# Patient Record
Sex: Male | Born: 1950
Health system: Southern US, Community
[De-identification: ages and names within clinical notes are randomized; demographics above are authoritative.]

## PROBLEM LIST (undated history)

## (undated) DIAGNOSIS — E785 Hyperlipidemia, unspecified: Secondary | ICD-10-CM

## (undated) DIAGNOSIS — R7989 Other specified abnormal findings of blood chemistry: Secondary | ICD-10-CM

## (undated) DIAGNOSIS — R972 Elevated prostate specific antigen [PSA]: Secondary | ICD-10-CM

## (undated) DIAGNOSIS — F1010 Alcohol abuse, uncomplicated: Secondary | ICD-10-CM

## (undated) DIAGNOSIS — M5430 Sciatica, unspecified side: Secondary | ICD-10-CM

## (undated) DIAGNOSIS — D649 Anemia, unspecified: Secondary | ICD-10-CM

## (undated) DIAGNOSIS — N529 Male erectile dysfunction, unspecified: Secondary | ICD-10-CM

## (undated) DIAGNOSIS — I10 Essential (primary) hypertension: Secondary | ICD-10-CM

## (undated) DIAGNOSIS — K219 Gastro-esophageal reflux disease without esophagitis: Secondary | ICD-10-CM

## (undated) DIAGNOSIS — K279 Peptic ulcer, site unspecified, unspecified as acute or chronic, without hemorrhage or perforation: Secondary | ICD-10-CM

## (undated) DIAGNOSIS — C61 Malignant neoplasm of prostate: Secondary | ICD-10-CM

## (undated) HISTORY — DX: Elevated prostate specific antigen (PSA): R97.20

## (undated) HISTORY — DX: Essential (primary) hypertension: I10

## (undated) HISTORY — DX: Male erectile dysfunction, unspecified: N52.9

## (undated) HISTORY — DX: Hyperlipidemia, unspecified: E78.5

## (undated) HISTORY — PX: PROSTATE BIOPSY: SHX241

---

## 1970-05-25 HISTORY — PX: TONSILLECTOMY: SUR1361

## 2003-05-26 DIAGNOSIS — N529 Male erectile dysfunction, unspecified: Secondary | ICD-10-CM | POA: Insufficient documentation

## 2003-05-26 HISTORY — PX: NASAL FRACTURE SURGERY: SHX718

## 2004-05-25 DIAGNOSIS — E782 Mixed hyperlipidemia: Secondary | ICD-10-CM | POA: Insufficient documentation

## 2004-05-25 DIAGNOSIS — E785 Hyperlipidemia, unspecified: Secondary | ICD-10-CM | POA: Insufficient documentation

## 2007-07-20 DIAGNOSIS — I10 Essential (primary) hypertension: Secondary | ICD-10-CM | POA: Insufficient documentation

## 2009-02-07 DIAGNOSIS — T6391XA Toxic effect of contact with unspecified venomous animal, accidental (unintentional), initial encounter: Secondary | ICD-10-CM | POA: Insufficient documentation

## 2009-02-26 DIAGNOSIS — D649 Anemia, unspecified: Secondary | ICD-10-CM | POA: Insufficient documentation

## 2013-04-24 ENCOUNTER — Ambulatory Visit: Payer: Self-pay | Admitting: Family Medicine

## 2014-10-30 LAB — CBC AND DIFFERENTIAL
HEMATOCRIT: 36 % — AB (ref 41–53)
Hemoglobin: 12.2 g/dL — AB (ref 13.5–17.5)
Platelets: 275 10*3/uL (ref 150–399)
WBC: 5.2 10^3/mL

## 2014-10-30 LAB — BASIC METABOLIC PANEL
BUN: 11 mg/dL (ref 4–21)
CREATININE: 0.9 mg/dL (ref <=1.3)
GLUCOSE: 97 mg/dL
POTASSIUM: 4.8 mmol/L (ref 3.4–5.3)
Sodium: 140 mmol/L (ref 137–147)

## 2014-10-30 LAB — LIPID PANEL
CHOLESTEROL: 217 mg/dL — AB (ref 0–200)
HDL: 85 mg/dL — AB (ref 35–70)
LDL Cholesterol: 111 mg/dL
TRIGLYCERIDES: 106 mg/dL (ref 40–160)

## 2014-11-02 ENCOUNTER — Telehealth: Payer: Self-pay | Admitting: *Deleted

## 2014-11-02 NOTE — Telephone Encounter (Signed)
Informed pt as below. Jason Holmes, CMA  

## 2014-11-02 NOTE — Telephone Encounter (Signed)
LMOVM for pt to return call 

## 2014-11-02 NOTE — Telephone Encounter (Signed)
-----   Message from Birdie Sons, MD sent at 11/01/2014 11:14 AM EDT ----- Regarding: Lab results Cholesterol is pretty well controlled at 217. Blood count kidney functions, and blood sugar are all good. Continue current medications. Check labs yearly.

## 2014-11-19 ENCOUNTER — Other Ambulatory Visit: Payer: Self-pay | Admitting: Family Medicine

## 2014-11-29 ENCOUNTER — Other Ambulatory Visit: Payer: Self-pay | Admitting: Family Medicine

## 2015-01-07 ENCOUNTER — Other Ambulatory Visit: Payer: Self-pay | Admitting: *Deleted

## 2015-01-07 NOTE — Telephone Encounter (Signed)
Received fax requesting rx's be resent to [facility].

## 2015-01-08 NOTE — Telephone Encounter (Signed)
Pt called you back. Pt confirmed that he does want to use [facility] mail order.  If you have any question please call him back.  Thanks, [employer]

## 2015-01-09 MED ORDER — SIMVASTATIN 20 MG PO TABS: 20.0000 mg | ORAL_TABLET | Freq: Every day | ORAL | Status: DC

## 2015-01-09 MED ORDER — HYDROCHLOROTHIAZIDE 25 MG PO TABS: 25.0000 mg | ORAL_TABLET | Freq: Every day | ORAL | Status: DC

## 2015-01-10 ENCOUNTER — Other Ambulatory Visit: Payer: Self-pay | Admitting: *Deleted

## 2015-01-10 MED ORDER — SIMVASTATIN 20 MG PO TABS: 20.0000 mg | ORAL_TABLET | Freq: Every day | ORAL | Status: DC

## 2015-01-10 MED ORDER — HYDROCHLOROTHIAZIDE 25 MG PO TABS: 25.0000 mg | ORAL_TABLET | Freq: Every day | ORAL | Status: DC

## 2015-01-10 NOTE — Telephone Encounter (Signed)
Per pt's request, resent rx's to [facility] mail order. rx's sent to [facility] were canceled.

## 2015-05-24 ENCOUNTER — Other Ambulatory Visit: Payer: Self-pay | Admitting: Family Medicine

## 2015-05-29 ENCOUNTER — Telehealth: Payer: Self-pay | Admitting: Family Medicine

## 2015-05-29 NOTE — Telephone Encounter (Signed)
Left detailed message on vm informing pt that request was received and rx was sent to [facility].

## 2015-05-29 NOTE — Telephone Encounter (Addendum)
Pt calling stating that [facility] faxed over a form for his Simvastatin and pt stating that [facility] hasn't heard back from his Dr about this and pt is wanting to know if Dr has even received it and is done with it.  Pt would like a return call on this please. CB# [phone].  Pt stated that we could leave a message on phone Thanks CC

## 2015-06-03 ENCOUNTER — Other Ambulatory Visit: Payer: Self-pay | Admitting: Family Medicine

## 2015-07-24 ENCOUNTER — Other Ambulatory Visit: Payer: Self-pay | Admitting: Family Medicine

## 2015-08-08 ENCOUNTER — Encounter: Payer: Self-pay | Admitting: Family Medicine

## 2015-08-08 ENCOUNTER — Ambulatory Visit (INDEPENDENT_AMBULATORY_CARE_PROVIDER_SITE_OTHER): Payer: Medicare PPO | Admitting: Family Medicine

## 2015-08-08 VITALS — BP 106/72 | HR 70 | Temp 98.6°F | Resp 16 | Wt 170.4 lb

## 2015-08-08 DIAGNOSIS — H6121 Impacted cerumen, right ear: Secondary | ICD-10-CM | POA: Diagnosis not present

## 2015-08-08 DIAGNOSIS — R002 Palpitations: Secondary | ICD-10-CM | POA: Insufficient documentation

## 2015-08-08 DIAGNOSIS — M5430 Sciatica, unspecified side: Secondary | ICD-10-CM | POA: Insufficient documentation

## 2015-08-08 NOTE — Patient Instructions (Signed)
Discussed minimal use of Q-tips.

## 2015-08-08 NOTE — Progress Notes (Signed)
Subjective:     Patient ID: Jason Holmes, male   DOB: 08-16-1950, 65 y.o.   MRN: [id number]  HPI  Chief Complaint  Patient presents with  . Ear Problem    Patient comes into office today with concerns of decreased hearing for the past 30 days in his right ear. Patient states that "it feels like water is in my ear," patient denies ringing, itching or pain in ear.   Denies precedent cold or allergy sx:" I feel like there is a bubble in my ear."   Review of Systems     Objective:   Physical Exam  Constitutional: He appears well-developed and well-nourished. No distress.  HENT:  Left ear with small amount of cerumen, TM intact Right ear with cerumen impaction: After irrigation per Kat ear canals are patent and T.M's intact. Patient reports improvement in his hearing.       Assessment:    1. Cerumen impaction, right - EAR CERUMEN REMOVAL    Plan:    Discussed minimal use of Q-tips.

## 2015-10-14 ENCOUNTER — Other Ambulatory Visit: Payer: Self-pay | Admitting: Family Medicine

## 2015-11-27 LAB — POC HEMOCCULT BLD/STL (HOME/3-CARD/SCREEN)

## 2015-12-22 ENCOUNTER — Other Ambulatory Visit: Payer: Self-pay | Admitting: Family Medicine

## 2015-12-25 ENCOUNTER — Encounter: Payer: Self-pay | Admitting: Family Medicine

## 2016-01-21 ENCOUNTER — Other Ambulatory Visit: Payer: Self-pay | Admitting: Family Medicine

## 2016-03-10 ENCOUNTER — Telehealth: Payer: Self-pay | Admitting: Family Medicine

## 2016-03-10 NOTE — Telephone Encounter (Signed)
Called Pt to schedule AWV with [facility]  for 10/26 - knb

## 2016-03-18 ENCOUNTER — Ambulatory Visit: Payer: Medicare PPO

## 2016-03-26 ENCOUNTER — Ambulatory Visit (INDEPENDENT_AMBULATORY_CARE_PROVIDER_SITE_OTHER): Payer: Medicare PPO | Admitting: Family Medicine

## 2016-03-26 VITALS — BP 115/64 | HR 60 | Temp 99.3°F | Ht <= 58 in | Wt 173.4 lb

## 2016-03-26 DIAGNOSIS — Z23 Encounter for immunization: Secondary | ICD-10-CM

## 2016-03-26 DIAGNOSIS — Z0000 Encounter for general adult medical examination without abnormal findings: Secondary | ICD-10-CM

## 2016-03-26 NOTE — Patient Instructions (Signed)
Mr. Duque , Thank you for taking time to come for your Medicare Wellness Visit. I appreciate your ongoing commitment to your health goals. Please review the following plan we discussed and let me know if I can assist you in the future.   These are the goals we discussed: Goals    . exercise/water          Starting 03/26/16, I will continue to exercise daily and drink 6-8 glasses of water a day.       This is a list of the screening recommended for you and due dates:  Health Maintenance  Topic Date Due  . Colon Cancer Screening  05/24/2016*  .  Hepatitis C: One time screening is recommended by Center for Disease Control  (CDC) for  adults born from [birth year] through 1967.   05/24/2016*  . Tetanus Vaccine  03/26/2026*  . Pneumonia vaccines (2 of 2 - PPSV23) 03/26/2017  . Flu Shot  Completed  . Shingles Vaccine  Completed  *Topic was postponed. The date shown is not the original due date.   Preventive Care for Adults  A healthy lifestyle and preventive care can promote health and wellness. Preventive health guidelines for adults include the following key practices.  . A routine yearly physical is a good way to check with your health care provider about your health and preventive screening. It is a chance to share any concerns and updates on your health and to receive a thorough exam.  . Visit your dentist for a routine exam and preventive care every 6 months. Brush your teeth twice a day and floss once a day. Good oral hygiene prevents tooth decay and gum disease.  . The frequency of eye exams is based on your age, health, family medical history, use  of contact lenses, and other factors. Follow your health care provider's ecommendations for frequency of eye exams.  . Eat a healthy diet. Foods like vegetables, fruits, whole grains, low-fat dairy products, and lean protein foods contain the nutrients you need without too many calories. Decrease your intake of foods high in solid fats,  added sugars, and salt. Eat the right amount of calories for you. Get information about a proper diet from your health care provider, if necessary.  . Regular physical exercise is one of the most important things you can do for your health. Most adults should get at least 150 minutes of moderate-intensity exercise (any activity that increases your heart rate and causes you to sweat) each week. In addition, most adults need muscle-strengthening exercises on 2 or more days a week.  Silver Sneakers may be a benefit available to you. To determine eligibility, you may visit the website: www.silversneakers.com or contact program at [phone] Mon-Fri between 8AM-8PM.   . Maintain a healthy weight. The body mass index (BMI) is a screening tool to identify possible weight problems. It provides an estimate of body fat based on height and weight. Your health care provider can find your BMI and can help you achieve or maintain a healthy weight.   For adults 20 years and older: ? A BMI below 18.5 is considered underweight. ? A BMI of 18.5 to 24.9 is normal. ? A BMI of 25 to 29.9 is considered overweight. ? A BMI of 30 and above is considered obese.   . Maintain normal blood lipids and cholesterol levels by exercising and minimizing your intake of saturated fat. Eat a balanced diet with plenty of fruit and vegetables. Blood tests for lipids  and cholesterol should begin at age 65 and be repeated every 5 years. If your lipid or cholesterol levels are high, you are over 50, or you are at high risk for heart disease, you may need your cholesterol levels checked more frequently. Ongoing high lipid and cholesterol levels should be treated with medicines if diet and exercise are not working.  . If you smoke, find out from your health care provider how to quit. If you do not use tobacco, please do not start.  . If you choose to drink alcohol, please do not consume more than 2 drinks per day. One drink is  considered to be 12 ounces (355 mL) of beer, 5 ounces (148 mL) of wine, or 1.5 ounces (44 mL) of liquor.  . If you are 31-68 years old, ask your health care provider if you should take aspirin to prevent strokes.  . Use sunscreen. Apply sunscreen liberally and repeatedly throughout the day. You should seek shade when your shadow is shorter than you. Protect yourself by wearing Connelly sleeves, pants, a wide-brimmed hat, and sunglasses year round, whenever you are outdoors.  . Once a month, do a whole body skin exam, using a mirror to look at the skin on your back. Tell your health care provider of new moles, moles that have irregular borders, moles that are larger than a pencil eraser, or moles that have changed in shape or color.

## 2016-03-26 NOTE — Progress Notes (Signed)
Subjective:   Jason Holmes is a 65 y.o. male who presents for Medicare Annual/Subsequent preventive examination.  Review of Systems:  N/A Cardiac Risk Factors include: advanced age (>46men, >73 women);dyslipidemia;hypertension;male gender;smoking/ tobacco exposure     Objective:    Vitals: BP 115/64 (BP Location: Right Arm)   Pulse 60   Temp 99.3 F (37.4 C) (Oral)   Ht 4' 7.75" (1.416 m)   Wt 173 lb 6 oz (78.6 kg)   BMI 39.22 kg/m   Body mass index is 39.22 kg/m.  Tobacco History  Smoking Status  . 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 Erectile dysfunction   . Hyperlipidemia   . Hypertension    History reviewed. No pertinent surgical history. Family History  Problem Relation Age of Onset  . Heart attack Mother   . Heart attack Father    History  Sexual Activity  . Sexual activity: Not on file    Outpatient Encounter Prescriptions as of 03/26/2016  Medication Sig  . aspirin 81 MG tablet   . Glucosamine HCl 1000 MG TABS Take by mouth.  . hydrochlorothiazide (HYDRODIURIL) 25 MG tablet TAKE 1 TABLET EVERY DAY  . MULTIPLE VITAMIN PO Take by mouth daily.  . sildenafil (VIAGRA) 25 MG tablet Take 25 mg by mouth daily as needed for erectile dysfunction.  . simvastatin (ZOCOR) 20 MG tablet TAKE 1 TABLET AT BEDTIME   No facility-administered encounter medications on file as of 03/26/2016.     Activities of Daily Living In your present state of health, do you have any difficulty performing the following activities: 03/26/2016  Hearing? Y  Vision? N  Difficulty concentrating or making decisions? N  Walking or climbing stairs? N  Dressing or bathing? N  Doing errands, shopping? N  Preparing Food and eating ? N  Using the Toilet? N  In the past six months, have you accidently leaked urine? N  Do you have problems with loss of  bowel control? N  Managing your Medications? N  Managing your Finances? N  Housekeeping or managing your Housekeeping? N  Some recent data might be hidden    Patient Care Team: Birdie Sons, MD as PCP - General (Family Medicine) Allyn Kenner, MD as Consulting Physician (Dermatology)   Assessment:     Exercise Activities and Dietary recommendations Current Exercise Habits: Home exercise routine, Type of exercise: walking, Time (Minutes): 30, Frequency (Times/Week): 7, Weekly Exercise (Minutes/Week): 210, Intensity: Mild  Goals    . exercise/water          Starting 03/26/16, I will continue to exercise daily and drink 6-8 glasses of water a day.      Fall Risk Fall Risk  03/26/2016  Falls in the past year? No   Depression Screen PHQ 2/9 Scores 03/26/2016  PHQ - 2 Score 0    Cognitive Function     6CIT Screen 03/26/2016  What Year? 0 points  What month? 0 points  What time? 0 points  Count back from 20 0 points  Months in reverse 0 points  Repeat phrase 2 points  Total Score 2    There is no immunization history for the selected administration types on file for this patient. Screening Tests Health Maintenance  Topic Date Due  . COLONOSCOPY  05/24/2016 (Originally 01/04/1999)  . Hepatitis C  Screening  05/24/2016 (Originally [birth year])  . TETANUS/TDAP  03/26/2026 (Originally 01/04/1968)  . PNA vac Low Risk Adult (2 of 2 - PPSV23) 03/26/2017  . INFLUENZA VACCINE  Completed  . ZOSTAVAX  Completed      I have personally reviewed and addressed the Medicare Annual Wellness questionnaire and have noted the following in the patient's chart:  A. Medical and social history B. Use of alcohol, tobacco or illicit drugs  C. Current medications and supplements D. Functional ability and status E.  Nutritional status F.  Physical activity G. Advance directives H. List of other physicians I.  Hospitalizations, surgeries, and ER visits in previous 12 months J.   [place] such as hearing and vision if needed, cognitive and depression L. Referrals and appointments - none  In addition, I have reviewed and discussed with patient certain preventive protocols, quality metrics, and best practice recommendations. A written personalized care plan for preventive services as well as general preventive health recommendations were provided to patient.  See attached scanned questionnaire for additional information.   Signed,  Fabio Neighbors, LPN Nurse Health Advisor  I have reviewed the health advisor's note, was available for consultation, and agree with documentation and plan  Lelon Huh, MD

## 2016-03-27 DIAGNOSIS — Z23 Encounter for immunization: Secondary | ICD-10-CM | POA: Diagnosis not present

## 2016-04-27 ENCOUNTER — Other Ambulatory Visit: Payer: Self-pay | Admitting: Family Medicine

## 2016-04-29 ENCOUNTER — Other Ambulatory Visit: Payer: Self-pay | Admitting: *Deleted

## 2016-04-29 MED ORDER — SILDENAFIL CITRATE 100 MG PO TABS: 100.0000 mg | ORAL_TABLET | Freq: Every day | ORAL | 4 refills | Status: DC | PRN

## 2016-04-30 ENCOUNTER — Other Ambulatory Visit: Payer: Self-pay | Admitting: Family Medicine

## 2016-04-30 ENCOUNTER — Telehealth: Payer: Self-pay | Admitting: Family Medicine

## 2016-05-06 ENCOUNTER — Encounter: Payer: Self-pay | Admitting: Family Medicine

## 2016-05-06 ENCOUNTER — Ambulatory Visit (INDEPENDENT_AMBULATORY_CARE_PROVIDER_SITE_OTHER): Payer: Medicare PPO | Admitting: Family Medicine

## 2016-05-06 VITALS — BP 120/64 | HR 72 | Temp 98.6°F | Resp 16 | Wt 172.0 lb

## 2016-05-06 DIAGNOSIS — Z0000 Encounter for general adult medical examination without abnormal findings: Secondary | ICD-10-CM | POA: Diagnosis not present

## 2016-05-06 DIAGNOSIS — Z125 Encounter for screening for malignant neoplasm of prostate: Secondary | ICD-10-CM

## 2016-05-06 DIAGNOSIS — F1010 Alcohol abuse, uncomplicated: Secondary | ICD-10-CM | POA: Diagnosis not present

## 2016-05-06 DIAGNOSIS — Z1159 Encounter for screening for other viral diseases: Secondary | ICD-10-CM

## 2016-05-06 DIAGNOSIS — I10 Essential (primary) hypertension: Secondary | ICD-10-CM

## 2016-05-06 DIAGNOSIS — E782 Mixed hyperlipidemia: Secondary | ICD-10-CM

## 2016-05-06 NOTE — Patient Instructions (Addendum)
Please limit alcohol consumption to an average of no more than two servings per day  You will be due for colon screening in July 2018. I would recommend ColoGuard which is a stool test that only has to be done every three years. You can call our office in July and we will order it for you.    Alcohol Overuse  Alcohol use disorder is when your drinking disrupts your daily life. When you have this condition, you drink too much alcohol and you cannot control your drinking. Alcohol use disorder can cause serious problems with your physical health. It can affect your brain, heart, liver, pancreas, immune system, stomach, and intestines. Alcohol use disorder can increase your risk for certain cancers and cause problems with your mental health, such as depression, anxiety, psychosis, delirium, and dementia. People with this disorder risk hurting themselves and others. What are the causes? This condition is caused by drinking too much alcohol over time. It is not caused by drinking too much alcohol only one or two times. Some people with this condition drink alcohol to cope with or escape from negative life events. Others drink to relieve pain or symptoms of mental illness. What increases the risk? You are more likely to develop this condition if:  You have a family history of alcohol use disorder.  Your culture encourages drinking to the point of intoxication, or makes alcohol easy to get.  You had a mood or conduct disorder in childhood.  You have been a victim of abuse.  You are an adolescent and:  You have poor grades or difficulties in school.  Your caregivers do not talk to you about saying no to alcohol, or supervise your activities.  You are impulsive or you have trouble with self-control. What are the signs or symptoms? Symptoms of this condition include:  Drinkingmore than you want to.  Drinking for longer than you want to.  Trying several times to drink less or to control  your drinking.  Spending a lot of time getting alcohol, drinking, or recovering from drinking.  Craving alcohol.  Having problems at work, at school, or at home due to drinking.  Having problems in relationships due to drinking.  Drinking when it is dangerous to drink, such as before driving a car.  Continuing to drink even though you know you might have a physical or mental problem related to drinking.  Needing more and more alcohol to get the same effect you want from the alcohol (building up tolerance).  Having symptoms of withdrawal when you stop drinking. Symptoms of withdrawal include:  Fatigue.  Nightmares.  Trouble sleeping.  Depression.  Anxiety.  Fever.  Seizures.  Severe confusion.  Feeling or seeing things that are not there (hallucinations).  Tremors.  Rapid heart rate.  Rapid breathing.  High blood pressure.  Drinking to avoid symptoms of withdrawal. How is this diagnosed? This condition is diagnosed with an assessment. Your health care provider may start the assessment by asking three or four questions about your drinking. Your health care provider may perform a physical exam or do lab tests to see if you have physical problems resulting from alcohol use. She or he may refer you to a mental health professional for evaluation. How is this treated? Some people with alcohol use disorder are able to reduce their alcohol use to low-risk levels. Others need to completely quit drinking alcohol. When necessary, mental health professionals with specialized training in substance use treatment can help. Your health  care provider can help you decide how severe your alcohol use disorder is and what type of treatment you need. The following forms of treatment are available:  Detoxification. Detoxification involves quitting drinking and using prescription medicines within the first week to help lessen withdrawal symptoms. This treatment is important for people who  have had withdrawal symptoms before and for heavy drinkers who are likely to have withdrawal symptoms. Alcohol withdrawal can be dangerous, and in severe cases, it can cause death. Detoxification may be provided in a home, community, or primary care setting, or in a hospital or substance use treatment facility.  Counseling. This treatment is also called talk therapy. It is provided by substance use treatment counselors. A counselor can address the reasons you use alcohol and suggest ways to keep you from drinking again or to prevent problem drinking. The goals of talk therapy are to:  Find healthy activities and ways for you to cope with stress.  Identify and avoid the things that trigger your alcohol use.  Help you learn how to handle cravings.  Medicines.Medicines can help treat alcohol use disorder by:  Decreasing alcohol cravings.  Decreasing the positive feeling you have when you drink alcohol.  Causing an uncomfortable physical reaction when you drink alcohol (aversion therapy).  Support groups. Support groups are led by people who have quit drinking. They provide emotional support, advice, and guidance. These forms of treatment are often combined. Some people with this condition benefit from a combination of treatments provided by specialized substance use treatment centers. Follow these instructions at home:  Take over-the-counter and prescription medicines only as told by your health care provider.  Check with your health care provider before starting any new medicines.  Ask friends and family members not to offer you alcohol.  Avoid situations where alcohol is served, including gatherings where others are drinking alcohol.  Create a plan for what to do when you are tempted to use alcohol.  Find hobbies or activities that you enjoy that do not include alcohol.  Keep all follow-up visits as told by your health care provider. This is important. How is this prevented?  If  you drink, limit alcohol intake to no more than 1 drink a day for nonpregnant women and 2 drinks a day for men. One drink equals 12 oz of beer, 5 oz of wine, or 1 oz of hard liquor.  If you have a mental health condition, get treatment and support.  Do not give alcohol to adolescents.  If you are an adolescent:  Do not drink alcohol.  Do not be afraid to say no if someone offers you alcohol. Speak up about why you do not want to drink. You can be a positive role model for your friends and set a good example for those around you by not drinking alcohol.  If your friends drink, spend time with others who do not drink alcohol. Make new friends who do not use alcohol.  Find healthy ways to manage stress and emotions, such as meditation or deep breathing, exercise, spending time in nature, listening to music, or talking with a trusted friend or family member. Contact a health care provider if:  You are not able to take your medicines as told.  Your symptoms get worse.  You return to drinking alcohol (relapse) and your symptoms get worse. Get help right away if:  You have thoughts about hurting yourself or others. If you ever feel like you may hurt yourself or others, or have  thoughts about taking your own life, get help right away. You can go to your nearest emergency department or call:  Your local emergency services (911 in the [place]  A suicide crisis helpline, such as the [facility] at 253-632-8104. This is open 24 hours a day. Summary  Alcohol use disorder is when your drinking disrupts your daily life. When you have this condition, you drink too much alcohol and you cannot control your drinking.  Treatment may include detoxification, counseling, medicine, and support groups.  Ask friends and family members not to offer you alcohol. Avoid situations where alcohol is served.  Get help right away if you have thoughts about hurting yourself or  others. This information is not intended to replace advice given to you by your health care provider. Make sure you discuss any questions you have with your health care provider. Document Released: 06/18/2004 Document Revised: 02/06/2016 Document Reviewed: 02/06/2016 Elsevier Interactive Patient Education  2017 [employer].

## 2016-05-06 NOTE — Progress Notes (Signed)
Patient: Jason Holmes Male    DOB: 21-Jul-1950   65 y.o.   MRN: [id number] Visit Date: 05/06/2016  Today's Provider: Lelon Huh, MD   Chief Complaint  Patient presents with  . Annual Exam  . Hypertension  . Anemia   Subjective:    HPI  Patient present today for yearly physical and follow up of hypertension and high cholesterol. He feels well with no complaints at al. He exercises regularly, has a good appetitie, and has no trouble sleeping or resting at night.    Hypertension, follow-up:  BP Readings from Last 3 Encounters:  05/06/16 120/64  03/26/16 115/64  08/08/15 106/72    He was last seen for hypertension 10/16/2014.  BP at that visit was 110/82. Management since that visit includes; checked labs, no changes.He reports good compliance with treatment. He is not having side effects. none He is exercising. He is adherent to low salt diet.   Outside blood pressures are 120/80. He is experiencing none.  Patient denies none.   Cardiovascular risk factors include none.  Use of agents associated with hypertension: none.   ----------------------------------------------------------------    Lipid/Cholesterol, Follow-up:   Last seen for this 10/16/2014.  Management since that visit includes;checked labs, no changes.  Last Lipid Panel:    Component Value Date/Time   CHOL 217 (A) 10/30/2014   TRIG 106 10/30/2014   HDL 85 (A) 10/30/2014   LDLCALC 111 10/30/2014    He reports good compliance with treatment. He is not having side effects. none  Wt Readings from Last 3 Encounters:  05/06/16 172 lb (78 kg)  03/26/16 173 lb 6 oz (78.6 kg)  08/08/15 170 lb 6.4 oz (77.3 kg)    ----------------------------------------------------------------  Allergies  Allergen Reactions  . Lovastatin      Current Outpatient Prescriptions:  .  aspirin 81 MG tablet, , Disp: , Rfl:  .  Glucosamine HCl 1000 MG TABS, Take by mouth., Disp: , Rfl:  .   hydrochlorothiazide (HYDRODIURIL) 25 MG tablet, TAKE 1 TABLET EVERY DAY, Disp: 90 tablet, Rfl: 1 .  MULTIPLE VITAMIN PO, Take by mouth daily., Disp: , Rfl:  .  sildenafil (VIAGRA) 100 MG tablet, Take 1 tablet (100 mg total) by mouth daily as needed for erectile dysfunction., Disp: 88 tablet, Rfl: 4 .  simvastatin (ZOCOR) 20 MG tablet, TAKE 1 TABLET AT BEDTIME (NEED MD APPOINTMENT), Disp: 90 tablet, Rfl: 0  Review of Systems  Constitutional: Negative.   HENT: Positive for tinnitus.   Eyes: Negative.   Respiratory: Negative.   Cardiovascular: Negative.   Gastrointestinal: Negative.   Endocrine: Negative.   Genitourinary: Negative.   Musculoskeletal: Positive for arthralgias and back pain.  Skin: Negative.   Allergic/Immunologic: Negative.   Neurological: Negative.   Hematological: Negative.   Psychiatric/Behavioral: Negative.     Social History  Substance Use Topics  . Smoking status: Former Research scientist (life sciences)  . Smokeless tobacco: Current User    Types: Chew  . Alcohol use 42.0 oz/week    70 Cans of beer per week   Objective:   BP 120/64 (BP Location: Right Arm, Patient Position: Sitting, Cuff Size: Normal)   Pulse 72   Temp 98.6 F (37 C) (Oral)   Resp 16   Ht 4\' 8"  (1.422 m)   Wt 172 lb (78 kg)   SpO2 97%   BMI 38.56 kg/m    Audit-C Alcohol Use Screening  Question Answer Points  How often do you have alcoholic  drink? 4 or more times weekly 4  On days you do drink alcohol, how many drinks do you typically consume? 7 to 9 3  How oftey will you drink 6 or more in a total? 4 times weekly 4  Total Score:  11   A score of 3 or more in women, and 4 or more in men indicates increased risk for alcohol abuse, EXCEPT if all of the points are from question 1.    Physical Exam   General Appearance:    Alert, cooperative, no distress, appears stated age  Head:    Normocephalic, without obvious abnormality, atraumatic  Eyes:    PERRL, conjunctiva/corneas clear, EOM's intact, fundi     benign, both eyes       Ears:    Normal TM's and external ear canals, both ears  Nose:   Nares normal, septum midline, mucosa normal, no drainage   or sinus tenderness  Throat:   Lips, mucosa, and tongue normal; teeth and gums normal  Neck:   Supple, symmetrical, trachea midline, no adenopathy;       thyroid:  No enlargement/tenderness/nodules; no carotid   bruit or JVD  Back:     Symmetric, no curvature, ROM normal, no CVA tenderness  Lungs:     Clear to auscultation bilaterally, respirations unlabored  Chest wall:    No tenderness or deformity  Heart:    Regular rate and rhythm, S1 and S2 normal, no murmur, rub   or gallop  Abdomen:     Soft, non-tender, bowel sounds active all four quadrants,    no masses, no organomegaly  Genitalia:    deferred  Rectal:    deferred  Extremities:   Extremities normal, atraumatic, no cyanosis or edema  Pulses:   2+ and symmetric all extremities  Skin:   Skin color, texture, turgor normal, no rashes or lesions  Lymph nodes:   Cervical, supraclavicular, and axillary nodes normal  Neurologic:   CNII-XII intact. Normal strength, sensation and reflexes      throughout       Assessment & Plan:     1. Annual physical exam  - EKG 12-Lead - CBC - Comprehensive metabolic panel - Lipid panel   2. Essential (primary) hypertension Well controlled.  - EKG 12-Lead - Renal function panel  3. Combined fat and carbohydrate induced hyperlipemia He is tolerating simvastatin well with no adverse effects.   - EKG 12-Lead - Hepatic function panel  4. Need for hepatitis C screening test  - Hepatitis C antibody  5. Prostate cancer screening PSA  6. Excessive drinking alcohol He states he has not always been a heavy drinker, but since he got married last year he has gotten in the habit of drinking beer with his dinner every night, and 3 or 4 more after dinner. Counseled of numerous health risks of consuming more than than three serving of alcohol every  day. He states he will cut back to one or two, or stop completely if this is difficult.   - CBC - Comprehensive metabolic panel       Lelon Huh, MD  [facility]

## 2016-05-13 ENCOUNTER — Telehealth: Payer: Self-pay

## 2016-05-13 ENCOUNTER — Other Ambulatory Visit: Payer: Self-pay | Admitting: Family Medicine

## 2016-05-13 DIAGNOSIS — R972 Elevated prostate specific antigen [PSA]: Secondary | ICD-10-CM | POA: Insufficient documentation

## 2016-05-13 LAB — COMPREHENSIVE METABOLIC PANEL
ALBUMIN: 4.9 g/dL — AB (ref 3.6–4.8)
ALK PHOS: 55 IU/L (ref 39–117)
ALT: 22 IU/L (ref 0–44)
AST: 34 IU/L (ref 0–40)
Albumin/Globulin Ratio: 2.2 (ref 1.2–2.2)
BUN / CREAT RATIO: 13 (ref 10–24)
BUN: 11 mg/dL (ref 8–27)
Bilirubin Total: 0.5 mg/dL (ref 0.0–1.2)
CO2: 26 mmol/L (ref 18–29)
CREATININE: 0.82 mg/dL (ref 0.76–1.27)
Calcium: 10.4 mg/dL — ABNORMAL HIGH (ref 8.6–10.2)
Chloride: 96 mmol/L (ref 96–106)
GFR calc Af Amer: 106 mL/min/{1.73_m2} (ref >59)
GFR calc non Af Amer: 92 mL/min/{1.73_m2} (ref >59)
GLUCOSE: 95 mg/dL (ref 65–99)
Globulin, Total: 2.2 g/dL (ref 1.5–4.5)
Potassium: 5.2 mmol/L (ref 3.5–5.2)
Sodium: 138 mmol/L (ref 134–144)
Total Protein: 7.1 g/dL (ref 6.0–8.5)

## 2016-05-13 LAB — CBC
Hematocrit: 36.7 % — ABNORMAL LOW (ref 37.5–51.0)
Hemoglobin: 12.8 g/dL — ABNORMAL LOW (ref 13.0–17.7)
MCH: 30.8 pg (ref 26.6–33.0)
MCHC: 34.9 g/dL (ref 31.5–35.7)
MCV: 88 fL (ref 79–97)
PLATELETS: 274 10*3/uL (ref 150–379)
RBC: 4.16 x10E6/uL (ref 4.14–5.80)
RDW: 13.4 % (ref 12.3–15.4)
WBC: 4.2 10*3/uL (ref 3.4–10.8)

## 2016-05-13 LAB — PSA: Prostate Specific Ag, Serum: 6.9 ng/mL — ABNORMAL HIGH (ref 0.0–4.0)

## 2016-05-13 LAB — HEPATITIS C ANTIBODY: Hep C Virus Ab: 0.2 s/co ratio (ref 0.0–0.9)

## 2016-05-13 LAB — LIPID PANEL
CHOLESTEROL TOTAL: 199 mg/dL (ref 100–199)
Chol/HDL Ratio: 2 ratio units (ref 0.0–5.0)
HDL: 99 mg/dL (ref >39)
LDL CALC: 91 mg/dL (ref 0–99)
TRIGLYCERIDES: 43 mg/dL (ref 0–149)
VLDL CHOLESTEROL CAL: 9 mg/dL (ref 5–40)

## 2016-05-13 NOTE — Telephone Encounter (Signed)
Patient advised as below. Patient verbalizes understanding and is in agreement with treatment plan.  

## 2016-05-13 NOTE — Telephone Encounter (Signed)
-----   Message from Birdie Sons, MD sent at 05/13/2016  7:59 AM EST ----- PSA is elevated. Need to recheck PSA and free PSA in 3-4 weeks. Future order is in Epic. Cholesterol is good at 199. Normal kidney functions, blood count and electrolytes.

## 2016-06-09 ENCOUNTER — Other Ambulatory Visit: Payer: Self-pay | Admitting: Family Medicine

## 2016-06-15 ENCOUNTER — Telehealth: Payer: Self-pay | Admitting: Family Medicine

## 2016-06-15 DIAGNOSIS — R972 Elevated prostate specific antigen [PSA]: Secondary | ICD-10-CM

## 2016-06-15 NOTE — Telephone Encounter (Signed)
Pt is requesting a lab slip to recheck labs from his CPE in December.  [id number]

## 2016-06-15 NOTE — Telephone Encounter (Signed)
PSA order released and requisition printed. LM informing pt. Renaldo Fiddler, CMA

## 2016-06-18 ENCOUNTER — Telehealth: Payer: Self-pay

## 2016-06-18 DIAGNOSIS — R972 Elevated prostate specific antigen [PSA]: Secondary | ICD-10-CM

## 2016-06-18 LAB — PSA, TOTAL AND FREE
PSA, Free Pct: 16.8 %
PSA, Free: 1.11 ng/mL
Prostate Specific Ag, Serum: 6.6 ng/mL — ABNORMAL HIGH (ref 0.0–4.0)

## 2016-06-18 NOTE — Telephone Encounter (Signed)
Patient advised as directed below. Per patient if we can do the Urology here in the same building.  Thanks,  -Leonette Tischer

## 2016-06-18 NOTE — Telephone Encounter (Signed)
-----   Message from Birdie Sons, MD sent at 06/18/2016  7:55 AM EST ----- PSA is still elevated. Need referral to urology for further evaluation.

## 2016-07-06 ENCOUNTER — Ambulatory Visit (INDEPENDENT_AMBULATORY_CARE_PROVIDER_SITE_OTHER): Payer: Medicare PPO | Admitting: Urology

## 2016-07-06 ENCOUNTER — Encounter: Payer: Self-pay | Admitting: Urology

## 2016-07-06 VITALS — BP 147/83 | HR 79 | Ht 66.0 in | Wt 172.0 lb

## 2016-07-06 DIAGNOSIS — N529 Male erectile dysfunction, unspecified: Secondary | ICD-10-CM

## 2016-07-06 DIAGNOSIS — N402 Nodular prostate without lower urinary tract symptoms: Secondary | ICD-10-CM | POA: Diagnosis not present

## 2016-07-06 DIAGNOSIS — R972 Elevated prostate specific antigen [PSA]: Secondary | ICD-10-CM

## 2016-07-06 DIAGNOSIS — N138 Other obstructive and reflux uropathy: Secondary | ICD-10-CM

## 2016-07-06 DIAGNOSIS — N401 Enlarged prostate with lower urinary tract symptoms: Secondary | ICD-10-CM

## 2016-07-06 LAB — BLADDER SCAN AMB NON-IMAGING

## 2016-07-06 NOTE — Progress Notes (Signed)
07/06/2016 3:26 PM   GENTLE BOGLE [birth year] [id number]  Referring provider: Birdie Sons, MD [address] [place], [place] [address]  Chief Complaint  Patient presents with  . Elevated PSA    New Patient    HPI: Patient is a 66 year old Caucasian male who is referred to [place] for elevated PSA by Dr. Caryn Section.  Patient was found to have a PSA of 6.6 on 06/17/2016.  PSA, free from that blood work was 1.11 noting a 23% probability of having prostate cancer.  I do not have previous PSA's available to me at this visit.    His IPSS score today is 2, which is mild lower urinary tract symptomatology. He is delighted with his quality life due to his urinary symptoms. His PVR is 62 mL.  His UA today was unremarkable.    His major complaint today is nocturia x 2.  He has had these symptoms for ten years.  He denies any dysuria, hematuria or suprapubic pain.   He also denies any recent fevers, chills, nausea or vomiting.  He does not have a family history of PCa.      IPSS    Row Name 07/06/16 1400         International Prostate Symptom Score   How often have you had the sensation of not emptying your bladder? Not at All     How often have you had to urinate less than every two hours? Not at All     How often have you found you stopped and started again several times when you urinated? Not at All     How often have you found it difficult to postpone urination? Not at All     How often have you had a weak urinary stream? Not at All     How often have you had to strain to start urination? Not at All     How many times did you typically get up at night to urinate? 2 Times     Total IPSS Score 2       Quality of Life due to urinary symptoms   If you were to spend the rest of your life with your urinary condition just the way it is now how would you feel about that? Delighted        Score:  1-7 Mild 8-19 Moderate 20-35 Severe   Erectile dysfunction His SHIM  score is 24, which is no ED.   He has been having difficulty with erections for 20 years.   His major complaint is maintaining and erection.  His libido is preserved.   His risk factors for ED are age, BPH, HTN, HLD, alcohol abuse, smoking, antidepressants and blood pressure medications.  He denies any painful erections or curvatures with his erections.   He has tried Viagra in the past.       [place] Name 07/06/16 1515         SHIM: Over the last 6 months:   How do you rate your confidence that you could get and keep an erection? High     When you had erections with sexual stimulation, how often were your erections hard enough for penetration (entering your partner)? Almost Always or Always     During sexual intercourse, how often were you able to maintain your erection after you had penetrated (entered) your partner? Not Difficult     During sexual intercourse, how difficult  was it to maintain your erection to completion of intercourse? Not Difficult     When you attempted sexual intercourse, how often was it satisfactory for you? Not Difficult       SHIM Total Score   SHIM 24        Score: 1-7 Severe ED 8-11 Moderate ED 12-16 Mild-Moderate ED 17-21 Mild ED 22-25 No ED  PMH: Past Medical History:  Diagnosis Date  . Erectile dysfunction   . Hyperlipidemia   . Hypertension     Surgical History: Past Surgical History:  Procedure Laterality Date  . NASAL FRACTURE SURGERY  2005  . TONSILLECTOMY  1972    Home Medications:  Allergies as of 07/06/2016      Reactions   Lovastatin       Medication List       Accurate as of 07/06/16  3:26 PM. Always use your most recent med list.          aspirin 81 MG tablet   Glucosamine HCl 1000 MG Tabs Take by mouth.   hydrochlorothiazide 25 MG tablet Commonly known as:  HYDRODIURIL TAKE 1 TABLET EVERY DAY   MULTIPLE VITAMIN PO Take by mouth daily.   sildenafil 100 MG tablet Commonly known as:  VIAGRA Take 1 tablet  (100 mg total) by mouth daily as needed for erectile dysfunction.   simvastatin 20 MG tablet Commonly known as:  ZOCOR TAKE 1 TABLET AT BEDTIME (NEED MD APPOINTMENT)       Allergies:  Allergies  Allergen Reactions  . Lovastatin     Family History: Family History  Problem Relation Age of Onset  . Heart attack Mother   . Heart attack Father   . Prostate cancer Neg Hx   . Kidney cancer Neg Hx     Social History:  reports that he has quit smoking. His smokeless tobacco use includes Chew. He reports that he drinks about 42.0 oz of alcohol per week . He reports that he does not use drugs.  ROS: UROLOGY Frequent Urination?: No Hard to postpone urination?: No Burning/pain with urination?: No Get up at night to urinate?: Yes Leakage of urine?: No Urine stream starts and stops?: No Trouble starting stream?: No Do you have to strain to urinate?: No Blood in urine?: No Urinary tract infection?: No Sexually transmitted disease?: No Injury to kidneys or bladder?: No Painful intercourse?: No Weak stream?: No Erection problems?: Yes Penile pain?: No  Gastrointestinal Nausea?: No Vomiting?: No Indigestion/heartburn?: No Diarrhea?: No Constipation?: No  Constitutional Fever: No Night sweats?: No Weight loss?: No Fatigue?: No  Skin Skin rash/lesions?: Yes Itching?: Yes  Eyes Blurred vision?: Yes Double vision?: No  Ears/Nose/Throat Sore throat?: No Sinus problems?: No  Hematologic/Lymphatic Swollen glands?: No Easy bruising?: No  Cardiovascular Leg swelling?: No Chest pain?: No  Respiratory Cough?: No Shortness of breath?: No  Endocrine Excessive thirst?: No  Musculoskeletal Back pain?: Yes Joint pain?: Yes  Neurological Headaches?: No Dizziness?: No  Psychologic Depression?: No Anxiety?: No  Physical Exam: BP (!) 147/83   Pulse 79   Ht 5\' 6"  (1.676 m)   Wt 172 lb (78 kg)   BMI 27.76 kg/m   Constitutional: Well nourished. Alert and  oriented, No acute distress. HEENT: [place] AT, moist mucus membranes. Trachea midline, no masses. Cardiovascular: No clubbing, cyanosis, or edema. Respiratory: Normal respiratory effort, no increased work of breathing. GI: Abdomen is soft, non tender, non distended, no abdominal masses. Liver and spleen not palpable.  No hernias  appreciated.  Stool sample for occult testing is not indicated.   GU: No CVA tenderness.  No bladder fullness or masses.  Patient with circumcised phallus.  Urethral meatus is patent.  No penile discharge. No penile lesions or rashes. Scrotum without lesions, cysts, rashes and/or edema.  Testicles are located scrotally bilaterally. No masses are appreciated in the testicles. Left and right epididymis are normal. Rectal: Patient with  normal sphincter tone. Anus and perineum without scarring or rashes. No rectal masses are appreciated.  Prostate is approximately 50 grams, two nodules are appreciated, one in the right apex (1 x  1 cm) and one on the mid left side (3 x 5 mm)  Seminal vesicles are normal. Skin: No rashes, bruises or suspicious lesions. Lymph: No cervical or inguinal adenopathy. Neurologic: Grossly intact, no focal deficits, moving all 4 extremities. Psychiatric: Normal mood and affect.   Laboratory Data: Lab Results  Component Value Date   WBC 4.2 05/12/2016   HGB 12.2 (A) 10/30/2014   HCT 36.7 (L) 05/12/2016   MCV 88 05/12/2016   PLT 274 05/12/2016    Lab Results  Component Value Date   CREATININE 0.82 05/12/2016    PSA History  6.6 ng/mL on 06/17/2016  PSA, free 1.11 - 23% probability of prostate cancer     Component Value Date/Time   CHOL 199 05/12/2016 0954   HDL 99 05/12/2016 0954   CHOLHDL 2.0 05/12/2016 0954   LDLCALC 91 05/12/2016 0954    Lab Results  Component Value Date   AST 34 05/12/2016   Lab Results  Component Value Date   ALT 22 05/12/2016    Pertinent Imaging: Results for AARRON, MCCRILLIS (MRN [id number]) as of  07/06/2016 15:13  Ref. Range 07/06/2016 14:54  Scan Result Unknown 15ml    Assessment & Plan:   1. Elevated PSA  - I discussed with the patient that PSA is an acronym for  prostate specific antigen,  which is a protein made by the prostate gland and can be detected in the blood stream. I explained to the patient situations that would increase the PSA, such as: a man's age,  BPH, infection, recent intercourse/ejaculation, prostate infarction, recent urethroscopic manipulation (Foley placement/cystoscopy) and prostate cancer.   - see prostate nodule  2. Prostate nodules  - explained the findings to the patient and recommended that he undergo a biopsy at this time  - Patient will be schedule for a TRUSPBx of prostate.  The procedure is explained and the risks involved, such as blood in urine, blood in stool, blood in semen, infection, urinary retention, and on rare occasions sepsis and death.  Patient understands the risks as explained to him and he wishes to proceed.  Patient is on ASA 81 mg daily and is advised to discontinue the medication ten days prior to the biopsy.  3. BPH with LUTS  - IPSS score is 2/0  - Continue conservative management, avoiding bladder irritants and timed voiding's    4. Erectile dysfunction  - SHIM score is 24  - Continue Viagra    Return for TRUSPBx of prostate.  These notes generated with voice recognition software. I apologize for typographical errors.  Zara Council, [facility] [address], [place], [place] [address] [phone]

## 2016-07-07 LAB — URINALYSIS, COMPLETE
Bilirubin, UA: NEGATIVE
GLUCOSE, UA: NEGATIVE
Ketones, UA: NEGATIVE
Leukocytes, UA: NEGATIVE
NITRITE UA: NEGATIVE
Protein, UA: NEGATIVE
RBC, UA: NEGATIVE
Specific Gravity, UA: 1.01 (ref 1.005–1.030)
Urobilinogen, Ur: 0.2 mg/dL (ref 0.2–1.0)
pH, UA: 6.5 (ref 5.0–7.5)

## 2016-07-07 LAB — PSA: PROSTATE SPECIFIC AG, SERUM: 6.8 ng/mL — AB (ref 0.0–4.0)

## 2016-07-07 LAB — MICROSCOPIC EXAMINATION
Bacteria, UA: NONE SEEN
EPITHELIAL CELLS (NON RENAL): NONE SEEN /HPF (ref 0–10)
WBC, UA: NONE SEEN /hpf (ref 0–?)

## 2016-07-14 ENCOUNTER — Encounter: Payer: Self-pay | Admitting: Urology

## 2016-07-27 ENCOUNTER — Telehealth: Payer: Self-pay | Admitting: Urology

## 2016-07-27 NOTE — Telephone Encounter (Signed)
Patient has called and stated that he does not want to have his biopsy done here and he is transferring his care to [facility]. He is going to come in and sign a release form to have his records sent there.  Sharyn Lull

## 2016-08-06 ENCOUNTER — Other Ambulatory Visit: Payer: Medicare PPO

## 2016-08-11 DIAGNOSIS — N401 Enlarged prostate with lower urinary tract symptoms: Secondary | ICD-10-CM | POA: Insufficient documentation

## 2016-08-11 DIAGNOSIS — N4231 Prostatic intraepithelial neoplasia: Secondary | ICD-10-CM | POA: Insufficient documentation

## 2016-08-11 DIAGNOSIS — N138 Other obstructive and reflux uropathy: Secondary | ICD-10-CM | POA: Insufficient documentation

## 2016-08-11 DIAGNOSIS — R339 Retention of urine, unspecified: Secondary | ICD-10-CM | POA: Insufficient documentation

## 2016-08-13 ENCOUNTER — Ambulatory Visit: Payer: Medicare PPO

## 2016-12-17 ENCOUNTER — Telehealth: Payer: Self-pay | Admitting: Family Medicine

## 2016-12-17 DIAGNOSIS — Z1211 Encounter for screening for malignant neoplasm of colon: Secondary | ICD-10-CM

## 2016-12-17 NOTE — Telephone Encounter (Signed)
Order printed and given to [name]. LMOVM notifying pt Cologuard has been ordered.

## 2016-12-17 NOTE — Telephone Encounter (Signed)
Pt is requesting to have a cologuard screening.  CB#[phone]/MW

## 2016-12-17 NOTE — Telephone Encounter (Signed)
OK, please print order and give to sarah, advise patient that Cologuard kit will arrive in mail in a few weeks.

## 2016-12-21 ENCOUNTER — Telehealth: Payer: Self-pay | Admitting: Family Medicine

## 2016-12-21 NOTE — Telephone Encounter (Signed)
Order for cologuard faxed to [facility] °

## 2017-01-02 LAB — COLOGUARD: COLOGUARD: POSITIVE

## 2017-01-04 ENCOUNTER — Encounter: Payer: Self-pay | Admitting: Family Medicine

## 2017-01-04 ENCOUNTER — Other Ambulatory Visit: Payer: Self-pay | Admitting: Family Medicine

## 2017-01-04 DIAGNOSIS — R195 Other fecal abnormalities: Secondary | ICD-10-CM | POA: Insufficient documentation

## 2017-01-05 ENCOUNTER — Telehealth: Payer: Self-pay | Admitting: Family Medicine

## 2017-01-05 NOTE — Telephone Encounter (Signed)
Cathy with [facility] called saying they have faxed his cologuard text yesterday and it was a positive test.  They want [place] to be on the look out for the results.  Her call back is [phone]  Thanks Con Memos

## 2017-01-11 ENCOUNTER — Telehealth: Payer: Self-pay

## 2017-01-11 NOTE — Telephone Encounter (Signed)
Patient called wanting to know the status of his GI referral. Has this been scheduled yet? Please advise. Contact info is correct. Thanks!

## 2017-01-12 NOTE — Telephone Encounter (Signed)
Pt advised referral sent to [facility].Their office will contact him.I also gave him their contact information

## 2017-02-16 ENCOUNTER — Ambulatory Visit (INDEPENDENT_AMBULATORY_CARE_PROVIDER_SITE_OTHER): Payer: Medicare PPO | Admitting: Gastroenterology

## 2017-02-16 ENCOUNTER — Other Ambulatory Visit: Payer: Self-pay

## 2017-02-16 ENCOUNTER — Encounter: Payer: Self-pay | Admitting: Gastroenterology

## 2017-02-16 VITALS — BP 118/79 | HR 66 | Temp 98.4°F | Ht 66.0 in | Wt 174.0 lb

## 2017-02-16 DIAGNOSIS — R195 Other fecal abnormalities: Secondary | ICD-10-CM | POA: Diagnosis not present

## 2017-02-16 NOTE — Progress Notes (Signed)
Jason Darby, MD [address]  [place]  [place],  [address]  Main: [phone]  Fax: [phone]    Gastroenterology Consultation  Referring Provider:     Birdie Sons, MD Primary Care Physician:  Birdie Sons, MD Primary Gastroenterologist:  Dr. Cephas Holmes Reason for Consultation:     Cologaurd test        HPI:   Jason Holmes is a 66 y.o. y/o male referred by Dr. Birdie Sons, MD  for consultation & management of Positive colo-guard test. Does not have a significant past medical history, otherwise very healthy gentleman. He denies any GI symptoms. He has been getting annual fecal occult blood testing to date and has been negative per his report. He never had a colonoscopy. He denies any family history of colon cancer or other GI malignancies. He has elevated PSA and underwent a prostate biopsy which showed atypical cells. He is closely monitored by his urologist. He chews tobacco 3 times daily. Did not have any gastrointestinal surgeries GI Procedures: None  Past Medical History:  Diagnosis Date  . Erectile dysfunction   . Hyperlipidemia   . Hypertension     Past Surgical History:  Procedure Laterality Date  . NASAL FRACTURE SURGERY  2005  . TONSILLECTOMY  1972    Prior to Admission medications   Medication Sig Start Date End Date Taking? Authorizing Provider  aspirin 81 MG tablet  02/26/09   [provider]  Aspirin-Calcium Carbonate 81-777 MG TABS  02/26/09   [provider]  DOCOSAHEXAENOIC ACID PO Take by mouth.    [provider]  Glucosamine HCl 1000 MG TABS Take by mouth.    [provider]  Glucosamine Sulfate (GNP GLUCOSAME MAXIMUM STRENGTH) 1000 MG TABS Take by mouth.    [provider]  hydrochlorothiazide (HYDRODIURIL) 25 MG tablet TAKE 1 TABLET EVERY DAY 06/10/16   Birdie Sons, MD  MULTIPLE VITAMIN PO Take by mouth daily.    [provider]  sildenafil (VIAGRA) 100  MG tablet Take 1 tablet (100 mg total) by mouth daily as needed for erectile dysfunction. 04/29/16   Birdie Sons, MD  simvastatin (ZOCOR) 20 MG tablet TAKE 1 TABLET AT BEDTIME (NEED MD APPOINTMENT) 06/10/16   Birdie Sons, MD    Family History  Problem Relation Age of Onset  . Heart attack Mother   . Heart attack Father   . Prostate cancer Neg Hx   . Kidney cancer Neg Hx      Social History  Substance Use Topics  . Smoking status: Former Research scientist (life sciences)  . Smokeless tobacco: Current User    Types: Chew  . Alcohol use 42.0 oz/week    70 Cans of beer per week    Allergies as of 02/16/2017 - Review Complete 07/06/2016  Allergen Reaction Noted  . Lovastatin  08/08/2015    Review of Systems:    All systems reviewed and negative except where noted in HPI.   Physical Exam:  BP 118/79   Pulse 66   Temp 98.4 F (36.9 C) (Oral)   Ht 5\' 6"  (1.676 m)   Wt 174 lb (78.9 kg)   BMI 28.08 kg/m  No LMP for male patient.  General:   Alert,  Well-developed, well-nourished, pleasant and cooperative in NAD Head:  Normocephalic and atraumatic. Eyes:  Sclera clear, no icterus.   Conjunctiva pink. Ears:  Normal auditory acuity. Nose:  No deformity, discharge, or lesions. Mouth:  No deformity or lesions,oropharynx pink & moist. Neck:  Supple; no masses or thyromegaly. Lungs:  Respirations even and unlabored.  Clear throughout to auscultation.   No wheezes, crackles, or rhonchi. No acute distress. Heart:  Regular rate and rhythm; no murmurs, clicks, rubs, or gallops. Abdomen:  Normal bowel sounds.  No bruits.  Soft, non-tender and non-distended without masses, hepatosplenomegaly or hernias noted.  No guarding or rebound tenderness.   Rectal: Nor performed Msk:  Symmetrical without gross deformities. Good, equal movement & strength bilaterally. Pulses:  Normal pulses noted. Extremities:  No clubbing or edema.  No cyanosis. Neurologic:  Alert and oriented x3;  grossly normal  neurologically. Skin:  Intact without significant lesions or rashes. No jaundice. Lymph Nodes:  No significant cervical adenopathy. Psych:  Alert and cooperative. Normal mood and affect.  Imaging Studies: N/A  Assessment and Plan:   [name] is a 66 y.o. y/o male with Hypertension, hyperlipidemia, obesity with positive colo-guard test here to discuss about colonoscopy. I recommend colonoscopy for the detection of polyps and tumors. I have discussed risks & benefits,  which include, but are not limited to, bleeding, infection, perforation,respiratory complication & drug reaction.  The patient agrees with this plan & written consent will be obtained.    Follow up as needed   Jason Darby, MD

## 2017-02-17 ENCOUNTER — Encounter: Payer: Self-pay | Admitting: *Deleted

## 2017-02-18 ENCOUNTER — Encounter: Payer: Self-pay | Admitting: Anesthesiology

## 2017-02-23 NOTE — Discharge Instructions (Signed)
General Anesthesia, Adult, Care After °These instructions provide you with information about caring for yourself after your procedure. Your health care provider may also give you more specific instructions. Your treatment has been planned according to current medical practices, but problems sometimes occur. Call your health care provider if you have any problems or questions after your procedure. °What can I expect after the procedure? °After the procedure, it is common to have: °· Vomiting. °· A sore throat. °· Mental slowness. ° °It is common to feel: °· Nauseous. °· Cold or shivery. °· Sleepy. °· Tired. °· Sore or achy, even in parts of your body where you did not have surgery. ° °Follow these instructions at home: °For at least 24 hours after the procedure: °· Do not: °? Participate in activities where you could fall or become injured. °? Drive. °? Use heavy machinery. °? Drink alcohol. °? Take sleeping pills or medicines that cause drowsiness. °? Make important decisions or sign legal documents. °? Take care of children on your own. °· Rest. °Eating and drinking °· If you vomit, drink water, juice, or soup when you can drink without vomiting. °· Drink enough fluid to keep your urine clear or pale yellow. °· Make sure you have little or no nausea before eating solid foods. °· Follow the diet recommended by your health care provider. °General instructions °· Have a responsible adult stay with you until you are awake and alert. °· Return to your normal activities as told by your health care provider. Ask your health care provider what activities are safe for you. °· Take over-the-counter and prescription medicines only as told by your health care provider. °· If you smoke, do not smoke without supervision. °· Keep all follow-up visits as told by your health care provider. This is important. °Contact a health care provider if: °· You continue to have nausea or vomiting at home, and medicines are not helpful. °· You  cannot drink fluids or start eating again. °· You cannot urinate after 8-12 hours. °· You develop a skin rash. °· You have fever. °· You have increasing redness at the site of your procedure. °Get help right away if: °· You have difficulty breathing. °· You have chest pain. °· You have unexpected bleeding. °· You feel that you are having a life-threatening or urgent problem. °This information is not intended to replace advice given to you by your health care provider. Make sure you discuss any questions you have with your health care provider. °Document Released: 08/17/2000 Document Revised: 10/14/2015 Document Reviewed: 04/25/2015 °Elsevier Interactive Patient Education © 2018 [employer] ° °

## 2017-02-24 ENCOUNTER — Ambulatory Visit: Payer: Medicare PPO | Admitting: Anesthesiology

## 2017-02-24 ENCOUNTER — Encounter
Admission: RE | Disposition: A | Discharge status: Home / Self Care | Payer: Self-pay | Source: Ambulatory Visit | Attending: Gastroenterology

## 2017-02-24 ENCOUNTER — Ambulatory Visit
Admission: RE | Admit: 2017-02-24 | Discharge: 2017-02-24 | Disposition: A | Discharge status: Home / Self Care | Payer: Medicare PPO | Source: Ambulatory Visit | Attending: Gastroenterology | Admitting: Gastroenterology

## 2017-02-24 DIAGNOSIS — Z1211 Encounter for screening for malignant neoplasm of colon: Secondary | ICD-10-CM | POA: Insufficient documentation

## 2017-02-24 DIAGNOSIS — K648 Other hemorrhoids: Secondary | ICD-10-CM | POA: Insufficient documentation

## 2017-02-24 DIAGNOSIS — I10 Essential (primary) hypertension: Secondary | ICD-10-CM | POA: Diagnosis not present

## 2017-02-24 DIAGNOSIS — R195 Other fecal abnormalities: Secondary | ICD-10-CM | POA: Diagnosis not present

## 2017-02-24 DIAGNOSIS — E785 Hyperlipidemia, unspecified: Secondary | ICD-10-CM | POA: Insufficient documentation

## 2017-02-24 DIAGNOSIS — K5730 Diverticulosis of large intestine without perforation or abscess without bleeding: Secondary | ICD-10-CM | POA: Diagnosis not present

## 2017-02-24 DIAGNOSIS — Z7982 Long term (current) use of aspirin: Secondary | ICD-10-CM | POA: Diagnosis not present

## 2017-02-24 DIAGNOSIS — Z87891 Personal history of nicotine dependence: Secondary | ICD-10-CM | POA: Diagnosis not present

## 2017-02-24 DIAGNOSIS — Z79899 Other long term (current) drug therapy: Secondary | ICD-10-CM | POA: Diagnosis not present

## 2017-02-24 HISTORY — DX: Sciatica, unspecified side: M54.30

## 2017-02-24 HISTORY — PX: COLONOSCOPY: SHX5424

## 2017-02-24 SURGERY — COLONOSCOPY
Anesthesia: General | Wound class: Clean Contaminated

## 2017-02-24 MED ORDER — LIDOCAINE HCL (CARDIAC) 20 MG/ML IV SOLN
INTRAVENOUS | Status: DC | PRN
  Administered 2017-02-24: 20 mg via INTRAVENOUS

## 2017-02-24 MED ORDER — PROPOFOL 10 MG/ML IV BOLUS
INTRAVENOUS | Status: DC | PRN
  Administered 2017-02-24 (×2): 50 mg via INTRAVENOUS
  Administered 2017-02-24: 100 mg via INTRAVENOUS
  Administered 2017-02-24: 30 mg via INTRAVENOUS
  Administered 2017-02-24: 50 mg via INTRAVENOUS
  Administered 2017-02-24: 20 mg via INTRAVENOUS
  Administered 2017-02-24 (×4): 50 mg via INTRAVENOUS

## 2017-02-24 MED ORDER — LACTATED RINGERS IV SOLN
INTRAVENOUS | Status: DC
  Administered 2017-02-24: 10:00:00 via INTRAVENOUS

## 2017-02-24 MED ORDER — GLYCOPYRROLATE 0.2 MG/ML IJ SOLN
INTRAMUSCULAR | Status: DC | PRN
  Administered 2017-02-24: 0.2 mg via INTRAVENOUS
  Administered 2017-02-24: 0.1 mg via INTRAVENOUS

## 2017-02-24 SURGICAL SUPPLY — 23 items

## 2017-02-24 NOTE — Anesthesia Preprocedure Evaluation (Signed)
Anesthesia Evaluation  Patient identified by MRN, date of birth, ID band Patient awake    Reviewed: Allergy & Precautions, H&P , NPO status , Patient's Chart, lab work & pertinent test results, reviewed documented beta blocker date and time   Airway Mallampati: II  TM Distance: >3 FB Neck ROM: full    Dental no notable dental hx.    Pulmonary former smoker,    Pulmonary exam normal breath sounds clear to auscultation       Cardiovascular Exercise Tolerance: Good hypertension,  Rhythm:regular Rate:Normal     Neuro/Psych negative neurological ROS  negative psych ROS   GI/Hepatic negative GI ROS, Neg liver ROS,   Endo/Other  negative endocrine ROS  Renal/GU negative Renal ROS  negative genitourinary   Musculoskeletal   Abdominal   Peds  Hematology  (+) anemia ,   Anesthesia Other Findings   Reproductive/Obstetrics negative OB ROS                             Anesthesia Physical Anesthesia Plan  ASA: II  Anesthesia Plan: General   Post-op Pain Management:    Induction:   PONV Risk Score and Plan:   Airway Management Planned:   Additional Equipment:   Intra-op Plan:   Post-operative Plan:   Informed Consent: I have reviewed the patients History and Physical, chart, labs and discussed the procedure including the risks, benefits and alternatives for the proposed anesthesia with the patient or authorized representative who has indicated his/her understanding and acceptance.   Dental Advisory Given  Plan Discussed with: CRNA  Anesthesia Plan Comments:         Anesthesia Quick Evaluation

## 2017-02-24 NOTE — Transfer of Care (Signed)
Immediate Anesthesia Transfer of Care Note  Patient: Jason Holmes  Procedure(s) Performed: COLONOSCOPY (N/A )  Patient Location: PACU  Anesthesia Type: General  Level of Consciousness: awake, alert  and patient cooperative  Airway and Oxygen Therapy: Patient Spontanous Breathing and Patient connected to supplemental oxygen  Post-op Assessment: Post-op Vital signs reviewed, Patient's Cardiovascular Status Stable, Respiratory Function Stable, Patent Airway and No signs of Nausea or vomiting  Post-op Vital Signs: Reviewed and stable  Complications: No apparent anesthesia complications

## 2017-02-24 NOTE — H&P (Signed)
Jason Darby, MD [address]  [place]  [place], [place] [address]  Main: [phone]  Fax: [phone] Pager: [phone]  Primary Care Physician:  Birdie Sons, MD Primary Gastroenterologist:  Dr. Cephas Holmes  Pre-Procedure History & Physical: HPI:  Jason Holmes is a 66 y.o. male is here for an colonoscopy.   Past Medical History:  Diagnosis Date  . Erectile dysfunction   . Hyperlipidemia   . Hypertension   . Sciatica    right    Past Surgical History:  Procedure Laterality Date  . NASAL FRACTURE SURGERY  2005  . TONSILLECTOMY  1972    Prior to Admission medications   Medication Sig Start Date End Date Taking? Authorizing Provider  aspirin 81 MG tablet  02/26/09  Yes [provider]  diphenhydrAMINE (BENADRYL) 25 MG tablet Take 25 mg by mouth at bedtime as needed.   Yes [provider]  Glucosamine Sulfate (GNP GLUCOSAME MAXIMUM STRENGTH) 1000 MG TABS Take by mouth.   Yes [provider]  hydrochlorothiazide (HYDRODIURIL) 25 MG tablet TAKE 1 TABLET EVERY DAY 06/10/16  Yes Birdie Sons, MD  MULTIPLE VITAMIN PO Take by mouth daily.   Yes [provider]  sildenafil (VIAGRA) 100 MG tablet Take 1 tablet (100 mg total) by mouth daily as needed for erectile dysfunction. 04/29/16  Yes Birdie Sons, MD  simvastatin (ZOCOR) 20 MG tablet TAKE 1 TABLET AT BEDTIME (NEED MD APPOINTMENT) 06/10/16  Yes Birdie Sons, MD    Allergies as of 02/16/2017 - Review Complete 02/16/2017  Allergen Reaction Noted  . Lovastatin  08/08/2015    Family History  Problem Relation Age of Onset  . Heart attack Mother   . Heart attack Father   . Prostate cancer Neg Hx   . Kidney cancer Neg Hx     Social History   Social History  . Marital status: Married    Spouse name: N/A  . Number of children: N/A  . Years of education: N/A   Occupational History  . Not on file.   Social History Main Topics  . Smoking status: Former  Smoker    Types: Cigarettes    Quit date: 01/30/2003  . Smokeless tobacco: Current User    Types: Chew  . Alcohol use 42.0 oz/week    70 Cans of beer per week  . Drug use: No  . Sexual activity: Not on file   Other Topics Concern  . Not on file   Social History Narrative  . No narrative on file    Review of Systems: See HPI, otherwise negative ROS  Physical Exam: BP (!) 142/89   Pulse 66   Temp (!) 97.3 F (36.3 C) (Temporal)   Resp 16   Ht 5\' 6"  (1.676 m)   Wt 167 lb (75.8 kg)   SpO2 100%   BMI 26.95 kg/m  General:   Alert,  pleasant and cooperative in NAD Head:  Normocephalic and atraumatic. Neck:  Supple; no masses or thyromegaly. Lungs:  Clear throughout to auscultation.    Heart:  Regular rate and rhythm. Abdomen:  Soft, nontender and nondistended. Normal bowel sounds, without guarding, and without rebound.   Neurologic:  Alert and  oriented x4;  grossly normal neurologically.  Impression/Plan: Jason Holmes is here for an colonoscopy to be performed for cologaurd positive  Risks, benefits, limitations, and alternatives regarding  colonoscopy have been reviewed with the patient.  Questions have been answered.  All  parties agreeable.   Sherri Sear, MD  02/24/2017, 11:13 AM

## 2017-02-24 NOTE — Anesthesia Procedure Notes (Signed)
Procedure Name: MAC Date/Time: 02/24/2017 12:19 PM Performed by: Janna Arch Pre-anesthesia Checklist: Patient identified, Emergency Drugs available, Suction available and Patient being monitored Patient Re-evaluated:Patient Re-evaluated prior to induction Oxygen Delivery Method: Nasal cannula

## 2017-02-24 NOTE — Op Note (Signed)
[facility] Gastroenterology Patient Name: Jason Holmes Procedure Date: 02/24/2017 11:12 AM MRN: [id number] Account #: [id number] Date of Birth: 1950/05/26 Admit Type: Outpatient Age: 66 Room: [facility] OR ROOM 01 Gender: Male Note Status: Finalized Procedure:            Colonoscopy Indications:          Positive Cologuard test Providers:            Lin Landsman MD, MD Referring MD:         Kirstie Peri. Caryn Section, MD (Referring MD) Medicines:            Monitored Anesthesia Care Complications:        No immediate complications. Estimated blood loss: None. Procedure:            Pre-Anesthesia Assessment:                       - Prior to the procedure, a History and Physical was                        performed, and patient medications and allergies were                        reviewed. The patient is competent. The risks and                        benefits of the procedure and the sedation options and                        risks were discussed with the patient. All questions                        were answered and informed consent was obtained.                        Patient identification and proposed procedure were                        verified by the physician, the nurse, the                        anesthesiologist, the anesthetist and the technician in                        the pre-procedure area in the procedure room. Mental                        Status Examination: alert and oriented. Airway                        Examination: normal oropharyngeal airway and neck                        mobility. Respiratory Examination: clear to                        auscultation. CV Examination: normal. Prophylactic                        Antibiotics: The patient does not require prophylactic  antibiotics. Prior Anticoagulants: The patient has                        taken no previous anticoagulant or antiplatelet agents.                        ASA  Grade Assessment: II - A patient with mild systemic                        disease. After reviewing the risks and benefits, the                        patient was deemed in satisfactory condition to undergo                        the procedure. The anesthesia plan was to use monitored                        anesthesia care (MAC). Immediately prior to                        administration of medications, the patient was                        re-assessed for adequacy to receive sedatives. The                        heart rate, respiratory rate, oxygen saturations, blood                        pressure, adequacy of pulmonary ventilation, and                        response to care were monitored throughout the                        procedure. The physical status of the patient was                        re-assessed after the procedure.                       After obtaining informed consent, the colonoscope was                        passed under direct vision. Throughout the procedure,                        the patient's blood pressure, pulse, and oxygen                        saturations were monitored continuously. The Olympus                        Colonoscope 190 [id number]) was introduced through the                        anus and advanced to the the cecum, identified by  appendiceal orifice and ileocecal valve. The                        colonoscopy was performed without difficulty. The                        patient tolerated the procedure well. The quality of                        the bowel preparation was evaluated using the BBPS                        [facility] Bowel Preparation Scale) with scores of: Right                        Colon = 3, Transverse Colon = 3 and Left Colon = 3                        (entire mucosa seen well with no residual staining,                        small fragments of stool or opaque liquid). The total                        BBPS  score equals 9. Findings:      The perianal and digital rectal examinations were normal. Pertinent       negatives include normal sphincter tone and no palpable rectal lesions.      Multiple small and large-mouthed diverticula were found in the sigmoid       colon.      Non-bleeding internal hemorrhoids were found during retroflexion. The       hemorrhoids were medium-sized. Impression:           - Diverticulosis in the sigmoid colon.                       - Non-bleeding internal hemorrhoids.                       - No specimens collected. Recommendation:       - Repeat colonoscopy in 10 years for surveillance. Procedure Code(s):    --- Professional ---                       [phone], Colonoscopy, flexible; diagnostic, including                        collection of specimen(s) by brushing or washing, when                        performed (separate procedure) Diagnosis Code(s):    --- Professional ---                       K64.8, Other hemorrhoids                       R19.5, Other fecal abnormalities                       K57.30, Diverticulosis of large intestine without  perforation or abscess without bleeding CPT copyright 2016 American Medical Association. All rights reserved. The codes documented in this report are preliminary and upon coder review may  be revised to meet current compliance requirements. Dr. Ulyess Mort Lin Landsman MD, MD 02/24/2017 12:53:47 PM This report has been signed electronically. Number of Addenda: 0 Note Initiated On: 02/24/2017 11:12 AM Scope Withdrawal Time: 0 hours 8 minutes 46 seconds  Total Procedure Duration: 0 hours 20 minutes 17 seconds       [facility]

## 2017-02-24 NOTE — Anesthesia Postprocedure Evaluation (Signed)
Anesthesia Post Note  Patient: Jason Holmes  Procedure(s) Performed: COLONOSCOPY (N/A )  Patient location during evaluation: PACU Anesthesia Type: General Level of consciousness: awake and alert Pain management: pain level controlled Vital Signs Assessment: post-procedure vital signs reviewed and stable Respiratory status: spontaneous breathing, nonlabored ventilation, respiratory function stable and patient connected to nasal cannula oxygen Cardiovascular status: blood pressure returned to baseline and stable Postop Assessment: no apparent nausea or vomiting Anesthetic complications: no    Alisa Graff

## 2017-02-25 ENCOUNTER — Encounter: Payer: Self-pay | Admitting: Gastroenterology

## 2017-03-29 ENCOUNTER — Other Ambulatory Visit: Payer: Self-pay | Admitting: Family Medicine

## 2017-03-29 NOTE — Telephone Encounter (Signed)
Left message to call back  

## 2017-03-29 NOTE — Telephone Encounter (Signed)
Pharmacy requesting refills. Thanks!  

## 2017-03-29 NOTE — Telephone Encounter (Signed)
Please advise patient have sent 90 day supply to mail-order, but he is due for follow up and CPE in December that needs to be scheduled.

## 2017-04-14 ENCOUNTER — Encounter: Payer: Self-pay | Admitting: Family Medicine

## 2017-04-14 ENCOUNTER — Ambulatory Visit (INDEPENDENT_AMBULATORY_CARE_PROVIDER_SITE_OTHER): Payer: Medicare PPO

## 2017-04-14 ENCOUNTER — Ambulatory Visit (INDEPENDENT_AMBULATORY_CARE_PROVIDER_SITE_OTHER): Payer: Medicare PPO | Admitting: Family Medicine

## 2017-04-14 VITALS — BP 134/78 | HR 80 | Temp 98.0°F | Ht 66.0 in | Wt 182.4 lb

## 2017-04-14 VITALS — BP 134/78 | HR 80 | Temp 98.0°F | Wt 182.4 lb

## 2017-04-14 DIAGNOSIS — N4231 Prostatic intraepithelial neoplasia: Secondary | ICD-10-CM | POA: Diagnosis not present

## 2017-04-14 DIAGNOSIS — Z0000 Encounter for general adult medical examination without abnormal findings: Secondary | ICD-10-CM

## 2017-04-14 DIAGNOSIS — G8929 Other chronic pain: Secondary | ICD-10-CM

## 2017-04-14 DIAGNOSIS — M25511 Pain in right shoulder: Secondary | ICD-10-CM

## 2017-04-14 DIAGNOSIS — E782 Mixed hyperlipidemia: Secondary | ICD-10-CM | POA: Diagnosis not present

## 2017-04-14 NOTE — Patient Instructions (Signed)
   Please go to the [place] lab draw center in [place] on the second floor of [facility] when you are fasting

## 2017-04-14 NOTE — Progress Notes (Signed)
Subjective:   Jason Holmes is a 66 y.o. male who presents for Medicare Annual/Subsequent preventive examination.  Review of Systems:  N/A  Cardiac Risk Factors include: advanced age (>50men, >61 women);dyslipidemia;male gender;hypertension;sedentary lifestyle;smoking/ tobacco exposure     Objective:    Vitals: BP 134/78 (BP Location: Left Arm)   Pulse 80   Temp 98 F (36.7 C) (Oral)   Ht 5\' 6"  (1.676 m)   Wt 182 lb 6.4 oz (82.7 kg)   BMI 29.44 kg/m   Body mass index is 29.44 kg/m.  Tobacco Social History   Tobacco Use  Smoking Status Former Smoker  . Types: Cigarettes  . Last attempt to quit: 01/30/2003  . Years since quitting: 14.2  Smokeless Tobacco Current User  . Types: Chew     Ready to quit: Not Answered Counseling given: Not Answered   Past Medical History:  Diagnosis Date  . Elevated PSA   . Erectile dysfunction   . Hyperlipidemia   . Hypertension   . Sciatica    right   Past Surgical History:  Procedure Laterality Date  . COLONOSCOPY N/A 02/24/2017   Procedure: COLONOSCOPY;  Surgeon: Lin Landsman, MD;  Location: [place] [facility];  Service: Endoscopy;  Laterality: N/A;  . NASAL FRACTURE SURGERY  2005  . PROSTATE BIOPSY    . TONSILLECTOMY  1972   Family History  Problem Relation Age of Onset  . Heart attack Mother   . Heart attack Father   . Prostate cancer Neg Hx   . Kidney cancer Neg Hx    Social History   Substance and Sexual Activity  Sexual Activity Not on file    Outpatient Encounter Medications as of 04/14/2017  Medication Sig  . aspirin 81 MG tablet Take 81 mg by mouth daily.   . diphenhydrAMINE (BENADRYL) 25 MG tablet Take 25 mg by mouth at bedtime as needed.  . Glucosamine Sulfate (GNP GLUCOSAME MAXIMUM STRENGTH) 1000 MG TABS Take by mouth daily.   . hydrochlorothiazide (HYDRODIURIL) 25 MG tablet TAKE 1 TABLET EVERY DAY  . MULTIPLE VITAMIN PO Take by mouth daily.  . sildenafil (VIAGRA) 100 MG tablet Take 1 tablet  (100 mg total) by mouth daily as needed for erectile dysfunction. (Patient taking differently: Take 50 mg by mouth daily as needed for erectile dysfunction. )  . simvastatin (ZOCOR) 20 MG tablet TAKE 1 TABLET AT BEDTIME (NEED MD APPOINTMENT)   No facility-administered encounter medications on file as of 04/14/2017.     Activities of Daily Living In your present state of health, do you have any difficulty performing the following activities: 04/14/2017 02/24/2017  Hearing? N N  Vision? N N  Difficulty concentrating or making decisions? N N  Walking or climbing stairs? N N  Dressing or bathing? N N  Doing errands, shopping? N -  Preparing Food and eating ? N -  Using the Toilet? N -  In the past six months, have you accidently leaked urine? N -  Do you have problems with loss of bowel control? N -  Managing your Medications? N -  Managing your Finances? N -  Housekeeping or managing your Housekeeping? N -  Some recent data might be hidden    Patient Care Team: Birdie Sons, MD as PCP - General (Family Medicine) Murrell Redden, MD as Consulting Physician (Urology) Dasher, Rayvon Char, MD as Consulting Physician (Dermatology) Lin Landsman, MD as Consulting Physician (Gastroenterology)   Assessment:     Exercise  Activities and Dietary recommendations Current Exercise Habits: Home exercise routine, Type of exercise: walking, Time (Minutes): 30, Frequency (Times/Week): 7, Weekly Exercise (Minutes/Week): 210, Intensity: Mild, Exercise limited by: None identified  Goals    . Reduce alcohol intake     Recommend cutting back on consumption of beer that's drank weekly. Pt states he will cut back by half (around 35 beers a week).      Fall Risk Fall Risk  04/14/2017 03/26/2016  Falls in the past year? No No   Depression Screen PHQ 2/9 Scores 04/14/2017 04/14/2017 03/26/2016  PHQ - 2 Score 0 0 0  PHQ- 9 Score 1 - -    Cognitive Function: Pt declined screening today.       6CIT Screen 03/26/2016  What Year? 0 points  What month? 0 points  What time? 0 points  Count back from 20 0 points  Months in reverse 0 points  Repeat phrase 2 points  Total Score 2    Immunization History  Administered Date(s) Administered  . Hepatitis A 06/10/2000, 12/07/2000  . Pneumococcal Conjugate-13 10/16/2014  . Pneumococcal Polysaccharide-23 03/27/2016  . Td 03/27/1997, 03/09/2008  . Tdap 03/09/2008  . Typhoid Inactivated 06/10/2000  . Zoster 03/16/2011   Screening Tests Health Maintenance  Topic Date Due  . INFLUENZA VACCINE  12/23/2016  . TETANUS/TDAP  03/26/2026 (Originally 03/09/2018)  . Fecal DNA (Cologuard)  01/03/2020  . Hepatitis C Screening  Completed  . PNA vac Low Risk Adult  Completed      Plan:  I have personally reviewed and addressed the Medicare Annual Wellness questionnaire and have noted the following in the patient's chart:  A. Medical and social history B. Use of alcohol, tobacco or illicit drugs  C. Current medications and supplements D. Functional ability and status E.  Nutritional status F.  Physical activity G. Advance directives H. List of other physicians I.  Hospitalizations, surgeries, and ER visits in previous 12 months J.  [place] such as hearing and vision if needed, cognitive and depression L. Referrals and appointments - none  In addition, I have reviewed and discussed with patient certain preventive protocols, quality metrics, and best practice recommendations. A written personalized care plan for preventive services as well as general preventive health recommendations were provided to patient.  See attached scanned questionnaire for additional information.   Signed,  Fabio Neighbors, LPN Nurse Health Advisor   MD Recommendations: Pt declined the flu shot today. Pt if planning to receive this next week at pharmacy.

## 2017-04-14 NOTE — Patient Instructions (Signed)
Jason Holmes , Thank you for taking time to come for your Medicare Wellness Visit. I appreciate your ongoing commitment to your health goals. Please review the following plan we discussed and let me know if I can assist you in the future.   Screening recommendations/referrals: Colonoscopy: Up to date Recommended yearly ophthalmology/optometry visit for glaucoma screening and checkup Recommended yearly dental visit for hygiene and checkup  Vaccinations: Influenza vaccine: declined Pneumococcal vaccine: completed Tdap vaccine: completed Shingles vaccine: completed  Advanced directives: Advance directive discussed with you today. I have provided a copy for you to complete at home and have notarized. Once this is complete please bring a copy in to our office so we can scan it into your chart.  Conditions/risks identified: Recommend cutting back on consumption of beer that's drank weekly. Pt states he will cut back by half (around 35 beers a week).  Next appointment: 2:00 PM today  Preventive Care 65 Years and Older, Male Preventive care refers to lifestyle choices and visits with your health care provider that can promote health and wellness. What does preventive care include?  A yearly physical exam. This is also called an annual well check.  Dental exams once or twice a year.  Routine eye exams. Ask your health care provider how often you should have your eyes checked.  Personal lifestyle choices, including:  Daily care of your teeth and gums.  Regular physical activity.  Eating a healthy diet.  Avoiding tobacco and drug use.  Limiting alcohol use.  Practicing safe sex.  Taking low doses of aspirin every day.  Taking vitamin and mineral supplements as recommended by your health care provider. What happens during an annual well check? The services and screenings done by your health care provider during your annual well check will depend on your age, overall health,  lifestyle risk factors, and family history of disease. Counseling  Your health care provider may ask you questions about your:  Alcohol use.  Tobacco use.  Drug use.  Emotional well-being.  Home and relationship well-being.  Sexual activity.  Eating habits.  History of falls.  Memory and ability to understand (cognition).  Work and work Statistician. Screening  You may have the following tests or measurements:  Height, weight, and BMI.  Blood pressure.  Lipid and cholesterol levels. These may be checked every 5 years, or more frequently if you are over 8 years old.  Skin check.  Lung cancer screening. You may have this screening every year starting at age 3 if you have a 30-pack-year history of smoking and currently smoke or have quit within the past 15 years.  Fecal occult blood test (FOBT) of the stool. You may have this test every year starting at age 74.  Flexible sigmoidoscopy or colonoscopy. You may have a sigmoidoscopy every 5 years or a colonoscopy every 10 years starting at age 66.  Prostate cancer screening. Recommendations will vary depending on your family history and other risks.  Hepatitis C blood test.  Hepatitis B blood test.  Sexually transmitted disease (STD) testing.  Diabetes screening. This is done by checking your blood sugar (glucose) after you have not eaten for a while (fasting). You may have this done every 1-3 years.  Abdominal aortic aneurysm (AAA) screening. You may need this if you are a current or former smoker.  Osteoporosis. You may be screened starting at age 13 if you are at high risk. Talk with your health care provider about your test results, treatment options, and  if necessary, the need for more tests. Vaccines  Your health care provider may recommend certain vaccines, such as:  Influenza vaccine. This is recommended every year.  Tetanus, diphtheria, and acellular pertussis (Tdap, Td) vaccine. You may need a Td booster  every 10 years.  Zoster vaccine. You may need this after age 68.  Pneumococcal 13-valent conjugate (PCV13) vaccine. One dose is recommended after age 71.  Pneumococcal polysaccharide (PPSV23) vaccine. One dose is recommended after age 26. Talk to your health care provider about which screenings and vaccines you need and how often you need them. This information is not intended to replace advice given to you by your health care provider. Make sure you discuss any questions you have with your health care provider. Document Released: 06/07/2015 Document Revised: 01/29/2016 Document Reviewed: 03/12/2015 Elsevier Interactive Patient Education  2017 [place] Prevention in the Home Falls can cause injuries. They can happen to people of all ages. There are many things you can do to make your home safe and to help prevent falls. What can I do on the outside of my home?  Regularly fix the edges of walkways and driveways and fix any cracks.  Remove anything that might make you trip as you walk through a door, such as a raised step or threshold.  Trim any bushes or trees on the path to your home.  Use bright outdoor lighting.  Clear any walking paths of anything that might make someone trip, such as rocks or tools.  Regularly check to see if handrails are loose or broken. Make sure that both sides of any steps have handrails.  Any raised decks and porches should have guardrails on the edges.  Have any leaves, snow, or ice cleared regularly.  Use sand or salt on walking paths during winter.  Clean up any spills in your garage right away. This includes oil or grease spills. What can I do in the bathroom?  Use night lights.  Install grab bars by the toilet and in the tub and shower. Do not use towel bars as grab bars.  Use non-skid mats or decals in the tub or shower.  If you need to sit down in the shower, use a plastic, non-slip stool.  Keep the floor dry. Clean up any  water that spills on the floor as soon as it happens.  Remove soap buildup in the tub or shower regularly.  Attach bath mats securely with double-sided non-slip rug tape.  Do not have throw rugs and other things on the floor that can make you trip. What can I do in the bedroom?  Use night lights.  Make sure that you have a light by your bed that is easy to reach.  Do not use any sheets or blankets that are too big for your bed. They should not hang down onto the floor.  Have a firm chair that has side arms. You can use this for support while you get dressed.  Do not have throw rugs and other things on the floor that can make you trip. What can I do in the kitchen?  Clean up any spills right away.  Avoid walking on wet floors.  Keep items that you use a lot in easy-to-reach places.  If you need to reach something above you, use a strong step stool that has a grab bar.  Keep electrical cords out of the way.  Do not use floor polish or wax that makes floors slippery. If you  must use wax, use non-skid floor wax.  Do not have throw rugs and other things on the floor that can make you trip. What can I do with my stairs?  Do not leave any items on the stairs.  Make sure that there are handrails on both sides of the stairs and use them. Fix handrails that are broken or loose. Make sure that handrails are as long as the stairways.  Check any carpeting to make sure that it is firmly attached to the stairs. Fix any carpet that is loose or worn.  Avoid having throw rugs at the top or bottom of the stairs. If you do have throw rugs, attach them to the floor with carpet tape.  Make sure that you have a light switch at the top of the stairs and the bottom of the stairs. If you do not have them, ask someone to add them for you. What else can I do to help prevent falls?  Wear shoes that:  Do not have high heels.  Have rubber bottoms.  Are comfortable and fit you well.  Are closed  at the toe. Do not wear sandals.  If you use a stepladder:  Make sure that it is fully opened. Do not climb a closed stepladder.  Make sure that both sides of the stepladder are locked into place.  Ask someone to hold it for you, if possible.  Clearly mark and make sure that you can see:  Any grab bars or handrails.  First and last steps.  Where the edge of each step is.  Use tools that help you move around (mobility aids) if they are needed. These include:  Canes.  Walkers.  Scooters.  Crutches.  Turn on the lights when you go into a dark area. Replace any light bulbs as soon as they burn out.  Set up your furniture so you have a clear path. Avoid moving your furniture around.  If any of your floors are uneven, fix them.  If there are any pets around you, be aware of where they are.  Review your medicines with your doctor. Some medicines can make you feel dizzy. This can increase your chance of falling. Ask your doctor what other things that you can do to help prevent falls. This information is not intended to replace advice given to you by your health care provider. Make sure you discuss any questions you have with your health care provider. Document Released: 03/07/2009 Document Revised: 10/17/2015 Document Reviewed: 06/15/2014 Elsevier Interactive Patient Education  2017 [employer].

## 2017-04-14 NOTE — Progress Notes (Signed)
Patient: Jason Holmes, Male    DOB: 1950/10/09, 67 y.o.   MRN: [id number] Visit Date: 04/14/2017  Today's Provider: Lelon Huh, MD   Chief Complaint  Patient presents with  . Annual Exam   Subjective:   Patient saw McKenzie for AVW at 1:00pm today.   Annual physical exam Jason Holmes is a 66 y.o. male who presents today for health maintenance and complete physical. He feels well. He reports exercising daily. He reports he is sleeping well.  --------------------------------------------------------------------   Hypertension, follow-up:  BP Readings from Last 3 Encounters:  04/14/17 134/78  04/14/17 134/78  02/24/17 (!) 143/89    He was last seen for hypertension 1 years ago.  BP at that visit was 120 /64. Management since that visit includes continue medication.He reports good compliance with treatment. He is not having side effects.  He is exercising. He is not adherent to low salt diet.   Outside blood pressures are being checked. He is experiencing none.  Patient denies chest pain, chest pressure/discomfort, claudication, dyspnea, exertional chest pressure/discomfort, fatigue, irregular heart beat, lower extremity edema, near-syncope, orthopnea, palpitations, paroxysmal nocturnal dyspnea, syncope and tachypnea.   Cardiovascular risk factors include advanced age (older than 67 for men, 35 for women), dyslipidemia, hypertension and male gender.  ------------------------------------------------------------------------  Lipid/Cholesterol, Follow-up:   Last seen for this 11 months ago.  Management since that visit includes; labs checked, no changes.  Last Lipid Panel:    Component Value Date/Time   CHOL 199 05/12/2016 0954   TRIG 43 05/12/2016 0954   HDL 99 05/12/2016 0954   CHOLHDL 2.0 05/12/2016 0954   LDLCALC 91 05/12/2016 0954    He reports good compliance with treatment. He is not having side effects.   Wt Readings from Last 3 Encounters:    04/14/17 182 lb 6.4 oz (82.7 kg)  04/14/17 182 lb 6.4 oz (82.7 kg)  02/24/17 167 lb (75.8 kg)    ------------------------------------------------------------------------  Elevated PSA Has been followed by Dr. Jacqlyn Larsen and had biopsy in May showing PIN, is now following PSAs.    Review of Systems  Constitutional: Negative.  Negative for chills, diaphoresis and fever.  HENT: Negative for congestion, ear discharge, ear pain, hearing loss, nosebleeds, sore throat and tinnitus.   Eyes: Negative for photophobia, pain, discharge and redness.  Respiratory: Negative.  Negative for cough, shortness of breath, wheezing and stridor.   Cardiovascular: Negative.  Negative for chest pain, palpitations and leg swelling.  Gastrointestinal: Negative for abdominal pain, blood in stool, constipation, diarrhea, nausea and vomiting.  Endocrine: Negative for polydipsia.  Genitourinary: Negative for dysuria, flank pain, frequency, hematuria and urgency.  Musculoskeletal: Negative for back pain, myalgias and neck pain.  Skin: Negative for rash.  Allergic/Immunologic: Negative for environmental allergies.  Neurological: Negative for dizziness, tremors, seizures, weakness and headaches.  Hematological: Does not bruise/bleed easily.  Psychiatric/Behavioral: Negative for hallucinations and suicidal ideas. The patient is not nervous/anxious.     Social History      He  reports that he quit smoking about 14 years ago. His smoking use included cigarettes. His smokeless tobacco use includes chew. He reports that he drinks about 42.0 oz of alcohol per week. He reports that he does not use drugs.       Social History   Socioeconomic History  . Marital status: Married    Spouse name: None  . Number of children: None  . Years of education: None  . Highest education level: None  Social Needs  . Financial resource strain: None  . Food insecurity - worry: None  . Food insecurity - inability: None  . Transportation  needs - medical: None  . Transportation needs - non-medical: None  Occupational History  . None  Tobacco Use  . Smoking status: Former Smoker    Types: Cigarettes    Last attempt to quit: 01/30/2003    Years since quitting: 14.2  . Smokeless tobacco: Current User    Types: Chew  Substance and Sexual Activity  . Alcohol use: Yes    Alcohol/week: 42.0 oz    Types: 70 Cans of beer per week  . Drug use: No  . Sexual activity: None  Other Topics Concern  . None  Social History Narrative  . None    Past Medical History:  Diagnosis Date  . Elevated PSA   . Erectile dysfunction   . Hyperlipidemia   . Hypertension   . Sciatica    right     Patient Active Problem List   Diagnosis Date Noted  . Positive colorectal cancer screening using Cologuard test 01/04/2017  . Abnormal prostate exam 08/11/2016  . Benign localized hyperplasia of prostate with urinary obstruction 08/11/2016  . Incomplete emptying of bladder 08/11/2016  . Elevated PSA 05/13/2016  . Neuralgia neuritis, sciatic nerve 08/08/2015  . Palpitations 08/08/2015  . Absolute anemia 02/26/2009  . Toxic effect of venom 02/07/2009  . Essential (primary) hypertension 07/20/2007  . Combined fat and carbohydrate induced hyperlipemia 05/25/2004  . ED (erectile dysfunction) of organic origin 05/26/2003    Past Surgical History:  Procedure Laterality Date  . COLONOSCOPY N/A 02/24/2017   Procedure: COLONOSCOPY;  Surgeon: Lin Landsman, MD;  Location: [facility];  Service: Endoscopy;  Laterality: N/A;  . NASAL FRACTURE SURGERY  2005  . PROSTATE BIOPSY    . [place] History        Family Status  Relation Name Status  . Mother  Deceased at age 33  . Father  Deceased at age 25  . Sister  Alive  . Sister  Alive  . Neg Hx  (Not Specified)        His family history includes Heart attack in his father and mother.     Allergies  Allergen Reactions  . Lovastatin      Current  Outpatient Medications:  .  aspirin 81 MG tablet, Take 81 mg by mouth daily. , Disp: , Rfl:  .  diphenhydrAMINE (BENADRYL) 25 MG tablet, Take 25 mg by mouth at bedtime as needed., Disp: , Rfl:  .  Glucosamine Sulfate (GNP GLUCOSAME MAXIMUM STRENGTH) 1000 MG TABS, Take by mouth daily. , Disp: , Rfl:  .  hydrochlorothiazide (HYDRODIURIL) 25 MG tablet, TAKE 1 TABLET EVERY DAY, Disp: 90 tablet, Rfl: 0 .  MULTIPLE VITAMIN PO, Take by mouth daily., Disp: , Rfl:  .  sildenafil (VIAGRA) 100 MG tablet, Take 1 tablet (100 mg total) by mouth daily as needed for erectile dysfunction. (Patient taking differently: Take 50 mg by mouth daily as needed for erectile dysfunction. ), Disp: 88 tablet, Rfl: 4 .  simvastatin (ZOCOR) 20 MG tablet, TAKE 1 TABLET AT BEDTIME (NEED MD APPOINTMENT), Disp: 90 tablet, Rfl: 0   Patient Care Team: Birdie Sons, MD as PCP - General (Family Medicine) Murrell Redden, MD as Consulting Physician (Urology) Dasher, Rayvon Char, MD as Consulting Physician (Dermatology) Lin Landsman, MD as Consulting Physician (Gastroenterology)  Objective:   Vitals: BP 134/78 (BP Location: Right Arm, Patient Position: Sitting, Cuff Size: Normal)   Pulse 80   Temp 98 F (36.7 C) (Oral)   Wt 182 lb 6.4 oz (82.7 kg)   BMI 29.44 kg/m     Physical Exam   General Appearance:    Alert, cooperative, no distress, appears stated age  Head:    Normocephalic, without obvious abnormality, atraumatic  Eyes:    PERRL, conjunctiva/corneas clear, EOM's intact, fundi    benign, both eyes       Ears:    Normal TM's and external ear canals, both ears  Nose:   Nares normal, septum midline, mucosa normal, no drainage   or sinus tenderness  Throat:   Lips, mucosa, and tongue normal; teeth and gums normal  Neck:   Supple, symmetrical, trachea midline, no adenopathy;       thyroid:  No enlargement/tenderness/nodules; no carotid   bruit or JVD  Back:     Symmetric, no curvature, ROM normal, no CVA  tenderness  Lungs:     Clear to auscultation bilaterally, respirations unlabored  Chest wall:    No tenderness or deformity  Heart:    Regular rate and rhythm, S1 and S2 normal, no murmur, rub   or gallop  Abdomen:     Soft, non-tender, bowel sounds active all four quadrants,    no masses, no organomegaly  Genitalia:    deferred  Rectal:    deferred  Extremities:   Extremities normal, atraumatic, no cyanosis or edema  Pulses:   2+ and symmetric all extremities  Skin:   Skin color, texture, turgor normal, no rashes or lesions  Lymph nodes:   Cervical, supraclavicular, and axillary nodes normal  Neurologic:   CNII-XII intact. Normal strength, sensation and reflexes      throughout    Depression Screen PHQ 2/9 Scores 04/14/2017 04/14/2017 03/26/2016  PHQ - 2 Score 0 0 0  PHQ- 9 Score 1 - -      Assessment & Plan:     Routine Health Maintenance and Physical Exam  Exercise Activities and Dietary recommendations Goals    . Reduce alcohol intake     Recommend cutting back on consumption of beer that's drank weekly. Pt states he will cut back by half (around 35 beers a week).       Immunization History  Administered Date(s) Administered  . Hepatitis A 06/10/2000, 12/07/2000  . Pneumococcal Conjugate-13 10/16/2014  . Pneumococcal Polysaccharide-23 03/27/2016  . Td 03/27/1997, 03/09/2008  . Tdap 03/09/2008  . Typhoid Inactivated 06/10/2000  . Zoster 03/16/2011    Health Maintenance  Topic Date Due  . INFLUENZA VACCINE  09/07/2017 (Originally 12/23/2016)  . TETANUS/TDAP  03/26/2026 (Originally 03/09/2018)  . Fecal DNA (Cologuard)  01/03/2020  . Hepatitis C Screening  Completed  . PNA vac Low Risk Adult  Completed     Discussed health benefits of physical activity, and encouraged him to engage in regular exercise appropriate for his age and condition.    --------------------------------------------------------------------  1. Annual physical exam  - Lipid panel -  COMPLETE METABOLIC PANEL WITH GFR  2. Chronic right shoulder pain Not currently interested in orthopedic evaluation as it not affecting ADLs.   3. Combined fat and carbohydrate induced hyperlipemia He is tolerating simvastatin well with no adverse effects.     Lelon Huh, MD  [facility]

## 2017-04-22 LAB — LIPID PANEL
Cholesterol: 221 mg/dL — ABNORMAL HIGH (ref <200)
HDL: 101 mg/dL (ref >40)
LDL CHOLESTEROL (CALC): 103 mg/dL — AB
NON-HDL CHOLESTEROL (CALC): 120 mg/dL (ref <130)
TRIGLYCERIDES: 80 mg/dL (ref <150)
Total CHOL/HDL Ratio: 2.2 (calc) (ref <5.0)

## 2017-04-22 LAB — COMPLETE METABOLIC PANEL WITH GFR
AG Ratio: 1.9 (calc) (ref 1.0–2.5)
ALBUMIN MSPROF: 4.8 g/dL (ref 3.6–5.1)
ALT: 23 U/L (ref 9–46)
AST: 29 U/L (ref 10–35)
Alkaline phosphatase (APISO): 59 U/L (ref 40–115)
BILIRUBIN TOTAL: 0.7 mg/dL (ref 0.2–1.2)
BUN: 10 mg/dL (ref 7–25)
CHLORIDE: 98 mmol/L (ref 98–110)
CO2: 28 mmol/L (ref 20–32)
CREATININE: 0.8 mg/dL (ref 0.70–1.25)
Calcium: 10.1 mg/dL (ref 8.6–10.3)
GFR, EST AFRICAN AMERICAN: 106 mL/min/{1.73_m2} (ref >=60)
GFR, Est Non African American: 92 mL/min/{1.73_m2} (ref >=60)
GLOBULIN: 2.5 g/dL (ref 1.9–3.7)
GLUCOSE: 93 mg/dL (ref 65–99)
Potassium: 4.1 mmol/L (ref 3.5–5.3)
SODIUM: 134 mmol/L — AB (ref 135–146)
TOTAL PROTEIN: 7.3 g/dL (ref 6.1–8.1)

## 2017-06-05 ENCOUNTER — Other Ambulatory Visit: Payer: Self-pay | Admitting: Family Medicine

## 2017-06-05 NOTE — Telephone Encounter (Signed)
Patient called stating that [employer] had faxed over refill requests for his Generic Viagra a mth ago.  However no request was found.    Patient needs this sent in by fax please for Generic Viagra 100 mg. #88 pills (the only way they ship this.  The fax # is [phone], the telephone # is [phone] and the Address to [facility], [address] [facility] [place], [place]  [id number]

## 2017-06-07 ENCOUNTER — Other Ambulatory Visit: Payer: Self-pay

## 2017-06-07 DIAGNOSIS — R69 Illness, unspecified: Secondary | ICD-10-CM | POA: Diagnosis not present

## 2017-06-07 DIAGNOSIS — R972 Elevated prostate specific antigen [PSA]: Secondary | ICD-10-CM | POA: Diagnosis not present

## 2017-06-07 MED ORDER — SILDENAFIL CITRATE 100 MG PO TABS: 100.0000 mg | ORAL_TABLET | Freq: Every day | ORAL | 4 refills | Status: DC | PRN

## 2017-06-07 MED ORDER — SIMVASTATIN 20 MG PO TABS: ORAL_TABLET | ORAL | 4 refills | Status: DC

## 2017-06-07 MED ORDER — HYDROCHLOROTHIAZIDE 25 MG PO TABS: 25.0000 mg | ORAL_TABLET | Freq: Every day | ORAL | 4 refills | Status: DC

## 2017-06-07 NOTE — Addendum Note (Signed)
Addended by: Lelon Huh E on: 06/07/2017 10:07 AM   Modules accepted: Orders

## 2017-06-07 NOTE — Telephone Encounter (Signed)
Patient's wife was notified.  

## 2017-06-07 NOTE — Telephone Encounter (Addendum)
We cannot send faxes to out of country pharmacies, but he can pick up prescription and send or fax it wherever he wants.

## 2017-06-14 DIAGNOSIS — H5702 Anisocoria: Secondary | ICD-10-CM | POA: Diagnosis not present

## 2017-06-14 DIAGNOSIS — H04123 Dry eye syndrome of bilateral lacrimal glands: Secondary | ICD-10-CM | POA: Diagnosis not present

## 2017-06-14 DIAGNOSIS — H524 Presbyopia: Secondary | ICD-10-CM | POA: Diagnosis not present

## 2017-06-14 DIAGNOSIS — H40003 Preglaucoma, unspecified, bilateral: Secondary | ICD-10-CM | POA: Diagnosis not present

## 2017-06-17 DIAGNOSIS — R339 Retention of urine, unspecified: Secondary | ICD-10-CM | POA: Diagnosis not present

## 2017-06-17 DIAGNOSIS — N138 Other obstructive and reflux uropathy: Secondary | ICD-10-CM | POA: Diagnosis not present

## 2017-06-17 DIAGNOSIS — R3989 Other symptoms and signs involving the genitourinary system: Secondary | ICD-10-CM | POA: Diagnosis not present

## 2017-06-17 DIAGNOSIS — N4231 Prostatic intraepithelial neoplasia: Secondary | ICD-10-CM | POA: Diagnosis not present

## 2017-06-17 DIAGNOSIS — N401 Enlarged prostate with lower urinary tract symptoms: Secondary | ICD-10-CM | POA: Diagnosis not present

## 2017-06-17 DIAGNOSIS — R972 Elevated prostate specific antigen [PSA]: Secondary | ICD-10-CM | POA: Diagnosis not present

## 2017-07-02 DIAGNOSIS — N402 Nodular prostate without lower urinary tract symptoms: Secondary | ICD-10-CM | POA: Diagnosis not present

## 2017-07-02 DIAGNOSIS — N4231 Prostatic intraepithelial neoplasia: Secondary | ICD-10-CM | POA: Diagnosis not present

## 2017-10-12 DIAGNOSIS — H40003 Preglaucoma, unspecified, bilateral: Secondary | ICD-10-CM | POA: Diagnosis not present

## 2017-11-16 DIAGNOSIS — L570 Actinic keratosis: Secondary | ICD-10-CM | POA: Diagnosis not present

## 2017-11-16 DIAGNOSIS — D2262 Melanocytic nevi of left upper limb, including shoulder: Secondary | ICD-10-CM | POA: Diagnosis not present

## 2017-11-16 DIAGNOSIS — Z85828 Personal history of other malignant neoplasm of skin: Secondary | ICD-10-CM | POA: Diagnosis not present

## 2017-11-16 DIAGNOSIS — X32XXXA Exposure to sunlight, initial encounter: Secondary | ICD-10-CM | POA: Diagnosis not present

## 2017-11-16 DIAGNOSIS — L718 Other rosacea: Secondary | ICD-10-CM | POA: Diagnosis not present

## 2017-11-16 DIAGNOSIS — D2261 Melanocytic nevi of right upper limb, including shoulder: Secondary | ICD-10-CM | POA: Diagnosis not present

## 2017-11-16 DIAGNOSIS — Z08 Encounter for follow-up examination after completed treatment for malignant neoplasm: Secondary | ICD-10-CM | POA: Diagnosis not present

## 2017-11-16 DIAGNOSIS — D225 Melanocytic nevi of trunk: Secondary | ICD-10-CM | POA: Diagnosis not present

## 2017-11-22 DIAGNOSIS — M25511 Pain in right shoulder: Secondary | ICD-10-CM | POA: Diagnosis not present

## 2017-12-07 DIAGNOSIS — L728 Other follicular cysts of the skin and subcutaneous tissue: Secondary | ICD-10-CM | POA: Diagnosis not present

## 2017-12-15 ENCOUNTER — Encounter: Payer: Self-pay | Admitting: Urology

## 2017-12-15 ENCOUNTER — Ambulatory Visit: Payer: Medicare HMO | Admitting: Urology

## 2017-12-15 VITALS — BP 143/88 | HR 77 | Ht 66.0 in | Wt 179.6 lb

## 2017-12-15 DIAGNOSIS — R972 Elevated prostate specific antigen [PSA]: Secondary | ICD-10-CM | POA: Diagnosis not present

## 2017-12-16 ENCOUNTER — Encounter: Payer: Self-pay | Admitting: Urology

## 2017-12-16 LAB — PSA: Prostate Specific Ag, Serum: 8.6 ng/mL — ABNORMAL HIGH (ref 0.0–4.0)

## 2017-12-16 NOTE — Progress Notes (Signed)
12/15/2017 7:32 AM   Jason Holmes [birth year] [id number]  Referring provider: Birdie Sons, MD [address] [place], [place] [address]  Chief Complaint  Patient presents with  . Establish Care   Urologic history: 1.  Elevated PSA -Prostate biopsy April 2018 for PSA of 6.8/right prostate nodule; prostate volume 44.5 cc; pathology HGPIN and focus ASAP -Prostate MRI 06/2017 consistent with BPH/no suspicious lesions  HPI: 67 year old male presents for follow-up of the above problem list. He was initially seen by our physician's assistant in February 2018 and a prostate biopsy was recommended for an elevated PSA.  He subsequently saw Dr. Jacqlyn Larsen at [facility] where his biopsy and most recent follow-up has been performed.  He presently has no complaints.  His last PSA in January 2019 was 8.2.  PMH: Past Medical History:  Diagnosis Date  . Elevated PSA   . Erectile dysfunction   . Hyperlipidemia   . Hypertension   . Sciatica    right    Surgical History: Past Surgical History:  Procedure Laterality Date  . COLONOSCOPY N/A 02/24/2017   Procedure: COLONOSCOPY;  Surgeon: Lin Landsman, MD;  Location: [facility];  Service: Endoscopy;  Laterality: N/A;  . NASAL FRACTURE SURGERY  2005  . PROSTATE BIOPSY    . TONSILLECTOMY  1972    Home Medications:  Allergies as of 12/15/2017      Reactions   Lovastatin       Medication List        Accurate as of 12/15/17 11:59 PM. Always use your most recent med list.          aspirin 81 MG tablet Take 81 mg by mouth daily.   diphenhydrAMINE 25 MG tablet Commonly known as:  BENADRYL Take 25 mg by mouth at bedtime as needed.   GNP GLUCOSAME MAXIMUM STRENGTH 1000 MG Tabs Generic drug:  Glucosamine Sulfate Take by mouth daily.   hydrochlorothiazide 25 MG tablet Commonly known as:  HYDRODIURIL Take 1 tablet (25 mg total) by mouth daily.   MULTIPLE VITAMIN PO Take by mouth daily.   sildenafil 100 MG  tablet Commonly known as:  VIAGRA Take 1 tablet (100 mg total) by mouth daily as needed for erectile dysfunction.   simvastatin 20 MG tablet Commonly known as:  ZOCOR TAKE 1 TABLET AT BEDTIME       Allergies:  Allergies  Allergen Reactions  . Lovastatin     Family History: Family History  Problem Relation Age of Onset  . Heart attack Mother   . Heart attack Father   . Prostate cancer Neg Hx   . Kidney cancer Neg Hx     Social History:  reports that he quit smoking about 14 years ago. His smoking use included cigarettes. His smokeless tobacco use includes chew. He reports that he drinks about 42.0 oz of alcohol per week. He reports that he does not use drugs.  ROS: UROLOGY Frequent Urination?: No Hard to postpone urination?: No Burning/pain with urination?: No Get up at night to urinate?: Yes Leakage of urine?: No Urine stream starts and stops?: No Trouble starting stream?: No Do you have to strain to urinate?: No Blood in urine?: No Urinary tract infection?: No Sexually transmitted disease?: No Injury to kidneys or bladder?: No Painful intercourse?: No Weak stream?: No Erection problems?: Yes Penile pain?: No  Gastrointestinal Nausea?: No Vomiting?: No Indigestion/heartburn?: No Diarrhea?: No Constipation?: No  Constitutional Fever: No Night sweats?: No Weight loss?: No Fatigue?:  No  Skin Skin rash/lesions?: No Itching?: No  Eyes Blurred vision?: No Double vision?: No  Ears/Nose/Throat Sore throat?: No Sinus problems?: No  Hematologic/Lymphatic Swollen glands?: No Easy bruising?: Yes  Cardiovascular Leg swelling?: No Chest pain?: No  Respiratory Cough?: No Shortness of breath?: No  Endocrine Excessive thirst?: No  Musculoskeletal Back pain?: No Joint pain?: Yes  Neurological Headaches?: No Dizziness?: No  Psychologic Depression?: No Anxiety?: No  Physical Exam: BP (!) 143/88 (BP Location: Left Arm, Patient Position:  Sitting, Cuff Size: Large)   Pulse 77   Ht 5\' 6"  (1.676 m)   Wt 179 lb 9.6 oz (81.5 kg)   BMI 28.99 kg/m   Constitutional:  Alert and oriented, No acute distress. HEENT: [place] AT, moist mucus membranes.  Trachea midline, no masses. Cardiovascular: No clubbing, cyanosis, or edema. Respiratory: Normal respiratory effort, no increased work of breathing. GI: Abdomen is soft, nontender, nondistended, no abdominal masses GU: No CVA tenderness.  Prostate 40 g with slight asymmetry L>R.  No nodules appreciated on today's exam. Lymph: No cervical or inguinal lymphadenopathy. Skin: No rashes, bruises or suspicious lesions. Neurologic: Grossly intact, no focal deficits, moving all 4 extremities. Psychiatric: Normal mood and affect.    Assessment & Plan:   67 year old male with an elevated PSA and previous biopsy showing atypia.  Prostate MRI showed no lesions suspicious for high-grade prostate cancer.  Options were discussed of continued surveillance versus repeat standard 12 core biopsy.  He desires to continue surveillance and would elect a repeat MRI prior to rebiopsy.  PSA was drawn today and if stable he will follow-up in 6 months.  Return in about 6 months (around 06/17/2018) for Recheck, PSA.   [name], [place] [address] [place], [place] [address] [phone]

## 2017-12-22 DIAGNOSIS — M25511 Pain in right shoulder: Secondary | ICD-10-CM | POA: Diagnosis not present

## 2017-12-28 DIAGNOSIS — R69 Illness, unspecified: Secondary | ICD-10-CM | POA: Diagnosis not present

## 2018-01-06 ENCOUNTER — Other Ambulatory Visit: Payer: Self-pay

## 2018-03-16 DIAGNOSIS — R69 Illness, unspecified: Secondary | ICD-10-CM | POA: Diagnosis not present

## 2018-03-23 DIAGNOSIS — M25511 Pain in right shoulder: Secondary | ICD-10-CM | POA: Diagnosis not present

## 2018-03-30 DIAGNOSIS — M25511 Pain in right shoulder: Secondary | ICD-10-CM | POA: Diagnosis not present

## 2018-04-04 DIAGNOSIS — M25511 Pain in right shoulder: Secondary | ICD-10-CM | POA: Diagnosis not present

## 2018-04-14 DIAGNOSIS — M7541 Impingement syndrome of right shoulder: Secondary | ICD-10-CM | POA: Diagnosis not present

## 2018-04-14 DIAGNOSIS — M19011 Primary osteoarthritis, right shoulder: Secondary | ICD-10-CM | POA: Diagnosis not present

## 2018-04-14 DIAGNOSIS — S43431A Superior glenoid labrum lesion of right shoulder, initial encounter: Secondary | ICD-10-CM | POA: Diagnosis not present

## 2018-04-14 DIAGNOSIS — M7551 Bursitis of right shoulder: Secondary | ICD-10-CM | POA: Diagnosis not present

## 2018-04-14 DIAGNOSIS — M24111 Other articular cartilage disorders, right shoulder: Secondary | ICD-10-CM | POA: Diagnosis not present

## 2018-04-14 DIAGNOSIS — M7521 Bicipital tendinitis, right shoulder: Secondary | ICD-10-CM | POA: Diagnosis not present

## 2018-04-14 DIAGNOSIS — Y999 Unspecified external cause status: Secondary | ICD-10-CM | POA: Diagnosis not present

## 2018-04-14 DIAGNOSIS — G8918 Other acute postprocedural pain: Secondary | ICD-10-CM | POA: Diagnosis not present

## 2018-04-15 ENCOUNTER — Encounter: Payer: Self-pay | Admitting: Family Medicine

## 2018-04-15 ENCOUNTER — Ambulatory Visit: Payer: Self-pay

## 2018-04-19 ENCOUNTER — Telehealth: Payer: Self-pay

## 2018-04-19 NOTE — Telephone Encounter (Signed)
Called pt to schedule his AWV for this year and pt stated he wanted to skip this years wellness. FYI! -MM

## 2018-04-25 DIAGNOSIS — M19011 Primary osteoarthritis, right shoulder: Secondary | ICD-10-CM | POA: Diagnosis not present

## 2018-04-28 DIAGNOSIS — M6281 Muscle weakness (generalized): Secondary | ICD-10-CM | POA: Diagnosis not present

## 2018-04-28 DIAGNOSIS — M25511 Pain in right shoulder: Secondary | ICD-10-CM | POA: Diagnosis not present

## 2018-04-28 DIAGNOSIS — M25611 Stiffness of right shoulder, not elsewhere classified: Secondary | ICD-10-CM | POA: Diagnosis not present

## 2018-05-02 DIAGNOSIS — M25511 Pain in right shoulder: Secondary | ICD-10-CM | POA: Diagnosis not present

## 2018-05-02 DIAGNOSIS — M6281 Muscle weakness (generalized): Secondary | ICD-10-CM | POA: Diagnosis not present

## 2018-05-02 DIAGNOSIS — M25611 Stiffness of right shoulder, not elsewhere classified: Secondary | ICD-10-CM | POA: Diagnosis not present

## 2018-05-12 DIAGNOSIS — M25511 Pain in right shoulder: Secondary | ICD-10-CM | POA: Diagnosis not present

## 2018-05-12 DIAGNOSIS — M25611 Stiffness of right shoulder, not elsewhere classified: Secondary | ICD-10-CM | POA: Diagnosis not present

## 2018-05-12 DIAGNOSIS — M6281 Muscle weakness (generalized): Secondary | ICD-10-CM | POA: Diagnosis not present

## 2018-05-19 DIAGNOSIS — M6281 Muscle weakness (generalized): Secondary | ICD-10-CM | POA: Diagnosis not present

## 2018-05-19 DIAGNOSIS — M25611 Stiffness of right shoulder, not elsewhere classified: Secondary | ICD-10-CM | POA: Diagnosis not present

## 2018-05-19 DIAGNOSIS — M25511 Pain in right shoulder: Secondary | ICD-10-CM | POA: Diagnosis not present

## 2018-05-23 DIAGNOSIS — M25511 Pain in right shoulder: Secondary | ICD-10-CM | POA: Diagnosis not present

## 2018-05-24 ENCOUNTER — Other Ambulatory Visit: Payer: Self-pay | Admitting: Family Medicine

## 2018-05-26 DIAGNOSIS — M6281 Muscle weakness (generalized): Secondary | ICD-10-CM | POA: Diagnosis not present

## 2018-05-26 DIAGNOSIS — M25611 Stiffness of right shoulder, not elsewhere classified: Secondary | ICD-10-CM | POA: Diagnosis not present

## 2018-05-26 DIAGNOSIS — M25511 Pain in right shoulder: Secondary | ICD-10-CM | POA: Diagnosis not present

## 2018-06-09 DIAGNOSIS — M25511 Pain in right shoulder: Secondary | ICD-10-CM | POA: Diagnosis not present

## 2018-06-09 DIAGNOSIS — M6281 Muscle weakness (generalized): Secondary | ICD-10-CM | POA: Diagnosis not present

## 2018-06-09 DIAGNOSIS — M25611 Stiffness of right shoulder, not elsewhere classified: Secondary | ICD-10-CM | POA: Diagnosis not present

## 2018-06-23 DIAGNOSIS — M6281 Muscle weakness (generalized): Secondary | ICD-10-CM | POA: Diagnosis not present

## 2018-06-23 DIAGNOSIS — M25611 Stiffness of right shoulder, not elsewhere classified: Secondary | ICD-10-CM | POA: Diagnosis not present

## 2018-06-23 DIAGNOSIS — M25511 Pain in right shoulder: Secondary | ICD-10-CM | POA: Diagnosis not present

## 2018-06-28 DIAGNOSIS — R69 Illness, unspecified: Secondary | ICD-10-CM | POA: Diagnosis not present

## 2018-07-04 ENCOUNTER — Other Ambulatory Visit: Payer: Medicare HMO

## 2018-07-08 ENCOUNTER — Ambulatory Visit: Payer: Medicare HMO | Admitting: Urology

## 2018-07-13 ENCOUNTER — Other Ambulatory Visit: Payer: Medicare HMO

## 2018-07-13 ENCOUNTER — Other Ambulatory Visit: Payer: Self-pay

## 2018-07-13 DIAGNOSIS — L218 Other seborrheic dermatitis: Secondary | ICD-10-CM | POA: Diagnosis not present

## 2018-07-13 DIAGNOSIS — L820 Inflamed seborrheic keratosis: Secondary | ICD-10-CM | POA: Diagnosis not present

## 2018-07-13 DIAGNOSIS — Z85828 Personal history of other malignant neoplasm of skin: Secondary | ICD-10-CM | POA: Diagnosis not present

## 2018-07-13 DIAGNOSIS — R972 Elevated prostate specific antigen [PSA]: Secondary | ICD-10-CM

## 2018-07-13 DIAGNOSIS — Z872 Personal history of diseases of the skin and subcutaneous tissue: Secondary | ICD-10-CM | POA: Diagnosis not present

## 2018-07-13 DIAGNOSIS — L718 Other rosacea: Secondary | ICD-10-CM | POA: Diagnosis not present

## 2018-07-13 DIAGNOSIS — Z08 Encounter for follow-up examination after completed treatment for malignant neoplasm: Secondary | ICD-10-CM | POA: Diagnosis not present

## 2018-07-13 DIAGNOSIS — Z09 Encounter for follow-up examination after completed treatment for conditions other than malignant neoplasm: Secondary | ICD-10-CM | POA: Diagnosis not present

## 2018-07-13 DIAGNOSIS — M25511 Pain in right shoulder: Secondary | ICD-10-CM | POA: Diagnosis not present

## 2018-07-13 DIAGNOSIS — L538 Other specified erythematous conditions: Secondary | ICD-10-CM | POA: Diagnosis not present

## 2018-07-13 DIAGNOSIS — X32XXXA Exposure to sunlight, initial encounter: Secondary | ICD-10-CM | POA: Diagnosis not present

## 2018-07-13 DIAGNOSIS — L570 Actinic keratosis: Secondary | ICD-10-CM | POA: Diagnosis not present

## 2018-07-14 LAB — PSA: PROSTATE SPECIFIC AG, SERUM: 8.5 ng/mL — AB (ref 0.0–4.0)

## 2018-07-15 ENCOUNTER — Encounter: Payer: Self-pay | Admitting: Urology

## 2018-07-15 ENCOUNTER — Ambulatory Visit: Payer: Medicare HMO | Admitting: Urology

## 2018-07-15 VITALS — BP 155/74 | HR 76

## 2018-07-15 DIAGNOSIS — N4232 Atypical small acinar proliferation of prostate: Secondary | ICD-10-CM | POA: Diagnosis not present

## 2018-07-15 DIAGNOSIS — N4231 Prostatic intraepithelial neoplasia: Secondary | ICD-10-CM

## 2018-07-15 DIAGNOSIS — R972 Elevated prostate specific antigen [PSA]: Secondary | ICD-10-CM

## 2018-07-15 NOTE — Patient Instructions (Signed)

## 2018-07-15 NOTE — Progress Notes (Signed)
07/15/2018 1:33 PM   Jason Holmes 1950/09/19 [id number]  Referring provider: Birdie Sons, MD [address] [place], [place] [address]  Chief Complaint  Patient presents with  . Elevated PSA   Urologic history: 1.  Elevated PSA -Prostate biopsy April 2018 for PSA of 6.8/right prostate nodule; prostate volume 44.5 cc; pathology HGPIN and focus ASAP -Prostate MRI 06/2017 consistent with BPH/no suspicious lesions -PSA bump July 2019 73.22  HPI: 68 year old male presents for semiannual follow-up of the above problem list.  He has no bothersome lower urinary tract symptoms.  Denies dysuria, gross hematuria or flank/abdominal/pelvic/scrotal pain.  Repeat PSA 07/13/2018 stable at 8.5.    PMH: Past Medical History:  Diagnosis Date  . Elevated PSA   . Erectile dysfunction   . Hyperlipidemia   . Hypertension   . Sciatica    right    Surgical History: Past Surgical History:  Procedure Laterality Date  . COLONOSCOPY N/A 02/24/2017   Procedure: COLONOSCOPY;  Surgeon: Lin Landsman, MD;  Location: [facility];  Service: Endoscopy;  Laterality: N/A;  . NASAL FRACTURE SURGERY  2005  . PROSTATE BIOPSY    . TONSILLECTOMY  1972    Home Medications:  Allergies as of 07/15/2018      Reactions   Lovastatin       Medication List       Accurate as of July 15, 2018  1:33 PM. Always use your most recent med list.        aspirin 81 MG tablet Take 81 mg by mouth daily.   diphenhydrAMINE 25 MG tablet Commonly known as:  BENADRYL Take 25 mg by mouth at bedtime as needed.   GNP GLUCOSAME MAXIMUM STRENGTH 1000 MG Tabs Generic drug:  Glucosamine Sulfate Take by mouth daily.   hydrochlorothiazide 25 MG tablet Commonly known as:  HYDRODIURIL TAKE 1 TABLET DAILY   MULTIPLE VITAMIN PO Take by mouth daily.   sildenafil 100 MG tablet Commonly known as:  VIAGRA Take 1 tablet (100 mg total) by mouth daily as needed for erectile dysfunction.     simvastatin 20 MG tablet Commonly known as:  ZOCOR TAKE 1 TABLET AT BEDTIME       Allergies:  Allergies  Allergen Reactions  . Lovastatin     Family History: Family History  Problem Relation Age of Onset  . Heart attack Mother   . Heart attack Father   . Prostate cancer Neg Hx   . Kidney cancer Neg Hx     Social History:  reports that he quit smoking about 15 years ago. His smoking use included cigarettes. His smokeless tobacco use includes chew. He reports current alcohol use of about 70.0 standard drinks of alcohol per week. He reports that he does not use drugs.  ROS: UROLOGY Frequent Urination?: No Hard to postpone urination?: No Burning/pain with urination?: No Get up at night to urinate?: Yes Leakage of urine?: No Urine stream starts and stops?: No Trouble starting stream?: No Do you have to strain to urinate?: No Blood in urine?: No Urinary tract infection?: No Sexually transmitted disease?: No Injury to kidneys or bladder?: No Painful intercourse?: No Weak stream?: No Erection problems?: No Penile pain?: No  Gastrointestinal Nausea?: No Vomiting?: No Indigestion/heartburn?: No Diarrhea?: No Constipation?: No  Constitutional Fever: No Night sweats?: No Weight loss?: No Fatigue?: No  Skin Skin rash/lesions?: No Itching?: No  Eyes Blurred vision?: No Double vision?: No  Ears/Nose/Throat Sore throat?: No Sinus problems?: No  Hematologic/Lymphatic Swollen glands?: No Easy bruising?: No  Cardiovascular Leg swelling?: No Chest pain?: No  Respiratory Cough?: No Shortness of breath?: No  Endocrine Excessive thirst?: No  Musculoskeletal Back pain?: No Joint pain?: No  Neurological Headaches?: No Dizziness?: No  Psychologic Depression?: No Anxiety?: No  Physical Exam: BP (!) 155/74 (BP Location: Left Arm, Patient Position: Sitting, Cuff Size: Normal)   Pulse 76   Constitutional:  Alert and oriented, No acute  distress. HEENT: [place] AT, moist mucus membranes.  Trachea midline, no masses. Cardiovascular: No clubbing, cyanosis, or edema. Respiratory: Normal respiratory effort, no increased work of breathing.  GU: Prostate 40 g, smooth without nodules or induration.  Slight asymmetry  L >R. Skin: No rashes, bruises or suspicious lesions. Neurologic: Grossly intact, no focal deficits, moving all 4 extremities. Psychiatric: Normal mood and affect.   Assessment & Plan:   68 year old male with an elevated PSA and focus ASAP.  PSA stable at 8.5 and benign DRE.  He desires to continue surveillance.  We will have him follow-up in 6 months for a PSA in 1 year for PSA/DRE.  [name], [facility] [address] [place], [place] [address] [phone]

## 2018-08-22 ENCOUNTER — Other Ambulatory Visit: Payer: Self-pay | Admitting: Family Medicine

## 2018-11-15 ENCOUNTER — Other Ambulatory Visit: Payer: Self-pay | Admitting: Family Medicine

## 2019-01-05 DIAGNOSIS — R69 Illness, unspecified: Secondary | ICD-10-CM | POA: Diagnosis not present

## 2019-01-07 ENCOUNTER — Other Ambulatory Visit: Payer: Self-pay | Admitting: Family Medicine

## 2019-01-13 ENCOUNTER — Other Ambulatory Visit: Payer: Medicare HMO

## 2019-01-13 ENCOUNTER — Other Ambulatory Visit: Payer: Self-pay

## 2019-01-13 DIAGNOSIS — R972 Elevated prostate specific antigen [PSA]: Secondary | ICD-10-CM

## 2019-01-14 LAB — PSA: Prostate Specific Ag, Serum: 9.7 ng/mL — ABNORMAL HIGH (ref 0.0–4.0)

## 2019-01-15 ENCOUNTER — Encounter: Payer: Self-pay | Admitting: Urology

## 2019-01-17 NOTE — Telephone Encounter (Signed)
Is it ok for him to wait until February and do another PSA? That seems to be what is  leaning towards. See the message in his chart to you.  Thanks, Sharyn Lull

## 2019-01-25 DIAGNOSIS — L538 Other specified erythematous conditions: Secondary | ICD-10-CM | POA: Diagnosis not present

## 2019-01-25 DIAGNOSIS — L720 Epidermal cyst: Secondary | ICD-10-CM | POA: Diagnosis not present

## 2019-01-25 DIAGNOSIS — L570 Actinic keratosis: Secondary | ICD-10-CM | POA: Diagnosis not present

## 2019-01-25 DIAGNOSIS — X32XXXA Exposure to sunlight, initial encounter: Secondary | ICD-10-CM | POA: Diagnosis not present

## 2019-01-25 DIAGNOSIS — R208 Other disturbances of skin sensation: Secondary | ICD-10-CM | POA: Diagnosis not present

## 2019-01-25 DIAGNOSIS — L728 Other follicular cysts of the skin and subcutaneous tissue: Secondary | ICD-10-CM | POA: Diagnosis not present

## 2019-02-10 ENCOUNTER — Other Ambulatory Visit: Payer: Self-pay | Admitting: Family Medicine

## 2019-02-23 DIAGNOSIS — R69 Illness, unspecified: Secondary | ICD-10-CM | POA: Diagnosis not present

## 2019-02-24 ENCOUNTER — Telehealth: Payer: Self-pay | Admitting: Family Medicine

## 2019-02-24 NOTE — Telephone Encounter (Signed)
Pt called to report that he had a flu vaccine yesterday at [facility].  Con Memos

## 2019-02-24 NOTE — Telephone Encounter (Signed)
Noted. Will await fax from pharmacy to document.

## 2019-03-07 ENCOUNTER — Other Ambulatory Visit: Payer: Self-pay | Admitting: Family Medicine

## 2019-03-07 DIAGNOSIS — H524 Presbyopia: Secondary | ICD-10-CM | POA: Diagnosis not present

## 2019-03-07 DIAGNOSIS — H2513 Age-related nuclear cataract, bilateral: Secondary | ICD-10-CM | POA: Diagnosis not present

## 2019-03-07 DIAGNOSIS — H40013 Open angle with borderline findings, low risk, bilateral: Secondary | ICD-10-CM | POA: Diagnosis not present

## 2019-03-07 DIAGNOSIS — H04123 Dry eye syndrome of bilateral lacrimal glands: Secondary | ICD-10-CM | POA: Diagnosis not present

## 2019-03-08 NOTE — Telephone Encounter (Signed)
Several from yesterday

## 2019-04-04 DIAGNOSIS — Z0100 Encounter for examination of eyes and vision without abnormal findings: Secondary | ICD-10-CM | POA: Diagnosis not present

## 2019-04-06 ENCOUNTER — Other Ambulatory Visit: Payer: Self-pay | Admitting: Family Medicine

## 2019-05-02 ENCOUNTER — Encounter: Payer: Self-pay | Admitting: Family Medicine

## 2019-05-02 ENCOUNTER — Ambulatory Visit (INDEPENDENT_AMBULATORY_CARE_PROVIDER_SITE_OTHER): Payer: Medicare HMO | Admitting: Family Medicine

## 2019-05-02 ENCOUNTER — Other Ambulatory Visit: Payer: Self-pay

## 2019-05-02 VITALS — BP 128/74 | HR 81 | Temp 97.7°F | Resp 16 | Ht 66.0 in | Wt 187.0 lb

## 2019-05-02 DIAGNOSIS — F1010 Alcohol abuse, uncomplicated: Secondary | ICD-10-CM

## 2019-05-02 DIAGNOSIS — Z0000 Encounter for general adult medical examination without abnormal findings: Secondary | ICD-10-CM

## 2019-05-02 DIAGNOSIS — E782 Mixed hyperlipidemia: Secondary | ICD-10-CM | POA: Diagnosis not present

## 2019-05-02 DIAGNOSIS — R69 Illness, unspecified: Secondary | ICD-10-CM | POA: Diagnosis not present

## 2019-05-02 DIAGNOSIS — I10 Essential (primary) hypertension: Secondary | ICD-10-CM

## 2019-05-02 MED ORDER — HYDROCHLOROTHIAZIDE 25 MG PO TABS: 25.0000 mg | ORAL_TABLET | Freq: Every day | ORAL | 3 refills | Status: DC

## 2019-05-02 MED ORDER — SIMVASTATIN 20 MG PO TABS: 20.0000 mg | ORAL_TABLET | Freq: Every day | ORAL | 3 refills | Status: DC

## 2019-05-02 NOTE — Progress Notes (Signed)
Patient: Jason Holmes, Male    DOB: 1950-09-02, 68 y.o.   MRN: [id number] Visit Date: 05/02/2019  Today's Provider: Lelon Huh, MD   Chief Complaint  Patient presents with  . Medicare Wellness   Subjective:     Annual wellness visit Jason Holmes is a 68 y.o. male. He feels well. He reports exercising occasionally. He reports he is sleeping fairly well.  -----------------------------------------------------------     Social History   Socioeconomic History  . Marital status: Married    Spouse name: Not on file  . Number of children: Not on file  . Years of education: Not on file  . Highest education level: Not on file  Occupational History  . Not on file  Social Needs  . Financial resource strain: Not on file  . Food insecurity    Worry: Not on file    Inability: Not on file  . Transportation needs    Medical: Not on file    Non-medical: Not on file  Tobacco Use  . Smoking status: Former Smoker    Types: Cigarettes    Quit date: 01/30/2003    Years since quitting: 16.2  . Smokeless tobacco: Current User    Types: Chew  Substance and Sexual Activity  . Alcohol use: Yes    Alcohol/week: 70.0 standard drinks    Types: 70 Cans of beer per week    Comment: 8-10 drinks per day  . Drug use: No  . Sexual activity: Yes    Birth control/protection: None  Lifestyle  . Physical activity    Days per week: Not on file    Minutes per session: Not on file  . Stress: Not on file  Relationships  . Social Herbalist on phone: Not on file    Gets together: Not on file    Attends religious service: Not on file    Active member of club or organization: Not on file    Attends meetings of clubs or organizations: Not on file    Relationship status: Not on file  . Intimate partner violence    Fear of current or ex partner: Not on file    Emotionally abused: Not on file    Physically abused: Not on file    Forced sexual activity: Not on file  Other  Topics Concern  . Not on file  Social History Narrative  . Not on file    Past Medical History:  Diagnosis Date  . Elevated PSA   . Erectile dysfunction   . Hyperlipidemia   . Hypertension   . Sciatica    right     Patient Active Problem List   Diagnosis Date Noted  . Atypical small acinar proliferation of prostate 07/15/2018  . Right shoulder pain 04/14/2017  . PIN (prostatic intraepithelial neoplasia) 08/11/2016  . Benign localized hyperplasia of prostate with urinary obstruction 08/11/2016  . Incomplete emptying of bladder 08/11/2016  . Elevated PSA 05/13/2016  . Neuralgia neuritis, sciatic nerve 08/08/2015  . Palpitations 08/08/2015  . Absolute anemia 02/26/2009  . Toxic effect of venom 02/07/2009  . Essential (primary) hypertension 07/20/2007  . Combined fat and carbohydrate induced hyperlipemia 05/25/2004  . ED (erectile dysfunction) of organic origin 05/26/2003    Past Surgical History:  Procedure Laterality Date  . COLONOSCOPY N/A 02/24/2017   Procedure: COLONOSCOPY;  Surgeon: Lin Landsman, MD;  Location: [facility];  Service: Endoscopy;  Laterality: N/A;  . NASAL  FRACTURE SURGERY  2005  . PROSTATE BIOPSY    . TONSILLECTOMY  1972    His family history includes Heart attack in his father and mother. There is no history of Prostate cancer or Kidney cancer.   Current Outpatient Medications:  .  aspirin 81 MG tablet, Take 81 mg by mouth daily. , Disp: , Rfl:  .  diphenhydrAMINE (BENADRYL) 25 MG tablet, Take 25 mg by mouth at bedtime as needed., Disp: , Rfl:  .  Glucosamine Sulfate (GNP GLUCOSAME MAXIMUM STRENGTH) 1000 MG TABS, Take by mouth daily. , Disp: , Rfl:  .  hydrochlorothiazide (HYDRODIURIL) 25 MG tablet, TAKE 1 TABLET DAILY, Disp: 30 tablet, Rfl: 2 .  MULTIPLE VITAMIN PO, Take by mouth daily., Disp: , Rfl:  .  sildenafil (VIAGRA) 100 MG tablet, Take 1 tablet (100 mg total) by mouth daily as needed for erectile dysfunction., Disp: 88  tablet, Rfl: 4 .  simvastatin (ZOCOR) 20 MG tablet, TAKE 1 TABLET AT BEDTIME, Disp: 30 tablet, Rfl: 2  Patient Care Team: Birdie Sons, MD as PCP - General (Family Medicine) Murrell Redden, MD as Consulting Physician (Urology) Dasher, Rayvon Char, MD as Consulting Physician (Dermatology) Lin Landsman, MD as Consulting Physician (Gastroenterology)    Objective:    Vitals: BP 128/74 (BP Location: Left Arm, Patient Position: Sitting, Cuff Size: Large)   Pulse 81   Temp 97.7 F (36.5 C) (Temporal)   Resp 16   Ht 5\' 6"  (1.676 m)   Wt 187 lb (84.8 kg)   SpO2 97% Comment: room air  BMI 30.18 kg/m   Physical Exam  Activities of Daily Living In your present state of health, do you have any difficulty performing the following activities: 05/02/2019  Hearing? Y  Vision? N  Difficulty concentrating or making decisions? N  Walking or climbing stairs? N  Dressing or bathing? N  Doing errands, shopping? N  Some recent data might be hidden    Fall Risk Assessment Fall Risk  05/02/2019 04/14/2017 03/26/2016  Falls in the past year? - No No  Number falls in past yr: 0 - -  Injury with Fall? 0 - -  Follow up Falls evaluation completed - -     Depression Screen PHQ 2/9 Scores 05/02/2019 04/14/2017 04/14/2017 03/26/2016  PHQ - 2 Score 0 0 0 0  PHQ- 9 Score 0 1 - -    6CIT Screen 03/26/2016  What Year? 0 points  What month? 0 points  What time? 0 points  Count back from 20 0 points  Months in reverse 0 points  Repeat phrase 2 points  Total Score 2      Assessment & Plan:     Annual Wellness Visit  Reviewed patient's Family Medical History Reviewed and updated list of patient's medical providers Assessment of cognitive impairment was done Assessed patient's functional ability Established a written schedule for health screening [place] Completed and Reviewed  Exercise Activities and Dietary recommendations Goals    . exercise/water      Starting 03/26/16, I will continue to exercise daily and drink 6-8 glasses of water a day.    . Reduce alcohol intake     Recommend cutting back on consumption of beer that's drank weekly. Pt states he will cut back by half (around 35 beers a week).       Immunization History  Administered Date(s) Administered  . Hepatitis A 06/10/2000, 12/07/2000  . Influenza, High Dose Seasonal PF 04/28/2017, 03/16/2018,  02/23/2019  . Pneumococcal Conjugate-13 10/16/2014  . Pneumococcal Polysaccharide-23 03/27/2016  . Td 03/27/1997, 03/09/2008  . Tdap 03/09/2008  . Typhoid Inactivated 06/10/2000  . Zoster 03/16/2011    Health Maintenance  Topic Date Due  . INFLUENZA VACCINE  12/24/2018  . TETANUS/TDAP  03/26/2026 (Originally 03/09/2018)  . COLONOSCOPY  02/25/2027  . Hepatitis C Screening  Completed  . PNA vac Low Risk Adult  Completed     Discussed health benefits of physical activity, and encouraged him to engage in regular exercise appropriate for his age and condition.    ------------------------------------------------------------------------------------------------------------    Lelon Huh, MD  [place]

## 2019-05-02 NOTE — Progress Notes (Signed)
Patient: Jason Holmes, Male    DOB: 17-Mar-1951, 68 y.o.   MRN: [id number] Visit Date: 05/02/2019  Today's Provider: Lelon Huh, MD   Chief Complaint  Patient presents with  . Hyperlipidemia  . Hypertension  . Annual Exam   Subjective:     Annual physical Jason Holmes is a 68 y.o. male. He feels fairly well. He reports exercising 6 times weekly. He reports he is sleeping well.  -----------------------------------------------------------   Lipid/Cholesterol, Follow-up:   Last seen for this  years ago.  Management changes since that visit include none. . Last Lipid Panel:    Component Value Date/Time   CHOL 221 (H) 04/22/2017 0924   CHOL 199 05/12/2016 0954   TRIG 80 04/22/2017 0924   HDL 101 04/22/2017 0924   HDL 99 05/12/2016 0954   CHOLHDL 2.2 04/22/2017 0924   LDLCALC 103 (H) 04/22/2017 0924    Risk factors for vascular disease include hypercholesterolemia  He reports good compliance with treatment. He is not having side effects.  Current symptoms include none and have been stable. Weight trend: fluctuating a bit Prior visit with dietician: no Current diet: well balanced Current exercise: walking  Wt Readings from Last 3 Encounters:  05/02/19 187 lb (84.8 kg)  12/15/17 179 lb 9.6 oz (81.5 kg)  04/14/17 182 lb 6.4 oz (82.7 kg)    -------------------------------------------------------------------  Hypertension, follow-up:  BP Readings from Last 3 Encounters:  05/02/19 128/74  07/15/18 (!) 155/74  12/15/17 (!) 143/88    He was last seen for hypertension 2 years ago.  BP at that visit was 134/78. Management since that visit includes no changes. He reports good compliance with treatment. He is not having side effects.  He is exercising. He is adherent to low salt diet.   Outside blood pressures are checked occasionally. He is experiencing none.  Patient denies chest pain, chest pressure/discomfort, claudication, dyspnea,  exertional chest pressure/discomfort, fatigue, irregular heart beat, lower extremity edema, near-syncope, orthopnea, palpitations, paroxysmal nocturnal dyspnea, syncope and tachypnea.   Cardiovascular risk factors include advanced age (older than 40 for men, 28 for women), dyslipidemia, hypertension and male gender.  Use of agents associated with hypertension: NSAIDS.     Weight trend: fluctuating a bit Wt Readings from Last 3 Encounters:  05/02/19 187 lb (84.8 kg)  12/15/17 179 lb 9.6 oz (81.5 kg)  04/14/17 182 lb 6.4 oz (82.7 kg)    Current diet: well balanced  ------------------------------------------------------------------------  Review of Systems  Constitutional: Negative for appetite change, chills, fatigue and fever.  HENT: Negative for congestion, ear pain, hearing loss, nosebleeds and trouble swallowing.   Eyes: Negative for pain and visual disturbance.  Respiratory: Negative for cough, chest tightness and shortness of breath.   Cardiovascular: Negative for chest pain, palpitations and leg swelling.  Gastrointestinal: Negative for abdominal pain, blood in stool, constipation, diarrhea, nausea and vomiting.  Endocrine: Negative for polydipsia, polyphagia and polyuria.  Genitourinary: Negative for dysuria and flank pain.  Musculoskeletal: Negative for arthralgias, back pain, joint swelling, myalgias and neck stiffness.  Skin: Negative for color change, rash and wound.  Neurological: Negative for dizziness, tremors, seizures, speech difficulty, weakness, light-headedness and headaches.  Psychiatric/Behavioral: Negative for behavioral problems, confusion, decreased concentration, dysphoric mood and sleep disturbance. The patient is not nervous/anxious.   All other systems reviewed and are negative.   Social History   Socioeconomic History  . Marital status: Married    Spouse name: Not on file  .  Number of children: Not on file  . Years of education: Not on file  .  Highest education level: Not on file  Occupational History  . Not on file  Social Needs  . Financial resource strain: Not on file  . Food insecurity    Worry: Not on file    Inability: Not on file  . Transportation needs    Medical: Not on file    Non-medical: Not on file  Tobacco Use  . Smoking status: Former Smoker    Types: Cigarettes    Quit date: 01/30/2003    Years since quitting: 16.2  . Smokeless tobacco: Current User    Types: Chew  Substance and Sexual Activity  . Alcohol use: Yes    Alcohol/week: 70.0 standard drinks    Types: 70 Cans of beer per week    Comment: 8-10 drinks per day  . Drug use: No  . Sexual activity: Yes    Birth control/protection: None  Lifestyle  . Physical activity    Days per week: Not on file    Minutes per session: Not on file  . Stress: Not on file  Relationships  . Social Herbalist on phone: Not on file    Gets together: Not on file    Attends religious service: Not on file    Active member of club or organization: Not on file    Attends meetings of clubs or organizations: Not on file    Relationship status: Not on file  . Intimate partner violence    Fear of current or ex partner: Not on file    Emotionally abused: Not on file    Physically abused: Not on file    Forced sexual activity: Not on file  Other Topics Concern  . Not on file  Social History Narrative  . Not on file    Past Medical History:  Diagnosis Date  . Elevated PSA   . Erectile dysfunction   . Hyperlipidemia   . Hypertension   . Sciatica    right     Patient Active Problem List   Diagnosis Date Noted  . Atypical small acinar proliferation of prostate 07/15/2018  . Right shoulder pain 04/14/2017  . PIN (prostatic intraepithelial neoplasia) 08/11/2016  . Benign localized hyperplasia of prostate with urinary obstruction 08/11/2016  . Incomplete emptying of bladder 08/11/2016  . Elevated PSA 05/13/2016  . Neuralgia neuritis, sciatic nerve  08/08/2015  . Palpitations 08/08/2015  . Absolute anemia 02/26/2009  . Toxic effect of venom 02/07/2009  . Essential (primary) hypertension 07/20/2007  . Combined fat and carbohydrate induced hyperlipemia 05/25/2004  . ED (erectile dysfunction) of organic origin 05/26/2003    Past Surgical History:  Procedure Laterality Date  . COLONOSCOPY N/A 02/24/2017   Procedure: COLONOSCOPY;  Surgeon: Lin Landsman, MD;  Location: [facility];  Service: Endoscopy;  Laterality: N/A;  . NASAL FRACTURE SURGERY  2005  . PROSTATE BIOPSY    . TONSILLECTOMY  1972    His family history includes Heart attack in his father and mother. There is no history of Prostate cancer or Kidney cancer.   Current Outpatient Medications:  .  aspirin 81 MG tablet, Take 81 mg by mouth daily. , Disp: , Rfl:  .  diphenhydrAMINE (BENADRYL) 25 MG tablet, Take 25 mg by mouth at bedtime as needed., Disp: , Rfl:  .  Glucosamine Sulfate (GNP GLUCOSAME MAXIMUM STRENGTH) 1000 MG TABS, Take by mouth daily. , Disp: ,  Rfl:  .  hydrochlorothiazide (HYDRODIURIL) 25 MG tablet, TAKE 1 TABLET DAILY, Disp: 30 tablet, Rfl: 2 .  MULTIPLE VITAMIN PO, Take by mouth daily., Disp: , Rfl:  .  sildenafil (VIAGRA) 100 MG tablet, Take 1 tablet (100 mg total) by mouth daily as needed for erectile dysfunction., Disp: 88 tablet, Rfl: 4 .  simvastatin (ZOCOR) 20 MG tablet, TAKE 1 TABLET AT BEDTIME, Disp: 30 tablet, Rfl: 2  Patient Care Team: Birdie Sons, MD as PCP - General (Family Medicine) Murrell Redden, MD as Consulting Physician (Urology) Dasher, Rayvon Char, MD as Consulting Physician (Dermatology) Lin Landsman, MD as Consulting Physician (Gastroenterology)    Objective:    Vitals: BP 128/74 (BP Location: Left Arm, Patient Position: Sitting, Cuff Size: Large)   Pulse 81   Temp 97.7 F (36.5 C) (Temporal)   Resp 16   Ht 5\' 6"  (1.676 m)   Wt 187 lb (84.8 kg)   SpO2 97% Comment: room air  BMI 30.18 kg/m   Physical  Exam   General Appearance:    Obese male. Alert, cooperative, in no acute distress, appears stated age  Head:    Normocephalic, without obvious abnormality, atraumatic  Eyes:    PERRL, conjunctiva/corneas clear, EOM's intact, fundi    benign, both eyes       Ears:    Normal TM's and external ear canals, both ears  Nose:   Nares normal, septum midline, mucosa normal, no drainage   or sinus tenderness  Throat:   Lips, mucosa, and tongue normal; teeth and gums normal  Neck:   Supple, symmetrical, trachea midline, no adenopathy;       thyroid:  No enlargement/tenderness/nodules; no carotid   bruit or JVD  Back:     Symmetric, no curvature, ROM normal, no CVA tenderness  Lungs:     Clear to auscultation bilaterally, respirations unlabored  Chest wall:    No tenderness or deformity  Heart:    Normal heart rate. Normal rhythm. No murmurs, rubs, or gallops.  S1 and S2 normal  Abdomen:     Soft, non-tender, bowel sounds active all four quadrants,    no masses, no organomegaly  Genitalia:    deferred  Rectal:    deferred  Extremities:   All extremities are intact. No cyanosis or edema  Pulses:   2+ and symmetric all extremities  Skin:   Skin color, texture, turgor normal, no rashes or lesions  Lymph nodes:   Cervical, supraclavicular, and axillary nodes normal  Neurologic:   CNII-XII intact. Normal strength, sensation and reflexes      throughout    Activities of Daily Living In your present state of health, do you have any difficulty performing the following activities: 05/02/2019  Hearing? Y  Vision? N  Difficulty concentrating or making decisions? N  Walking or climbing stairs? N  Dressing or bathing? N  Doing errands, shopping? N  Some recent data might be hidden    Fall Risk Assessment Fall Risk  05/02/2019 04/14/2017 03/26/2016  Falls in the past year? - No No  Number falls in past yr: 0 - -  Injury with Fall? 0 - -  Follow up Falls evaluation completed - -     Depression  Screen PHQ 2/9 Scores 05/02/2019 04/14/2017 04/14/2017 03/26/2016  PHQ - 2 Score 0 0 0 0  PHQ- 9 Score 0 1 - -    Cognitive Testing - 6-CIT  Correct? Score   What year is it?  yes 0 0 or 4  What month is it? yes 0 0 or 3  Memorize:    Pia Mau,  40,  [address]  [place],      What time is it? (within 1 hour) yes 0 0 or 3  Count backwards from 20 yes 0 0, 2, or 4  Name the months of the year yes 0 0, 2, or 4  Repeat name & address above yes 0 0, 2, 4, 6, 8, or 10       TOTAL SCORE  0/28   Interpretation:  Normal  Normal (0-7) Abnormal (8-28)      Assessment & Plan:     Annual Wellness Visit  Reviewed patient's Family Medical History Reviewed and updated list of patient's medical providers Assessment of cognitive impairment was done Assessed patient's functional ability Established a written schedule for health screening [place] Completed and Reviewed  Exercise Activities and Dietary recommendations Goals    . exercise/water     Starting 03/26/16, I will continue to exercise daily and drink 6-8 glasses of water a day.    . Reduce alcohol intake     Recommend cutting back on consumption of beer that's drank weekly. Pt states he will cut back by half (around 35 beers a week).       Immunization History  Administered Date(s) Administered  . Hepatitis A 06/10/2000, 12/07/2000  . Influenza, High Dose Seasonal PF 04/28/2017, 03/16/2018, 02/23/2019  . Pneumococcal Conjugate-13 10/16/2014  . Pneumococcal Polysaccharide-23 03/27/2016  . Td 03/27/1997, 03/09/2008  . Tdap 03/09/2008  . Typhoid Inactivated 06/10/2000  . Zoster 03/16/2011    Health Maintenance  Topic Date Due  . INFLUENZA VACCINE  12/24/2018  . TETANUS/TDAP  03/26/2026 (Originally 03/09/2018)  . COLONOSCOPY  02/25/2027  . Hepatitis C Screening  Completed  . PNA vac Low Risk Adult  Completed     Discussed health benefits of physical activity, and encouraged him to engage in  regular exercise appropriate for his age and condition.    ------------------------------------------------------------------------------------------------------------  2. Essential (primary) hypertension Well controlled.  Continue current medications.   - hydrochlorothiazide (HYDRODIURIL) 25 MG tablet; Take 1 tablet (25 mg total) by mouth daily.  Dispense: 90 tablet; Refill: 3 - Comprehensive metabolic panel - Lipid panel (fasting)  3. Combined fat and carbohydrate induced hyperlipemia He is tolerating simvastatin well with no adverse effects.   - simvastatin (ZOCOR) 20 MG tablet; Take 1 tablet (20 mg total) by mouth at bedtime.  Dispense: 90 tablet; Refill: 3 - Comprehensive metabolic panel - Lipid panel (fasting) - CBC  4.Excessive drinking of alcohol Counseled to reduce alcohol intake, counseled on benefit of naltrexone in reducing alcohol cravings. He will call back for prescription if he decides to try it.   The entirety of the information documented in the History of Present Illness, Review of Systems and Physical Exam were personally obtained by me. Portions of this information were initially documented by Meyer Cory, CMA and reviewed by me for thoroughness and accuracy.      Lelon Huh, MD  [facility]

## 2019-05-02 NOTE — Patient Instructions (Addendum)
.   Limit alcohol to no more than 2 servings in a day and no more than 10 servings in a week   If you have trouble cutting back, we can try a prescription for naltrexone which most people find helps cut down on alcohol craving. Just give me a call or send a MyChart message if you decide to try this medication.  Please go to the lab draw station in [address] on the second floor of [facility]  when you are fasting for 8 hours. Normal hours are 8:00am to 12:30pm and 1:30pm to 4:00pm Monday through Friday

## 2019-05-05 DIAGNOSIS — I10 Essential (primary) hypertension: Secondary | ICD-10-CM | POA: Diagnosis not present

## 2019-05-05 DIAGNOSIS — E782 Mixed hyperlipidemia: Secondary | ICD-10-CM | POA: Diagnosis not present

## 2019-05-06 LAB — LIPID PANEL
Chol/HDL Ratio: 2.6 ratio (ref 0.0–5.0)
Cholesterol, Total: 217 mg/dL — ABNORMAL HIGH (ref 100–199)
HDL: 84 mg/dL (ref >39)
LDL Chol Calc (NIH): 116 mg/dL — ABNORMAL HIGH (ref 0–99)
Triglycerides: 97 mg/dL (ref 0–149)
VLDL Cholesterol Cal: 17 mg/dL (ref 5–40)

## 2019-05-06 LAB — COMPREHENSIVE METABOLIC PANEL
ALT: 23 IU/L (ref 0–44)
AST: 29 IU/L (ref 0–40)
Albumin/Globulin Ratio: 2 (ref 1.2–2.2)
Albumin: 4.7 g/dL (ref 3.8–4.8)
Alkaline Phosphatase: 62 IU/L (ref 39–117)
BUN/Creatinine Ratio: 14 (ref 10–24)
BUN: 11 mg/dL (ref 8–27)
Bilirubin Total: 0.4 mg/dL (ref 0.0–1.2)
CO2: 24 mmol/L (ref 20–29)
Calcium: 10.3 mg/dL — ABNORMAL HIGH (ref 8.6–10.2)
Chloride: 95 mmol/L — ABNORMAL LOW (ref 96–106)
Creatinine, Ser: 0.81 mg/dL (ref 0.76–1.27)
GFR calc Af Amer: 104 mL/min/{1.73_m2} (ref >59)
GFR calc non Af Amer: 90 mL/min/{1.73_m2} (ref >59)
Globulin, Total: 2.4 g/dL (ref 1.5–4.5)
Glucose: 85 mg/dL (ref 65–99)
Potassium: 4.2 mmol/L (ref 3.5–5.2)
Sodium: 135 mmol/L (ref 134–144)
Total Protein: 7.1 g/dL (ref 6.0–8.5)

## 2019-05-06 LAB — CBC
Hematocrit: 35.4 % — ABNORMAL LOW (ref 37.5–51.0)
Hemoglobin: 12.2 g/dL — ABNORMAL LOW (ref 13.0–17.7)
MCH: 31 pg (ref 26.6–33.0)
MCHC: 34.5 g/dL (ref 31.5–35.7)
MCV: 90 fL (ref 79–97)
Platelets: 280 10*3/uL (ref 150–450)
RBC: 3.93 x10E6/uL — ABNORMAL LOW (ref 4.14–5.80)
RDW: 12.4 % (ref 11.6–15.4)
WBC: 5.1 10*3/uL (ref 3.4–10.8)

## 2019-07-06 ENCOUNTER — Telehealth: Payer: Self-pay

## 2019-07-06 NOTE — Telephone Encounter (Signed)
Copied from [place] [id number]. Topic: General - Other >> Jul 06, 2019  8:29 AM Alanda Slim E wrote: Reason for CRM: Pt wanted to have on file that he received his 1st covid vaccine on 2.10.21 with [facility]

## 2019-07-06 NOTE — Telephone Encounter (Signed)
Patient advised that we would need the vaccine documentation before vaccine can be added to his chart. Patient verbalized understanding.

## 2019-07-12 DIAGNOSIS — L821 Other seborrheic keratosis: Secondary | ICD-10-CM | POA: Diagnosis not present

## 2019-07-12 DIAGNOSIS — L111 Transient acantholytic dermatosis [Grover]: Secondary | ICD-10-CM | POA: Diagnosis not present

## 2019-07-12 DIAGNOSIS — D2261 Melanocytic nevi of right upper limb, including shoulder: Secondary | ICD-10-CM | POA: Diagnosis not present

## 2019-07-12 DIAGNOSIS — D2262 Melanocytic nevi of left upper limb, including shoulder: Secondary | ICD-10-CM | POA: Diagnosis not present

## 2019-07-12 DIAGNOSIS — Z872 Personal history of diseases of the skin and subcutaneous tissue: Secondary | ICD-10-CM | POA: Diagnosis not present

## 2019-07-12 DIAGNOSIS — L570 Actinic keratosis: Secondary | ICD-10-CM | POA: Diagnosis not present

## 2019-07-12 DIAGNOSIS — D225 Melanocytic nevi of trunk: Secondary | ICD-10-CM | POA: Diagnosis not present

## 2019-07-12 DIAGNOSIS — X32XXXA Exposure to sunlight, initial encounter: Secondary | ICD-10-CM | POA: Diagnosis not present

## 2019-07-12 DIAGNOSIS — Z85828 Personal history of other malignant neoplasm of skin: Secondary | ICD-10-CM | POA: Diagnosis not present

## 2019-07-18 ENCOUNTER — Other Ambulatory Visit: Payer: Self-pay | Admitting: *Deleted

## 2019-07-18 DIAGNOSIS — R69 Illness, unspecified: Secondary | ICD-10-CM | POA: Diagnosis not present

## 2019-07-18 DIAGNOSIS — R972 Elevated prostate specific antigen [PSA]: Secondary | ICD-10-CM

## 2019-07-19 ENCOUNTER — Other Ambulatory Visit: Payer: Medicare HMO

## 2019-07-19 ENCOUNTER — Other Ambulatory Visit: Payer: Self-pay

## 2019-07-19 DIAGNOSIS — R972 Elevated prostate specific antigen [PSA]: Secondary | ICD-10-CM

## 2019-07-20 LAB — PSA: Prostate Specific Ag, Serum: 8.6 ng/mL — ABNORMAL HIGH (ref 0.0–4.0)

## 2019-07-21 ENCOUNTER — Encounter: Payer: Self-pay | Admitting: Urology

## 2019-07-21 ENCOUNTER — Ambulatory Visit: Payer: Medicare HMO | Admitting: Urology

## 2019-07-21 ENCOUNTER — Other Ambulatory Visit: Payer: Self-pay

## 2019-07-21 VITALS — BP 127/79 | HR 76 | Ht 67.0 in | Wt 178.0 lb

## 2019-07-21 DIAGNOSIS — N401 Enlarged prostate with lower urinary tract symptoms: Secondary | ICD-10-CM | POA: Diagnosis not present

## 2019-07-21 DIAGNOSIS — N4231 Prostatic intraepithelial neoplasia: Secondary | ICD-10-CM

## 2019-07-21 DIAGNOSIS — N4232 Atypical small acinar proliferation of prostate: Secondary | ICD-10-CM | POA: Diagnosis not present

## 2019-07-21 DIAGNOSIS — N138 Other obstructive and reflux uropathy: Secondary | ICD-10-CM | POA: Diagnosis not present

## 2019-07-21 DIAGNOSIS — R972 Elevated prostate specific antigen [PSA]: Secondary | ICD-10-CM | POA: Diagnosis not present

## 2019-07-21 NOTE — Progress Notes (Signed)
07/21/2019 11:13 AM   Jason Holmes 1950/12/23 [id number]  Referring provider: Birdie Sons, MD [address] [place],  [place] [address]  Chief Complaint  Patient presents with  . Elevated PSA    Urologic history: 1.Elevated PSA -Prostate biopsy April 2018 for PSA of 6.8/right prostate nodule; prostate volume 44.5 cc;pathology HGPIN and focus ASAP -Prostate MRI 06/2017 consistent with BPH/no suspicious lesions -PSA bump July 2019 8.6   HPI: 69 YO male presents for semiannual follow-up.  PSA in August had bumped to 9.7.  Correspondence with my chart reviewed options of repeating a standard prostate biopsy, repeating prostate MRI and continued surveillance.  He elected the latter.  His repeat PSA drawn 2/24 was lower at 8.6.  He has mild urinary hesitancy and decreased stream which are not bothersome.  PSA trend:     PMH: Past Medical History:  Diagnosis Date  . Elevated PSA   . Erectile dysfunction   . Hyperlipidemia   . Hypertension   . Sciatica    right    Surgical History: Past Surgical History:  Procedure Laterality Date  . COLONOSCOPY N/A 02/24/2017   Procedure: COLONOSCOPY;  Surgeon: Lin Landsman, MD;  Location: [facility];  Service: Endoscopy;  Laterality: N/A;  . NASAL FRACTURE SURGERY  2005  . PROSTATE BIOPSY    . TONSILLECTOMY  1972    Home Medications:  Allergies as of 07/21/2019      Reactions   Lovastatin       Medication List       Accurate as of July 21, 2019 11:13 AM. If you have any questions, ask your nurse or doctor.        aspirin 81 MG tablet Take 81 mg by mouth daily.   diphenhydrAMINE 25 MG tablet Commonly known as: BENADRYL Take 25 mg by mouth at bedtime as needed.   GNP Glucosame Maximum Strength 1000 MG Tabs Generic drug: Glucosamine Sulfate Take by mouth daily.   hydrochlorothiazide 25 MG tablet Commonly known as: HYDRODIURIL Take 1 tablet (25 mg total) by mouth daily.     MULTIPLE VITAMIN PO Take by mouth daily.   sildenafil 100 MG tablet Commonly known as: VIAGRA Take 1 tablet (100 mg total) by mouth daily as needed for erectile dysfunction.   simvastatin 20 MG tablet Commonly known as: ZOCOR Take 1 tablet (20 mg total) by mouth at bedtime.       Allergies:  Allergies  Allergen Reactions  . Lovastatin     Family History: Family History  Problem Relation Age of Onset  . Heart attack Mother   . Heart attack Father   . Prostate cancer Neg Hx   . Kidney cancer Neg Hx     Social History:  reports that he quit smoking about 16 years ago. His smoking use included cigarettes. His smokeless tobacco use includes chew. He reports current alcohol use of about 70.0 standard drinks of alcohol per week. He reports that he does not use drugs.   Physical Exam: BP 127/79   Pulse 76   Ht 5\' 7"  (1.702 m)   Wt 178 lb (80.7 kg)   BMI 27.88 kg/m   Constitutional:  Alert and oriented, No acute distress. HEENT: [place] AT, moist mucus membranes.  Trachea midline, no masses. Cardiovascular: No clubbing, cyanosis, or edema. Respiratory: Normal respiratory effort, no increased work of breathing. GI: Abdomen is soft, nontender, nondistended, no abdominal masses GU: Prostate 50 g, smooth; stable asymmetry L >R  Neurologic: Grossly intact, no focal deficits, moving all 4 extremities. Psychiatric: Normal mood and affect.   Assessment & Plan:    - Elevated PSA Most recent bump in PSA back to the stable values which are slightly above baseline.  Prostate MRI showed no suspicious lesions.  He desires to continue surveillance.  Follow-up lab visit/PSA 6 months and office visit/DRE/PSA 1 year.  - BPH with lower urinary tract symptoms Mild LUTS which are not bothersome   [name], [place] [address] [place], [place] [address] [phone]

## 2019-09-27 ENCOUNTER — Telehealth: Payer: Self-pay | Admitting: Radiology

## 2019-09-27 NOTE — Telephone Encounter (Signed)
error 

## 2019-11-03 DIAGNOSIS — L570 Actinic keratosis: Secondary | ICD-10-CM | POA: Diagnosis not present

## 2019-11-03 DIAGNOSIS — L538 Other specified erythematous conditions: Secondary | ICD-10-CM | POA: Diagnosis not present

## 2019-11-03 DIAGNOSIS — L820 Inflamed seborrheic keratosis: Secondary | ICD-10-CM | POA: Diagnosis not present

## 2019-11-14 ENCOUNTER — Encounter: Payer: Self-pay | Admitting: Family Medicine

## 2019-11-14 DIAGNOSIS — F1010 Alcohol abuse, uncomplicated: Secondary | ICD-10-CM

## 2019-11-14 MED ORDER — NALTREXONE HCL 50 MG PO TABS: 50.0000 mg | ORAL_TABLET | Freq: Every day | ORAL | 3 refills | Status: DC

## 2019-11-23 ENCOUNTER — Other Ambulatory Visit: Payer: Self-pay

## 2019-11-23 ENCOUNTER — Encounter: Payer: Self-pay | Admitting: Adult Health

## 2019-11-23 ENCOUNTER — Ambulatory Visit (INDEPENDENT_AMBULATORY_CARE_PROVIDER_SITE_OTHER): Payer: Medicare HMO | Admitting: Adult Health

## 2019-11-23 VITALS — BP 134/82 | HR 73 | Temp 97.3°F | Ht 67.0 in | Wt 185.2 lb

## 2019-11-23 DIAGNOSIS — R69 Illness, unspecified: Secondary | ICD-10-CM | POA: Diagnosis not present

## 2019-11-23 DIAGNOSIS — R1013 Epigastric pain: Secondary | ICD-10-CM | POA: Diagnosis not present

## 2019-11-23 DIAGNOSIS — K279 Peptic ulcer, site unspecified, unspecified as acute or chronic, without hemorrhage or perforation: Secondary | ICD-10-CM

## 2019-11-23 DIAGNOSIS — F1010 Alcohol abuse, uncomplicated: Secondary | ICD-10-CM | POA: Diagnosis not present

## 2019-11-23 MED ORDER — OMEPRAZOLE 40 MG PO CPDR: 40.0000 mg | DELAYED_RELEASE_CAPSULE | Freq: Every day | ORAL | 0 refills | Status: DC

## 2019-11-23 NOTE — Progress Notes (Signed)
Established patient visit   Patient: Jason Holmes   DOB: [birth year]   69 y.o. Male  MRN: [id number] Visit Date: 11/23/2019  Today's healthcare provider: Marcille Buffy, FNP   Chief Complaint  Patient presents with  . Abdominal Pain   I,Latasha Walston,acting as a scribe for [employer], FNP.,have documented all relevant documentation on the behalf of Marcille Buffy, FNP,as directed by  Marcille Buffy, FNP while in the presence of Marcille Buffy, [facility].  Subjective    Abdominal Pain This is a new problem. The current episode started more than 1 month ago. The onset quality is sudden. The problem occurs constantly. Progression since onset: worse in the morning. The pain is located in the generalized abdominal region. The quality of the pain is dull. The abdominal pain does not radiate. Pertinent negatives include no arthralgias, constipation, diarrhea, fever, frequency, headaches, myalgias, nausea or vomiting. Nothing aggravates the pain. The pain is relieved by eating. He has tried nothing for the symptoms. There is no history of abdominal surgery, colon cancer, Crohn's disease, gallstones, GERD, irritable bowel syndrome, pancreatitis, PUD or ulcerative colitis.   Bowel movements regular every day no blood or melena. Denies constipation.  Pain is epigastric.  He reports he wakes up feeling better and then quickly has to eat and gets relief for the past two weeks.  He does use ibuprofen. Alcohol heavy use he reports.   Patient  denies any fever, body aches,chills, rash, chest pain, shortness of breath, nausea, vomiting, or diarrhea.   Denies dizziness, lightheadedness, pre syncopal or syncopal episodes.    Patient Active Problem List   Diagnosis Date Noted  . Peptic ulcer 11/23/2019  . Epigastric pain 11/23/2019  . Excessive drinking of alcohol 11/23/2019  . Atypical small acinar proliferation of prostate 07/15/2018  . Right  shoulder pain 04/14/2017  . PIN (prostatic intraepithelial neoplasia) 08/11/2016  . Benign localized hyperplasia of prostate with urinary obstruction 08/11/2016  . Elevated PSA 05/13/2016  . Neuralgia neuritis, sciatic nerve 08/08/2015  . Palpitations 08/08/2015  . Absolute anemia 02/26/2009  . Toxic effect of venom 02/07/2009  . Essential (primary) hypertension 07/20/2007  . Combined fat and carbohydrate induced hyperlipemia 05/25/2004  . ED (erectile dysfunction) of organic origin 05/26/2003   Past Medical History:  Diagnosis Date  . Elevated PSA   . Erectile dysfunction   . Hyperlipidemia   . Hypertension   . Sciatica    right   Past Surgical History:  Procedure Laterality Date  . COLONOSCOPY N/A 02/24/2017   Procedure: COLONOSCOPY;  Surgeon: Lin Landsman, MD;  Location: [facility];  Service: Endoscopy;  Laterality: N/A;  . NASAL FRACTURE SURGERY  2005  . PROSTATE BIOPSY    . TONSILLECTOMY  1972   Social History   Tobacco Use  . Smoking status: Former Smoker    Types: Cigarettes    Quit date: 01/30/2003    Years since quitting: 16.8  . Smokeless tobacco: Current User    Types: Chew  Vaping Use  . Vaping Use: Never used  Substance Use Topics  . Alcohol use: Yes    Alcohol/week: 70.0 standard drinks    Types: 70 Cans of beer per week    Comment: 8-10 drinks per day  . Drug use: No   Social History   Socioeconomic History  . Marital status: Married    Spouse name: Not on file  . Number of children: Not on file  . Years  of education: Not on file  . Highest education level: Not on file  Occupational History  . Not on file  Tobacco Use  . Smoking status: Former Smoker    Types: Cigarettes    Quit date: 01/30/2003    Years since quitting: 16.8  . Smokeless tobacco: Current User    Types: Chew  Vaping Use  . Vaping Use: Never used  Substance and Sexual Activity  . Alcohol use: Yes    Alcohol/week: 70.0 standard drinks    Types: 70 Cans of  beer per week    Comment: 8-10 drinks per day  . Drug use: No  . Sexual activity: Yes    Birth control/protection: None  Other Topics Concern  . Not on file  Social History Narrative  . Not on file   Social Determinants of Health   Financial Resource Strain:   . Difficulty of Paying Living Expenses:   Food Insecurity:   . Worried About Charity fundraiser in the Last Year:   . Arboriculturist in the Last Year:   Transportation Needs:   . Film/video editor (Medical):   Marland Kitchen Lack of Transportation (Non-Medical):   Physical Activity:   . Days of Exercise per Week:   . Minutes of Exercise per Session:   Stress:   . Feeling of Stress :   Social Connections:   . Frequency of Communication with Friends and Family:   . Frequency of Social Gatherings with Friends and Family:   . Attends Religious Services:   . Active Member of Clubs or Organizations:   . Attends Archivist Meetings:   Marland Kitchen Marital Status:   Intimate Partner Violence:   . Fear of Current or Ex-Partner:   . Emotionally Abused:   Marland Kitchen Physically Abused:   . Sexually Abused:    Family Status  Relation Name Status  . Mother  Deceased at age 85  . Father  Deceased at age 1  . Sister  Alive  . Sister  Alive  . Neg Hx  (Not Specified)   Family History  Problem Relation Age of Onset  . Heart attack Mother   . Heart attack Father   . Prostate cancer Neg Hx   . Kidney cancer Neg Hx    Allergies  Allergen Reactions  . Lovastatin        Medications: Outpatient Medications Prior to Visit  Medication Sig  . aspirin 81 MG tablet Take 81 mg by mouth daily.   . diphenhydrAMINE (BENADRYL) 25 MG tablet Take 25 mg by mouth at bedtime as needed.  . Glucosamine Sulfate (GNP GLUCOSAME MAXIMUM STRENGTH) 1000 MG TABS Take by mouth daily.   . hydrochlorothiazide (HYDRODIURIL) 25 MG tablet Take 1 tablet (25 mg total) by mouth daily.  . MULTIPLE VITAMIN PO Take by mouth daily.  . naltrexone (DEPADE) 50 MG tablet  Take 1 tablet (50 mg total) by mouth daily.  . sildenafil (VIAGRA) 100 MG tablet Take 1 tablet (100 mg total) by mouth daily as needed for erectile dysfunction.  . simvastatin (ZOCOR) 20 MG tablet Take 1 tablet (20 mg total) by mouth at bedtime.   No facility-administered medications prior to visit.    Review of Systems  Constitutional: Negative.  Negative for chills, fatigue and fever.  HENT: Negative.   Respiratory: Negative.   Cardiovascular: Negative.   Gastrointestinal: Positive for abdominal pain (" feels like a dull burning improved with eating " ). Negative for constipation, diarrhea, nausea  and vomiting.  Genitourinary: Negative for frequency.  Musculoskeletal: Negative for arthralgias, back pain and myalgias.  Neurological: Negative for dizziness, seizures, facial asymmetry, speech difficulty, weakness, light-headedness and headaches.    Last CBC Lab Results  Component Value Date   WBC 5.1 05/05/2019   HGB 12.2 (L) 05/05/2019   HCT 35.4 (L) 05/05/2019   MCV 90 05/05/2019   MCH 31.0 05/05/2019   RDW 12.4 05/05/2019   PLT 280 11/18/9483   Last metabolic panel Lab Results  Component Value Date   GLUCOSE 85 05/05/2019   NA 135 05/05/2019   K 4.2 05/05/2019   CL 95 (L) 05/05/2019   CO2 24 05/05/2019   BUN 11 05/05/2019   CREATININE 0.81 05/05/2019   GFRNONAA 90 05/05/2019   GFRAA 104 05/05/2019   CALCIUM 10.3 (H) 05/05/2019   PROT 7.1 05/05/2019   ALBUMIN 4.7 05/05/2019   LABGLOB 2.4 05/05/2019   AGRATIO 2.0 05/05/2019   BILITOT 0.4 05/05/2019   ALKPHOS 62 05/05/2019   AST 29 05/05/2019   ALT 23 05/05/2019   Last lipids Lab Results  Component Value Date   CHOL 217 (H) 05/05/2019   HDL 84 05/05/2019   LDLCALC 116 (H) 05/05/2019   TRIG 97 05/05/2019   CHOLHDL 2.6 05/05/2019   Last hemoglobin A1c No results found for: HGBA1C Last thyroid functions No results found for: TSH, T3TOTAL, T4TOTAL, THYROIDAB Last vitamin D No results found for: 25OHVITD2,  25OHVITD3, VD25OH Last vitamin B12 and Folate No results found for: VITAMINB12, FOLATE    Objective    BP 134/82 (BP Location: Right Arm, Patient Position: Sitting, Cuff Size: Large)   Pulse 73   Temp (!) 97.3 F (36.3 C) (Temporal)   Ht 5\' 7"  (1.702 m)   Wt 185 lb 3.2 oz (84 kg)   BMI 29.01 kg/m  BP Readings from Last 3 Encounters:  11/23/19 134/82  07/21/19 127/79  05/02/19 128/74     Physical Exam Constitutional:      General: He is not in acute distress.    Appearance: He is well-developed. He is obese. He is not ill-appearing, toxic-appearing or diaphoretic.  HENT:     Head: Normocephalic and atraumatic.     Mouth/Throat:     Mouth: Mucous membranes are moist.  Cardiovascular:     Rate and Rhythm: Normal rate and regular rhythm.     Heart sounds: Normal heart sounds. No murmur heard.  No friction rub. No gallop.   Pulmonary:     Effort: Pulmonary effort is normal.     Breath sounds: Normal breath sounds.  Abdominal:     General: Abdomen is protuberant. Bowel sounds are normal. There is distension (appears mildly distented patient attributes it to his new wife's good cooking and weight gain. ). There is no abdominal bruit. There are no signs of injury.     Palpations: Abdomen is soft. There is no shifting dullness, fluid wave, hepatomegaly, splenomegaly, mass or pulsatile mass.     Tenderness: There is no abdominal tenderness. There is no right CVA tenderness, left CVA tenderness, guarding or rebound. Negative signs include Murphy's sign.     Hernia: No hernia is present.  Skin:    General: Skin is warm.     Capillary Refill: Capillary refill takes less than 2 seconds.     Coloration: Skin is not cyanotic, jaundiced, mottled or pale.     Findings: No erythema or rash.  Neurological:     General: No focal deficit present.  Mental Status: He is alert and oriented to person, place, and time.  Psychiatric:        Mood and Affect: Mood normal. Mood is not anxious  or depressed.        Behavior: Behavior normal.         No results found for any visits on 11/23/19.  Assessment & Plan      Peptic ulcer  Epigastric pain - Plan: CBC with Differential/Platelet, Comprehensive metabolic panel, Amylase, Lipase  Excessive drinking of alcohol    reports current alcohol use of about 70.0 standard drinks of alcohol per week.  Educated on decreasing and discontinuing alcohol.  Switch to Tylenol avoid NSAID'S for a few weeks.   Trial of Protonix.  Red Flags discussed. The patient was given clear instructions to go to ER or return to medical center if any red flags develop, symptoms do not improve, worsen or new problems develop. They verbalized understanding.   Meds ordered this encounter  Medications  . omeprazole (PRILOSEC) 40 MG capsule    Sig: Take 1 capsule (40 mg total) by mouth daily.    Dispense:  30 capsule    Refill:  0   After visit summary provided.  Advised patient call the office or your primary care doctor for an appointment if no improvement within 72 hours or if any symptoms change or worsen at any time  Advised ER or urgent Care if after hours or on weekend. Call 911 for emergency symptoms at any time.Patinet verbalized understanding of all instructions given/reviewed and treatment plan and has no further questions or concerns at this time.      Return in about 3 weeks (around 12/14/2019), or if symptoms worsen or fail to improve, for at any time for any worsening symptoms.      [place] Dhairya Corales, FNP, have reviewed all documentation for this visit. The documentation on 11/23/19 for the exam, diagnosis, procedures, and orders are all accurate and complete.   Marcille Buffy, [place] [phone] (phone) [phone] (fax)  [place]

## 2019-11-23 NOTE — Patient Instructions (Addendum)
Alcohol Use Education Information about Your Drinking Your score on the Alcohol Use Disorders Identification Test was: AUDIT C:    TOTAL AUDIT SCORE:   .  This score places you in the category of:  Score 0 = Abstainers Score 8-19 = Unhealthy/High Risk Drinkers  Score 1-7 = Low Risk Drinkers Score 20+ = Probable Alcohol Dependence   High Scores (20+) on the Alcohol Use Identification Test Consider becoming involved in a structured program.  You should stop drinking if: . You have tried to cut down before but have not been successful, or  . You suffer from morning shakes during a heavy drinking period, or . You have high blood pressure, or . You are pregnant, or . You have liver disease, or . You are taking medicines that react with alcohol, or . Your alcohol use is affecting your social relationships, or . You have legal consequences like DUIs, or . You call in sick to work, or . You cannot take care of our children, or . Someone close to you says you drink too much    How Much Alcohol is a Drink: Beer: 12 oz. = 1 drink 16 oz. = 1.3 drinks 22 oz. = 2 drinks 40 oz. = 3.3 drinks  Wine: 5 oz. = 1 drink 740 mL (25 oz.) bottle = 5 drinks Malt Liquor: 12 oz. = 1.5 drinks 16 oz. = 2 drinks 22 oz. = 2.5 drinks 40 oz. = 4.5 drinks  80-Proof Spirits - Hard Liquor: 1 shot = 1 drink 1 mixed drink = number of shots Can equal 1-3 drinks   What is Low-risk Drinking? . Have no more than 2 drinks of alcohol per day . Drink no more than 5 days per week . Do not drink alcohol drink alcohol when: - You drive or operate machinery - You are pregnant or breast feeding - You are taking medications that interact with alcohol - You have medical conditions made worse with alcohol - You can stop or control your drinking      Identify Your Triggers for Drinking . Parties . Particular People . Feeling lonely . Feeling tense . Family problems . Feeling sad . Feeling happy  . Feeling bored . After work . Problems sleeping . Criticism . Feelings of failure . After being paid . When others are drinking . In bars . When out for dinner . After arguments . Weekends . Feeling restless . Being in pain   Effects of High-Risk Drinking To the Brain: . Aggressive, irrational behavior . Arguments, violence . Depression, nervousness . Alcohol dependence, memory loss To the Nervous System: . Trembling hands, tingling fingers . Numbness, painful nerves . Impaired sensation leading to falls . Numb tingling toes To Your Lifestyle: . Social, legal, medical problems . Domestic trouble/relationship loss . Job loss & financial problems . Shortened life span . Accidents and death from drunk driving   To the Face: . Premature aging, drinker's nose . Cancer of the throat & mouth To the Body: . Frequent cold . Reduced resistance to infection . Increased risk of pneumonia . Weakness of heart muscle . Heart failure, anemia . Impaired blood clotting . Breast cancer . Vitamin deficiency, bleeding . Severe Inflammation of the stomach . Vomiting, diarrhea, malnutrition . Ulcer, inflammation of the pancreas . Impaired sexual performance . Birth defects, including deformities, retardation, and low birthweight   Ways to Cope Without Drinking . Go home if you tend to drink after work . Find another  activity . Switch to nonalcoholic beverages . Change friends . Join a club . Volunteer . Visit relatives . Plan/take a trip . Go for a walk . Take up a hobby . Listen to music . Talk to a friend . Reading . What would you do if you had no worries about failing?         Good Reasons for Drinking Less . I will live longer - probably 8-10 years. . I will sleep better. . I will be happier. . I will save a lot of money . My relationships will improve. . I will stay younger for longer. . I will achieve more in my life . There will be a greater chance that  I will survive to a healthy old age with no premature damage to my brain.  . I will be better at my job. . I will be less likely to feel depressed and commit suicide (6 times less likely). . I will be less likely to die of heart disease or cancer. . Other people will respect me . I will be less likely to get into trouble with the police. . The possibility that I will die of liver disease will be dramatically reduced (12 times less likely). . It will be less likely that I will die in a car accident (3 times less likely).   Strategies for Cutting Down Keep Track.  Find a way to keep track of how much you drink.  If you make a note of each drink before you drink it, this will help slow you down. Count and Measure.  Know the standard drink sizes.  Ask the bartender or server about the amount of alcohol in a mixed drink. Set Goals.  Decide how many days a week you will drink and how many drinks each day. Pace and Space.  When you do drink, pace yourself.  Have no more than one drink with alcohol per hour.  Alternate "drink spacers" non-alcoholic drinks such as water, soda, or juice with drinks containing alcohol. Include Food.  Don't drink on an empty stomach.  Have some food so the alcohol will be absorbed more slowly into your system.  Avoid Triggers.  Avoid people, places, or activities that have led to drinking in the past.  Certain times of day or feelings may also be triggers.  Make a plan so you will know what you can do instead of drinking. Plan to Handle Urges.  When an urge hits, consider these options:  Remind yourself of your reasons for changing.  Or talk it through with someone you trust. Or get involved with a healthy, distracting activity.  Or, "urge surf" - instead of fighting the feeling, accept it and ride it out, knowing it will soon crest like a wave and pass. Know Your "No".  Have a polite, convincing "no thanks" for those times when you may be offered a drink and don't want one.   The faster you can say no to these offers, the less likely you are to give in.  If you hesitate, it allows you time to think of excuses to go along.      Peptic Ulcer  A peptic ulcer is a painful sore in the lining of your stomach or the first part of your small intestine. What are the causes? Common causes of this condition include:  An infection.  Using certain pain medicines too often or too much. What increases the risk? You are more likely to get  this condition if you:  Smoke.  Have a family history of ulcer disease.  Drink alcohol.  Have been hospitalized in an intensive care unit (ICU). What are the signs or symptoms? Symptoms include:  Burning pain in the area between the chest and the belly button. The pain may: ? Not go away (be persistent). ? Be worse when your stomach is empty. ? Be worse at night.  Heartburn.  Feeling sick to your stomach (nauseous) and throwing up (vomiting).  Bloating. If the ulcer results in bleeding, it can cause you to:  Have poop (stool) that is black and looks like tar.  Throw up bright red blood.  Throw up material that looks like coffee grounds. How is this treated? Treatment for this condition may include:  Stopping things that can cause the ulcer, such as: ? Smoking. ? Using pain medicines.  Medicines to reduce stomach acid.  Antibiotic medicines if the ulcer is caused by an infection.  A procedure that is done using a small, flexible tube that has a camera at the end (upper endoscopy). This may be done if you have a bleeding ulcer.  Surgery. This may be needed if: ? You have a lot of bleeding. ? The ulcer caused a hole somewhere in the digestive system. Follow these instructions at home:  Do not drink alcohol if your doctor tells you not to drink.  Limit how much caffeine you take in.  Do not use any products that contain nicotine or tobacco, such as cigarettes, e-cigarettes, and chewing tobacco. If you need  help quitting, ask your doctor.  Take over-the-counter and prescription medicines only as told by your doctor. ? Do not stop or change your medicines unless you talk with your doctor about it first. ? Do not take aspirin, ibuprofen, or other NSAIDs unless your doctor told you to do so.  Keep all follow-up visits as told by your doctor. This is important. Contact a doctor if:  You do not get better in 7 days after you start treatment.  You keep having an upset stomach (indigestion) or heartburn. Get help right away if:  You have sudden, sharp pain in your belly (abdomen).  You have belly pain that does not go away.  You have bloody poop (stool) or black, tarry poop.  You throw up blood. It may look like coffee grounds.  You feel light-headed or feel like you may pass out (faint).  You get weak.  You get sweaty or feel sticky and cold to the touch (clammy). Summary  Symptoms of a peptic ulcer include burning pain in the area between the chest and the belly button.  Take medicines only as told by your doctor.  Limit how much alcohol and caffeine you have.  Keep all follow-up visits as told by your doctor. This information is not intended to replace advice given to you by your health care provider. Make sure you discuss any questions you have with your health care provider. Document Revised: 11/16/2017 Document Reviewed: 11/16/2017 Elsevier Patient Education  [place]. Omeprazole tablets (OTC) What is this medicine? OMEPRAZOLE (oh ME pray zol) prevents the production of acid in the stomach. It is used to treat the symptoms of heartburn. You can buy this medicine without a prescription. This product is not for long-term use, unless otherwise directed by your doctor or health care professional. This medicine may be used for other purposes; ask your health care provider or pharmacist if you have questions. COMMON BRAND NAME(S):  Prilosec OTC What should I tell my health  care provider before I take this medicine? They need to know if you have any of these conditions:  black or bloody stools  chest pain  difficulty swallowing  have had heartburn for over 3 months  have heartburn with dizziness, lightheadedness or sweating  liver disease  lupus  stomach pain  unexplained weight loss  vomiting with blood  wheezing  an unusual or allergic reaction to omeprazole, other medicines, foods, dyes, or preservatives  pregnant or trying to get pregnant  breast-feeding How should I use this medicine? Take this medicine by mouth with a glass of water. Follow the directions on the product label. Do not cut, crush or chew this medicine. Swallow the tablets whole. Take this medicine on an empty stomach, at least 30 minutes before breakfast. Take your medicine at regular intervals. Do not take it more often than directed. Talk to your pediatrician regarding the use of this medicine in children. Special care may be needed. Overdosage: If you think you have taken too much of this medicine contact a poison control center or emergency room at once. NOTE: This medicine is only for you. Do not share this medicine with others. What if I miss a dose? If you miss a dose, take it as soon as you can. If it is almost time for your next dose, take only that dose. Do not take double or extra doses. What may interact with this medicine? Do not take this medicine with any of the following medications:  atazanavir  clopidogrel  nelfinavir  rilpivirine This medicine may also interact with the following medications:  antifungals like itraconazole, ketoconazole, and voriconazole  certain antivirals for HIV or hepatitis  certain medicines that treat or prevent blood clots like warfarin  cilostazol  citalopram  cyclosporine  dasatinib  digoxin  disulfiram  diuretics  erlotinib  iron supplements  medicines for anxiety, panic, and sleep like  diazepam  medicines for seizures like carbamazepine, phenobarbital, phenytoin  methotrexate  mycophenolate mofetil  nilotinib  rifampin  St. John's wort  tacrolimus  vitamin B12 This list may not describe all possible interactions. Give your health care provider a list of all the medicines, herbs, non-prescription drugs, or dietary supplements you use. Also tell them if you smoke, drink alcohol, or use illegal drugs. Some items may interact with your medicine. What should I watch for while using this medicine? It can take several days before your heartburn gets better. Tell your healthcare professional if your symptoms do not start to get better or if they get worse. If you need to take this medicine for more than 14 days, talk to your healthcare professional. Heartburn may sometimes be caused by a more serious condition. This medicine may cause a decrease in vitamin B12. You should make sure that you get enough vitamin B12 while you are taking this medicine. Discuss the foods you eat and the vitamins you take with your health care professional. What side effects may I notice from receiving this medicine? Side effects that you should report to your doctor or health care professional as soon as possible:  allergic reactions like skin rash, itching or hives, swelling of the face, lips, or tongue  bone pain  breathing problems  fever or sore throat  joint pain  rash on cheeks or arms that gets worse in the sun  redness, blistering, peeling, or loosening of the skin, including inside the mouth  severe diarrhea  signs and  symptoms of kidney injury like trouble passing urine or change in the amount of urine  signs and symptoms of low magnesium like muscle cramps; muscle pain; muscle weakness; tremors; seizures; or fast, irregular heartbeat  stomach polyps  unusual bleeding or bruising Side effects that usually do not require medical attention (report to your doctor or health  care professional if they continue or are bothersome):  diarrhea  dry mouth  gas  headache  nausea  stomach pain This list may not describe all possible side effects. Call your doctor for medical advice about side effects. You may report side effects to FDA at 1-800-FDA-1088. Where should I keep my medicine? Keep out of the reach of children. Store at room temperature between 20 and 25 degrees C (68 and 77 degrees F). Protect from light and moisture. Throw away any unused medicine after the expiration date. NOTE: This sheet is a summary. It may not cover all possible information. If you have questions about this medicine, talk to your doctor, pharmacist, or health care provider.  2020 Elsevier/Gold Standard (2018-03-02 13:06:30)  Food Choices for Gastroesophageal Reflux Disease, Adult When you have gastroesophageal reflux disease (GERD), the foods you eat and your eating habits are very important. Choosing the right foods can help ease your discomfort. Think about working with a nutrition specialist (dietitian) to help you make good choices. What are tips for following this plan?  Meals  Choose healthy foods that are low in fat, such as fruits, vegetables, whole grains, low-fat dairy products, and lean meat, fish, and poultry.  Eat small meals often instead of 3 large meals a day. Eat your meals slowly, and in a place where you are relaxed. Avoid bending over or lying down until 2-3 hours after eating.  Avoid eating meals 2-3 hours before bed.  Avoid drinking a lot of liquid with meals.  Cook foods using methods other than frying. Bake, grill, or broil food instead.  Avoid or limit: ? Chocolate. ? Peppermint or spearmint. ? Alcohol. ? Pepper. ? Black and decaffeinated coffee. ? Black and decaffeinated tea. ? Bubbly (carbonated) soft drinks. ? Caffeinated energy drinks and soft drinks.  Limit high-fat foods such as: ? Fatty meat or fried foods. ? Whole milk, cream,  butter, or ice cream. ? Nuts and nut butters. ? Pastries, donuts, and sweets made with butter or shortening.  Avoid foods that cause symptoms. These foods may be different for everyone. Common foods that cause symptoms include: ? Tomatoes. ? Oranges, lemons, and limes. ? Peppers. ? Spicy food. ? Onions and garlic. ? Vinegar. Lifestyle  Maintain a healthy weight. Ask your doctor what weight is healthy for you. If you need to lose weight, work with your doctor to do so safely.  Exercise for at least 30 minutes for 5 or more days each week, or as told by your doctor.  Wear loose-fitting clothes.  Do not smoke. If you need help quitting, ask your doctor.  Sleep with the head of your bed higher than your feet. Use a wedge under the mattress or blocks under the bed frame to raise the head of the bed. Summary  When you have gastroesophageal reflux disease (GERD), food and lifestyle choices are very important in easing your symptoms.  Eat small meals often instead of 3 large meals a day. Eat your meals slowly, and in a place where you are relaxed.  Limit high-fat foods such as fatty meat or fried foods.  Avoid bending over or lying down  until 2-3 hours after eating.  Avoid peppermint and spearmint, caffeine, alcohol, and chocolate. This information is not intended to replace advice given to you by your health care provider. Make sure you discuss any questions you have with your health care provider. Document Revised: 09/01/2018 Document Reviewed: 06/16/2016 Elsevier Patient Education  [place].

## 2019-11-24 LAB — CBC WITH DIFFERENTIAL/PLATELET
Basophils Absolute: 0.1 10*3/uL (ref 0.0–0.2)
Basos: 1 %
EOS (ABSOLUTE): 0.2 10*3/uL (ref 0.0–0.4)
Eos: 4 %
Hematocrit: 35.9 % — ABNORMAL LOW (ref 37.5–51.0)
Hemoglobin: 12.1 g/dL — ABNORMAL LOW (ref 13.0–17.7)
Immature Grans (Abs): 0 10*3/uL (ref 0.0–0.1)
Immature Granulocytes: 1 %
Lymphocytes Absolute: 1.4 10*3/uL (ref 0.7–3.1)
Lymphs: 24 %
MCH: 30.6 pg (ref 26.6–33.0)
MCHC: 33.7 g/dL (ref 31.5–35.7)
MCV: 91 fL (ref 79–97)
Monocytes Absolute: 0.6 10*3/uL (ref 0.1–0.9)
Monocytes: 11 %
Neutrophils Absolute: 3.6 10*3/uL (ref 1.4–7.0)
Neutrophils: 59 %
Platelets: 276 10*3/uL (ref 150–450)
RBC: 3.95 x10E6/uL — ABNORMAL LOW (ref 4.14–5.80)
RDW: 12.8 % (ref 11.6–15.4)
WBC: 6 10*3/uL (ref 3.4–10.8)

## 2019-11-24 LAB — COMPREHENSIVE METABOLIC PANEL
ALT: 23 IU/L (ref 0–44)
AST: 30 IU/L (ref 0–40)
Albumin/Globulin Ratio: 1.9 (ref 1.2–2.2)
Albumin: 4.8 g/dL (ref 3.8–4.8)
Alkaline Phosphatase: 66 IU/L (ref 48–121)
BUN/Creatinine Ratio: 15 (ref 10–24)
BUN: 12 mg/dL (ref 8–27)
Bilirubin Total: 0.4 mg/dL (ref 0.0–1.2)
CO2: 24 mmol/L (ref 20–29)
Calcium: 9.9 mg/dL (ref 8.6–10.2)
Chloride: 99 mmol/L (ref 96–106)
Creatinine, Ser: 0.8 mg/dL (ref 0.76–1.27)
GFR calc Af Amer: 105 mL/min/{1.73_m2} (ref >59)
GFR calc non Af Amer: 91 mL/min/{1.73_m2} (ref >59)
Globulin, Total: 2.5 g/dL (ref 1.5–4.5)
Glucose: 91 mg/dL (ref 65–99)
Potassium: 4.1 mmol/L (ref 3.5–5.2)
Sodium: 137 mmol/L (ref 134–144)
Total Protein: 7.3 g/dL (ref 6.0–8.5)

## 2019-11-24 LAB — AMYLASE: Amylase: 48 U/L (ref 31–110)

## 2019-11-24 LAB — LIPASE: Lipase: 40 U/L (ref 13–78)

## 2019-11-24 NOTE — Progress Notes (Signed)
Labs consistent with previous- RBC stable low, Hemoglobin/ HCT are low and consistent with previous labs.  CMP within normal.  Amylase and lipase normal.  Labs resulting jeep follow up with Dr. Caryn Section return sooner if worsening.

## 2019-12-12 ENCOUNTER — Ambulatory Visit
Admission: RE | Admit: 2019-12-12 | Discharge: 2019-12-12 | Disposition: A | Discharge status: Home / Self Care | Payer: Medicare HMO | Attending: Family Medicine | Admitting: Family Medicine

## 2019-12-12 ENCOUNTER — Encounter: Payer: Self-pay | Admitting: Family Medicine

## 2019-12-12 ENCOUNTER — Ambulatory Visit (INDEPENDENT_AMBULATORY_CARE_PROVIDER_SITE_OTHER): Payer: Medicare HMO | Admitting: Family Medicine

## 2019-12-12 ENCOUNTER — Other Ambulatory Visit: Payer: Self-pay

## 2019-12-12 ENCOUNTER — Ambulatory Visit
Admission: RE | Admit: 2019-12-12 | Discharge: 2019-12-12 | Disposition: A | Discharge status: Home / Self Care | Payer: Medicare HMO | Source: Ambulatory Visit | Attending: Family Medicine | Admitting: Family Medicine

## 2019-12-12 VITALS — BP 132/84 | HR 84 | Temp 97.3°F | Resp 16 | Wt 187.0 lb

## 2019-12-12 DIAGNOSIS — M545 Low back pain: Secondary | ICD-10-CM | POA: Diagnosis not present

## 2019-12-12 DIAGNOSIS — K2900 Acute gastritis without bleeding: Secondary | ICD-10-CM | POA: Diagnosis not present

## 2019-12-12 DIAGNOSIS — M5442 Lumbago with sciatica, left side: Secondary | ICD-10-CM

## 2019-12-12 NOTE — Progress Notes (Signed)
Established patient visit   Patient: Jason Holmes   DOB: [birth year]   69 y.o. Male  MRN: [id number] Visit Date: 12/12/2019  Today's healthcare provider: Lelon Huh, MD   Chief Complaint  Patient presents with  . Back Pain  . Follow-up   Subjective    HPI HPI    Back Pain    This is a new problem.  There was not an injury that may have caused the pain.  Recent episode started more than a month ago.  The problem has been gradually improving since onset.  Pain is lumbar spine and gluteal.  The pain radiates to left thigh and left knee.  The quality of pain is described as aching.  Severity of the pain is moderate.  Pain occurs daily.  Symptoms worse in evening.  The symptoms are aggravated by bending, standing and walking.  Symptoms are relieved by sitting.  Treatments: topical anesthetics, acetaminophen and NSAIDs.  Treatment provided mild relief.  Abdominal Pain: Absent.  Bowel incontinence: Absent.  Chest pain:  Absent.  Dysuria: Absent.  Fever: Absent.  Headaches: Absent.  Joint pains: Absent.  Weakness in leg: Absent.  Pelvic pain: Absent.  Tingling in lower extremities: Absent.  Urinary incontinence: Absent.  Weight loss: Absent.       Last edited by Dorian Pod, CMA on 12/12/2019  3:43 PM. (History)     Advil helps, but stopped taking due to gastritis. Has been just taking it occasionally. Pain isn't as bad in the morning and gets worse as the day goes on.   Follow up for Peptic ulcer:  The patient was last seen for this 3 weeks ago (seen by [employer], PA-C).      Changes made at last visit include starting Omeprazole. Labs were ordered including normal CBC, met C and Lipase. Patient was counseled to decrease and discontinue alcohol consumption. He was advised to switch to Tylenol and avoid NSAID'S for a few weeks.  He reports excellent compliance with treatment. He feels that condition is Improved. He is not having side effects.    -----------------------------------------------------------------------------------------   Patient Active Problem List   Diagnosis Date Noted  . Peptic ulcer 11/23/2019  . Epigastric pain 11/23/2019  . Excessive drinking of alcohol 11/23/2019  . Atypical small acinar proliferation of prostate 07/15/2018  . Right shoulder pain 04/14/2017  . PIN (prostatic intraepithelial neoplasia) 08/11/2016  . Benign localized hyperplasia of prostate with urinary obstruction 08/11/2016  . Elevated PSA 05/13/2016  . Neuralgia neuritis, sciatic nerve 08/08/2015  . Palpitations 08/08/2015  . Absolute anemia 02/26/2009  . Toxic effect of venom 02/07/2009  . Essential (primary) hypertension 07/20/2007  . Combined fat and carbohydrate induced hyperlipemia 05/25/2004  . ED (erectile dysfunction) of organic origin 05/26/2003   Social History   Tobacco Use  . Smoking status: Former Smoker    Types: Cigarettes    Quit date: 01/30/2003    Years since quitting: 16.8  . Smokeless tobacco: Current User    Types: Chew  Vaping Use  . Vaping Use: Never used  Substance Use Topics  . Alcohol use: Yes    Alcohol/week: 70.0 standard drinks    Types: 70 Cans of beer per week    Comment: 8-10 drinks per day  . Drug use: No   Allergies  Allergen Reactions  . Lovastatin        Medications: Outpatient Medications Prior to Visit  Medication Sig  . aspirin 81 MG tablet Take 81  mg by mouth daily.   . diphenhydrAMINE (BENADRYL) 25 MG tablet Take 25 mg by mouth at bedtime as needed.  . Glucosamine Sulfate (GNP GLUCOSAME MAXIMUM STRENGTH) 1000 MG TABS Take by mouth daily.   . hydrochlorothiazide (HYDRODIURIL) 25 MG tablet Take 1 tablet (25 mg total) by mouth daily.  . MULTIPLE VITAMIN PO Take by mouth daily.  . naltrexone (DEPADE) 50 MG tablet Take 1 tablet (50 mg total) by mouth daily.  Marland Kitchen omeprazole (PRILOSEC) 40 MG capsule Take 1 capsule (40 mg total) by mouth daily.  . sildenafil (VIAGRA) 100 MG tablet  Take 1 tablet (100 mg total) by mouth daily as needed for erectile dysfunction.  . simvastatin (ZOCOR) 20 MG tablet Take 1 tablet (20 mg total) by mouth at bedtime.   No facility-administered medications prior to visit.    Review of Systems  Constitutional: Negative for appetite change and fatigue.  Respiratory: Negative for chest tightness and shortness of breath.   Cardiovascular: Negative for chest pain and palpitations.  Musculoskeletal: Positive for back pain and myalgias.      Objective    BP 132/84 (BP Location: Right Arm, Patient Position: Sitting, Cuff Size: Normal)   Pulse 84   Temp (!) 97.3 F (36.3 C) (Temporal)   Resp 16   Wt 187 lb (84.8 kg)   BMI 29.29 kg/m  BP Readings from Last 3 Encounters:  12/12/19 132/84  11/23/19 134/82  07/21/19 127/79   Wt Readings from Last 3 Encounters:  12/12/19 187 lb (84.8 kg)  11/23/19 185 lb 3.2 oz (84 kg)  07/21/19 178 lb (80.7 kg)      Physical Exam  General appearance:  Well developed, well nourished male, cooperative and in no acute distress Head: Normocephalic, without obvious abnormality, atraumatic Respiratory: Respirations even and unlabored, normal respiratory rate MS: minimal tenderness over lower lumbar spine and left para lumbar muscles.   No results found for any visits on 12/12/19.  Assessment & Plan     1. Acute gastritis without hemorrhage, unspecified gastritis type Symptomatically much improved on PPI. He was only consuming occasional NSAID prior to treatment so is not likely to be NSAID gastritis. May be secondary to alcohol. Need to rule out- H. pylori with breath test  2. Acute midline low back pain with left-sided sciatica Persistent x 6 weeks. Currently off of NSAIDs due to gastritis above.  - DG Lumbar Spine Complete; Future           Lelon Huh, MD  [facility] [phone] (phone) [phone] (fax)  [place]

## 2019-12-12 NOTE — Patient Instructions (Signed)
.   Please review the attached list of medications and notify my office if there are any errors.   . Please bring all of your medications to every appointment so we can make sure that our medication list is the same as yours.   Jason Holmes to the [facility] on [facility] for back [place]

## 2019-12-14 LAB — H. PYLORI BREATH TEST: H pylori Breath Test: NEGATIVE

## 2019-12-18 ENCOUNTER — Other Ambulatory Visit: Payer: Self-pay | Admitting: Family Medicine

## 2019-12-18 MED ORDER — OMEPRAZOLE 40 MG PO CPDR: 40.0000 mg | DELAYED_RELEASE_CAPSULE | Freq: Every day | ORAL | 1 refills | Status: DC

## 2019-12-18 NOTE — Telephone Encounter (Signed)
omeprazole (PRILOSEC) 40 MG capsule Medication Date: 11/23/2019 Department: [facility] Ordering/Authorizing: Claudina Lick Kelby Aline, FNP  Order Providers  Prescribing Provider Encounter Provider  Flinchum, Kelby Aline, FNP Flinchum, Kelby Aline, FNP   Pt states tolerated this well and would like refills. Also instead of [facility] send to  [facility] [place] to Registered [place] Sites Phone:  [phone]  Fax:  [phone]

## 2019-12-28 DIAGNOSIS — R69 Illness, unspecified: Secondary | ICD-10-CM | POA: Diagnosis not present

## 2020-01-16 ENCOUNTER — Other Ambulatory Visit: Payer: Self-pay

## 2020-01-16 DIAGNOSIS — R972 Elevated prostate specific antigen [PSA]: Secondary | ICD-10-CM

## 2020-01-17 ENCOUNTER — Other Ambulatory Visit: Payer: Medicare HMO

## 2020-01-17 ENCOUNTER — Other Ambulatory Visit: Payer: Self-pay

## 2020-01-17 DIAGNOSIS — R972 Elevated prostate specific antigen [PSA]: Secondary | ICD-10-CM | POA: Diagnosis not present

## 2020-01-18 ENCOUNTER — Telehealth: Payer: Self-pay | Admitting: *Deleted

## 2020-01-18 ENCOUNTER — Other Ambulatory Visit: Payer: Self-pay

## 2020-01-18 LAB — PSA: Prostate Specific Ag, Serum: 10.9 ng/mL — ABNORMAL HIGH (ref 0.0–4.0)

## 2020-01-18 NOTE — Telephone Encounter (Signed)
Notified patient as instructed, patient pleased. Discussed follow-up appointments, patient agrees  

## 2020-01-18 NOTE — Telephone Encounter (Signed)
-----   Message from [name], MD sent at 01/18/2020  7:47 AM EDT ----- PSA increased to 10.9.  Please schedule office visit for DRE and UA

## 2020-01-24 DIAGNOSIS — R69 Illness, unspecified: Secondary | ICD-10-CM | POA: Diagnosis not present

## 2020-01-30 DIAGNOSIS — R69 Illness, unspecified: Secondary | ICD-10-CM | POA: Diagnosis not present

## 2020-01-31 ENCOUNTER — Other Ambulatory Visit: Payer: Self-pay

## 2020-01-31 ENCOUNTER — Ambulatory Visit: Payer: Medicare HMO | Admitting: Urology

## 2020-01-31 ENCOUNTER — Encounter: Payer: Self-pay | Admitting: Urology

## 2020-01-31 VITALS — BP 157/88 | HR 77 | Ht 67.0 in | Wt 180.0 lb

## 2020-01-31 DIAGNOSIS — R972 Elevated prostate specific antigen [PSA]: Secondary | ICD-10-CM

## 2020-01-31 DIAGNOSIS — N138 Other obstructive and reflux uropathy: Secondary | ICD-10-CM | POA: Diagnosis not present

## 2020-01-31 DIAGNOSIS — N401 Enlarged prostate with lower urinary tract symptoms: Secondary | ICD-10-CM

## 2020-01-31 NOTE — Progress Notes (Signed)
01/31/2020 10:48 AM   [name] [birth year] [id number]  Referring provider: Birdie Sons, MD [address] [place],  [place] [address]  Chief Complaint  Patient presents with  . Benign Prostatic Hypertrophy    Urologic history: 1.Elevated PSA -Prostate biopsy April 2018 for PSA of 6.8/right prostate nodule; prostate volume 44.5 cc;pathology HGPIN and focus ASAP -Prostate MRI 06/2017 consistent with BPH/no suspicious lesions  HPI: 69 y.o. male presents for follow-up of an elevated PSA.   Last seen 06/2019 and elected surveillance for rising PSA  6 months follow-up PSA recommended which has increased to 10.9  No bothersome LUTS  Denies dysuria, gross hematuria  Denies flank, abdominal or pelvic pain     PMH: Past Medical History:  Diagnosis Date  . Elevated PSA   . Erectile dysfunction   . Hyperlipidemia   . Hypertension   . Sciatica    right    Surgical History: Past Surgical History:  Procedure Laterality Date  . COLONOSCOPY N/A 02/24/2017   Procedure: COLONOSCOPY;  Surgeon: Lin Landsman, MD;  Location: [facility];  Service: Endoscopy;  Laterality: N/A;  . NASAL FRACTURE SURGERY  2005  . PROSTATE BIOPSY    . TONSILLECTOMY  1972    Home Medications:  Allergies as of 01/31/2020      Reactions   Lovastatin       Medication List       Accurate as of January 31, 2020 10:48 AM. If you have any questions, ask your nurse or doctor.        aspirin 81 MG tablet Take 81 mg by mouth daily.   diphenhydrAMINE 25 MG tablet Commonly known as: BENADRYL Take 25 mg by mouth at bedtime as needed.   GNP Glucosame Maximum Strength 1000 MG Tabs Generic drug: Glucosamine Sulfate Take by mouth daily.   hydrochlorothiazide 25 MG tablet Commonly known as: HYDRODIURIL Take 1 tablet (25 mg total) by mouth daily.   MULTIPLE VITAMIN PO Take by mouth daily.   naltrexone 50 MG tablet Commonly known as: DEPADE Take 1 tablet  (50 mg total) by mouth daily.   omeprazole 40 MG capsule Commonly known as: PRILOSEC Take 1 capsule (40 mg total) by mouth daily.   sildenafil 100 MG tablet Commonly known as: VIAGRA Take 1 tablet (100 mg total) by mouth daily as needed for erectile dysfunction.   simvastatin 20 MG tablet Commonly known as: ZOCOR Take 1 tablet (20 mg total) by mouth at bedtime.       Allergies:  Allergies  Allergen Reactions  . Lovastatin     Family History: Family History  Problem Relation Age of Onset  . Heart attack Mother   . Heart attack Father   . Prostate cancer Neg Hx   . Kidney cancer Neg Hx     Social History:  reports that he quit smoking about 17 years ago. His smoking use included cigarettes. His smokeless tobacco use includes chew. He reports current alcohol use of about 70.0 standard drinks of alcohol per week. He reports that he does not use drugs.   Physical Exam: BP (!) 157/88   Pulse 77   Ht 5\' 7"  (1.702 m)   Wt 180 lb (81.6 kg)   BMI 28.19 kg/m   Constitutional:  Alert and oriented, No acute distress. HEENT: [place] AT, moist mucus membranes.  Trachea midline, no masses. Cardiovascular: No clubbing, cyanosis, or edema.  RRR Respiratory: Normal respiratory effort, no increased work of breathing,  clear Skin: No rashes, bruises or suspicious lesions. Neurologic: Grossly intact, no focal deficits, moving all 4 extremities. Psychiatric: Normal mood and affect.  Laboratory Data:  Urinalysis Dipstick/microscopy negative   Assessment & Plan:    1.  Elevated PSA  Significant PSA rise with negative urinalysis  No change in voiding symptoms  Negative MRI 2019, we discussed the false-negative rate of prostate MRI  Based on relative recent negative MRI and rising PSA I have recommended scheduling TRUS/biopsy prostate  States he did not tolerate previous biopsy well and would like performed in same-day surgery under sedation/anesthesia  The procedure was  discussed in detail including potential risks of bleeding, infection/sepsis.    [name], [facility] [address] [place], [place] [address] [phone]

## 2020-02-01 LAB — URINALYSIS, COMPLETE
Bilirubin, UA: NEGATIVE
Glucose, UA: NEGATIVE
Ketones, UA: NEGATIVE
Leukocytes,UA: NEGATIVE
Nitrite, UA: NEGATIVE
Protein,UA: NEGATIVE
RBC, UA: NEGATIVE
Specific Gravity, UA: 1.02 (ref 1.005–1.030)
Urobilinogen, Ur: 0.2 mg/dL (ref 0.2–1.0)
pH, UA: 7.5 (ref 5.0–7.5)

## 2020-02-01 LAB — MICROSCOPIC EXAMINATION: Bacteria, UA: NONE SEEN

## 2020-02-13 DIAGNOSIS — R69 Illness, unspecified: Secondary | ICD-10-CM | POA: Diagnosis not present

## 2020-02-15 ENCOUNTER — Other Ambulatory Visit: Payer: Self-pay | Admitting: Radiology

## 2020-02-15 ENCOUNTER — Other Ambulatory Visit: Payer: Self-pay | Admitting: Urology

## 2020-02-15 DIAGNOSIS — R972 Elevated prostate specific antigen [PSA]: Secondary | ICD-10-CM

## 2020-02-16 ENCOUNTER — Other Ambulatory Visit: Payer: Self-pay | Admitting: Radiology

## 2020-02-19 ENCOUNTER — Other Ambulatory Visit: Payer: Self-pay | Admitting: Radiology

## 2020-02-19 DIAGNOSIS — R972 Elevated prostate specific antigen [PSA]: Secondary | ICD-10-CM

## 2020-02-23 ENCOUNTER — Other Ambulatory Visit: Payer: Self-pay

## 2020-02-23 DIAGNOSIS — Z01818 Encounter for other preprocedural examination: Secondary | ICD-10-CM

## 2020-02-26 ENCOUNTER — Other Ambulatory Visit: Payer: Self-pay

## 2020-02-26 ENCOUNTER — Other Ambulatory Visit: Payer: Medicare HMO

## 2020-02-26 DIAGNOSIS — Z01818 Encounter for other preprocedural examination: Secondary | ICD-10-CM | POA: Diagnosis not present

## 2020-02-27 ENCOUNTER — Other Ambulatory Visit: Payer: Self-pay

## 2020-02-27 ENCOUNTER — Encounter
Admission: RE | Admit: 2020-02-27 | Discharge: 2020-02-27 | Disposition: A | Discharge status: Home / Self Care | Payer: Medicare HMO | Source: Ambulatory Visit | Attending: Urology | Admitting: Urology

## 2020-02-27 NOTE — Patient Instructions (Signed)
Your procedure is scheduled on: 03/05/20 Report to [facility]. To find out your arrival time please call [phone] between 1PM - 3PM on 03/04/20.  Remember: Instructions that are not followed completely may result in serious medical risk, up to and including death, or upon the discretion of your surgeon and anesthesiologist your surgery may need to be rescheduled.     _X__ 1. Do not eat food after midnight the night before your procedure.                 No gum chewing or hard candies. You may drink clear liquids up to 2 hours                 before you are scheduled to arrive for your surgery- DO not drink clear                 liquids within 2 hours of the start of your surgery.                 Clear Liquids include:  water, apple juice without pulp, clear carbohydrate                 drink such as Clearfast or Gatorade, Black Coffee or Tea (Do not add                 anything to coffee or tea). Diabetics water only  __X__2.  On the morning of surgery brush your teeth with toothpaste and water, you                 may rinse your mouth with mouthwash if you wish.  Do not swallow any              toothpaste of mouthwash.     _X__ 3.  No Alcohol for 24 hours before or after surgery.   _X__ 4.  Do Not Smoke or use e-cigarettes For 24 Hours Prior to Your Surgery.                 Do not use any chewable tobacco products for at least 6 hours prior to                 surgery.  ____  5.  Bring all medications with you on the day of surgery if instructed.   __X__  6.  Notify your doctor if there is any change in your medical condition      (cold, fever, infections).     Do not wear jewelry, make-up, hairpins, clips or nail polish. Do not wear lotions, powders, or perfumes.  Do not shave 48 hours prior to surgery. Men may shave face and neck. Do not bring valuables to the hospital.    [facility] is not responsible for any belongings  or valuables.  Contacts, dentures/partials or body piercings may not be worn into surgery. Bring a case for your contacts, glasses or hearing aids, a denture cup will be supplied. Leave your suitcase in the car. After surgery it may be brought to your room. For patients admitted to the hospital, discharge time is determined by your treatment team.   Patients discharged the day of surgery will not be allowed to drive home.   Please read over the following fact sheets that you were given:   MRSA Information  __X__ Take these medicines the morning of surgery with A SIP OF WATER:  1. omeprazole (PRILOSEC) 40 MG capsule  2.   3.   4.  5.  6.  ____ Fleet Enema (as directed)   __X__ Use CHG Soap/SAGE wipes as directed  ____ Use inhalers on the day of surgery  ____ Stop metformin/Janumet/Farxiga 2 days prior to surgery    ____ Take 1/2 of usual insulin dose the night before surgery. No insulin the morning          of surgery.   ____ Stop Blood Thinners Coumadin/Plavix/Xarelto/Pleta/Pradaxa/Eliquis/Effient/Aspirin  on   Or contact your Surgeon, Cardiologist or Medical Doctor regarding  ability to stop your blood thinners  __X__ Stop Anti-inflammatories 7 days before surgery such as Advil, Ibuprofen, Motrin,  BC or Goodies Powder, Naprosyn, Naproxen, Aleve, Aspirin    __X__ Stop all herbal supplements, fish oil or vitamin E until after surgery.    ____ Bring C-Pap to the hospital.    STOP GLUCOSAMINE, ASPIRIN AND TURMERIC TODAY 02/27/20

## 2020-02-28 ENCOUNTER — Encounter
Admission: RE | Admit: 2020-02-28 | Discharge: 2020-02-28 | Disposition: A | Discharge status: Home / Self Care | Payer: Medicare HMO | Source: Ambulatory Visit | Attending: Urology | Admitting: Urology

## 2020-02-28 ENCOUNTER — Other Ambulatory Visit: Payer: Self-pay

## 2020-02-28 DIAGNOSIS — Z01812 Encounter for preprocedural laboratory examination: Secondary | ICD-10-CM | POA: Diagnosis not present

## 2020-02-29 ENCOUNTER — Encounter: Payer: Self-pay | Admitting: Urology

## 2020-02-29 LAB — CULTURE, URINE COMPREHENSIVE

## 2020-03-01 ENCOUNTER — Other Ambulatory Visit: Payer: Self-pay

## 2020-03-01 ENCOUNTER — Other Ambulatory Visit
Admission: RE | Admit: 2020-03-01 | Discharge: 2020-03-01 | Disposition: A | Discharge status: Home / Self Care | Payer: Medicare HMO | Source: Ambulatory Visit | Attending: Urology | Admitting: Urology

## 2020-03-01 DIAGNOSIS — Z01818 Encounter for other preprocedural examination: Secondary | ICD-10-CM | POA: Diagnosis not present

## 2020-03-01 DIAGNOSIS — Z20822 Contact with and (suspected) exposure to covid-19: Secondary | ICD-10-CM | POA: Diagnosis not present

## 2020-03-02 LAB — SARS CORONAVIRUS 2 (TAT 6-24 HRS): SARS Coronavirus 2: NEGATIVE

## 2020-03-05 ENCOUNTER — Encounter: Payer: Self-pay | Admitting: Urology

## 2020-03-05 ENCOUNTER — Ambulatory Visit
Admission: RE | Admit: 2020-03-05 | Discharge: 2020-03-05 | Disposition: A | Discharge status: Home / Self Care | Payer: Medicare HMO | Source: Ambulatory Visit | Attending: Urology | Admitting: Urology

## 2020-03-05 ENCOUNTER — Ambulatory Visit: Payer: Medicare HMO | Admitting: Urgent Care

## 2020-03-05 ENCOUNTER — Ambulatory Visit: Payer: Medicare HMO | Admitting: Anesthesiology

## 2020-03-05 ENCOUNTER — Encounter
Admission: RE | Disposition: A | Discharge status: Home / Self Care | Payer: Self-pay | Source: Home / Self Care | Attending: Urology

## 2020-03-05 ENCOUNTER — Ambulatory Visit
Admission: RE | Admit: 2020-03-05 | Discharge: 2020-03-05 | Disposition: A | Discharge status: Home / Self Care | Payer: Medicare HMO | Attending: Urology | Admitting: Urology

## 2020-03-05 ENCOUNTER — Other Ambulatory Visit: Payer: Self-pay

## 2020-03-05 DIAGNOSIS — M5431 Sciatica, right side: Secondary | ICD-10-CM | POA: Diagnosis not present

## 2020-03-05 DIAGNOSIS — K219 Gastro-esophageal reflux disease without esophagitis: Secondary | ICD-10-CM | POA: Insufficient documentation

## 2020-03-05 DIAGNOSIS — Z79899 Other long term (current) drug therapy: Secondary | ICD-10-CM | POA: Insufficient documentation

## 2020-03-05 DIAGNOSIS — K279 Peptic ulcer, site unspecified, unspecified as acute or chronic, without hemorrhage or perforation: Secondary | ICD-10-CM | POA: Insufficient documentation

## 2020-03-05 DIAGNOSIS — Z7689 Persons encountering health services in other specified circumstances: Secondary | ICD-10-CM | POA: Diagnosis not present

## 2020-03-05 DIAGNOSIS — I10 Essential (primary) hypertension: Secondary | ICD-10-CM | POA: Insufficient documentation

## 2020-03-05 DIAGNOSIS — E785 Hyperlipidemia, unspecified: Secondary | ICD-10-CM | POA: Insufficient documentation

## 2020-03-05 DIAGNOSIS — R972 Elevated prostate specific antigen [PSA]: Secondary | ICD-10-CM | POA: Diagnosis not present

## 2020-03-05 DIAGNOSIS — Z888 Allergy status to other drugs, medicaments and biological substances status: Secondary | ICD-10-CM | POA: Diagnosis not present

## 2020-03-05 DIAGNOSIS — E782 Mixed hyperlipidemia: Secondary | ICD-10-CM | POA: Diagnosis not present

## 2020-03-05 DIAGNOSIS — Z87891 Personal history of nicotine dependence: Secondary | ICD-10-CM | POA: Insufficient documentation

## 2020-03-05 DIAGNOSIS — C61 Malignant neoplasm of prostate: Secondary | ICD-10-CM | POA: Diagnosis not present

## 2020-03-05 HISTORY — PX: PROSTATE BIOPSY: SHX241

## 2020-03-05 SURGERY — BIOPSY, PROSTATE, RECTAL APPROACH, WITH US GUIDANCE
Anesthesia: General | Site: Prostate

## 2020-03-05 MED ORDER — OXYCODONE HCL 5 MG PO TABS: 5.0000 mg | ORAL_TABLET | Freq: Once | ORAL | Status: DC | PRN

## 2020-03-05 MED ORDER — FENTANYL CITRATE (PF) 100 MCG/2ML IJ SOLN: 25.0000 ug | INTRAMUSCULAR | Status: DC | PRN

## 2020-03-05 MED ORDER — CHLORHEXIDINE GLUCONATE 0.12 % MT SOLN: 15.0000 mL | Freq: Once | OROMUCOSAL | Status: AC

## 2020-03-05 MED ORDER — SODIUM CHLORIDE 0.9 % IV SOLN: INTRAVENOUS | Status: DC

## 2020-03-05 MED ORDER — OXYCODONE HCL 5 MG/5ML PO SOLN: 5.0000 mg | Freq: Once | ORAL | Status: DC | PRN

## 2020-03-05 MED ORDER — CHLORHEXIDINE GLUCONATE 0.12 % MT SOLN
OROMUCOSAL | Status: AC
  Administered 2020-03-05: 15 mL via OROMUCOSAL
  Filled 2020-03-05: qty 15

## 2020-03-05 MED ORDER — PROPOFOL 500 MG/50ML IV EMUL
INTRAVENOUS | Status: DC | PRN
  Administered 2020-03-05: 125 ug/kg/min via INTRAVENOUS

## 2020-03-05 MED ORDER — PROPOFOL 500 MG/50ML IV EMUL
INTRAVENOUS | Status: AC
  Filled 2020-03-05: qty 50

## 2020-03-05 MED ORDER — FENTANYL CITRATE (PF) 100 MCG/2ML IJ SOLN
INTRAMUSCULAR | Status: DC | PRN
Start: 2020-03-05 — End: 2020-03-05
  Administered 2020-03-05 (×2): 50 ug via INTRAVENOUS

## 2020-03-05 MED ORDER — DEXAMETHASONE SODIUM PHOSPHATE 4 MG/ML IJ SOLN
INTRAMUSCULAR | Status: DC | PRN
  Administered 2020-03-05: 4 mg via INTRAVENOUS

## 2020-03-05 MED ORDER — FENTANYL CITRATE (PF) 100 MCG/2ML IJ SOLN
INTRAMUSCULAR | Status: AC
  Filled 2020-03-05: qty 2

## 2020-03-05 MED ORDER — LIDOCAINE HCL (CARDIAC) PF 100 MG/5ML IV SOSY
PREFILLED_SYRINGE | INTRAVENOUS | Status: DC | PRN
  Administered 2020-03-05: 50 mg via INTRAVENOUS

## 2020-03-05 MED ORDER — ORAL CARE MOUTH RINSE: 15.0000 mL | Freq: Once | OROMUCOSAL | Status: AC

## 2020-03-05 MED ORDER — ONDANSETRON HCL 4 MG/2ML IJ SOLN
INTRAMUSCULAR | Status: DC | PRN
  Administered 2020-03-05: 4 mg via INTRAVENOUS

## 2020-03-05 MED ORDER — SODIUM CHLORIDE 0.9 % IV SOLN
1.0000 g | INTRAVENOUS | Status: AC
  Administered 2020-03-05: 1 g via INTRAVENOUS
  Filled 2020-03-05: qty 1

## 2020-03-05 SURGICAL SUPPLY — 10 items
COVER MAYO STAND REUSABLE (DRAPES) ×2 IMPLANT
DRSG TELFA 3X8 NADH (GAUZE/BANDAGES/DRESSINGS) ×2 IMPLANT
GAUZE SPONGE 4X4 12PLY STRL (GAUZE/BANDAGES/DRESSINGS) ×2 IMPLANT
GLOVE BIOGEL PI IND STRL 7.5 (GLOVE) ×1 IMPLANT
GLOVE BIOGEL PI INDICATOR 7.5 (GLOVE) ×1
GUIDE NEEDLE ENDOCAV 16-18 CVR (NEEDLE) IMPLANT
INST BIOPSY MAXCORE 18GX25 (NEEDLE) ×2 IMPLANT
KIT TURNOVER CYSTO (KITS) ×2 IMPLANT
NEEDLE GUIDE BIOPSY 644068 (NEEDLE) ×2 IMPLANT
SURGILUBE 2OZ TUBE FLIPTOP (MISCELLANEOUS) ×2 IMPLANT

## 2020-03-05 NOTE — Op Note (Signed)
Preoperative diagnosis:  1. Elevated PSA  Postoperative diagnosis:  1. Elevated PSA  Procedure: 1. Transrectal ultrasound prostate 2. Transrectal prostate biopsies  Surgeon: [name], MD  Anesthesia: General  Complications: None  Intraoperative findings:  Prostate volume calculated at 38 cc  EBL: Minimal  Specimens: Needle biopsies prostate  Indication: Jason Holmes is a 69 y.o. male with a history of an elevated PSA status post biopsy 2018.  PSA has slowly risen over the past year and was 10.9 and August 2021.  A prostate MRI in 2019 showed no suspicious lesions.  After reviewing the management options for treatment, he elected to proceed with the above surgical procedure(s). We have discussed the potential benefits and risks of the procedure, side effects of the proposed treatment, the likelihood of the patient achieving the goals of the procedure, and any potential problems that might occur during the procedure or recuperation. Informed consent has been obtained.  Description of procedure:  The patient was taken to the operating room and deep sedation was obtained by anesthesia.  He was kept on the stretcher and placed in the left lateral decubitus position.  A timeout was performed and preprocedure IV antibiotics were administered.  A transrectal sound probe was lubricated and gently inserted per rectum.  The prostate was imaged and volume measurements were obtained.  There was moderate enlargement of the transition zone and no echogenic abnormalities of the peripheral zone noted.  Standard 12 core biopsies were then performed under ultrasound guidance.  At the completion procedure the ultrasound probe was removed.  No significant bleeding was noted.  Plan: He will be contacted with the pathology results.  [name], M.D.

## 2020-03-05 NOTE — Transfer of Care (Signed)
Immediate Anesthesia Transfer of Care Note  Patient: Jason Holmes  Procedure(s) Performed: BIOPSY TRANSRECTAL ULTRASONIC PROSTATE (TUBP) (N/A Prostate)  Patient Location: PACU  Anesthesia Type:General  Level of Consciousness: awake, alert  and oriented  Airway & Oxygen Therapy: Patient Spontanous Breathing and Patient connected to face mask oxygen  Post-op Assessment: Report given to RN and Post -op Vital signs reviewed and stable  Post vital signs: Reviewed and stable  Last Vitals:  Vitals Value Taken Time  BP 143/89 03/05/20 0913  Temp    Pulse 68 03/05/20 0914  Resp 21 03/05/20 0914  SpO2 99 % 03/05/20 0914  Vitals shown include unvalidated device data.  Last Pain:  Vitals:   03/05/20 0711  TempSrc: Oral  PainSc: 0-No pain         Complications: No complications documented.

## 2020-03-05 NOTE — Anesthesia Preprocedure Evaluation (Signed)
Anesthesia Evaluation  Patient identified by MRN, date of birth, ID band Patient awake    Reviewed: Allergy & Precautions, H&P , NPO status , Patient's Chart, lab work & pertinent test results  History of Anesthesia Complications Negative for: history of anesthetic complications  Airway Mallampati: III  TM Distance: <3 FB Neck ROM: limited    Dental  (+) Chipped   Pulmonary neg shortness of breath, former smoker,    Pulmonary exam normal        Cardiovascular Exercise Tolerance: Good hypertension, Normal cardiovascular exam     Neuro/Psych  Neuromuscular disease negative psych ROS   GI/Hepatic Neg liver ROS, PUD, GERD  Medicated and Controlled,  Endo/Other  negative endocrine ROS  Renal/GU negative Renal ROS  negative genitourinary   Musculoskeletal   Abdominal   Peds  Hematology negative hematology ROS (+)   Anesthesia Other Findings Past Medical History: No date: Elevated PSA No date: Erectile dysfunction No date: Hyperlipidemia No date: Hypertension No date: Sciatica     Comment:  right  Past Surgical History: 02/24/2017: COLONOSCOPY; N/A     Comment:  Procedure: COLONOSCOPY;  Surgeon: Lin Landsman,               MD;  Location: [facility];  Service: Endoscopy;               Laterality: N/A; 2005: NASAL FRACTURE SURGERY No date: PROSTATE BIOPSY 1972: TONSILLECTOMY     Reproductive/Obstetrics negative OB ROS                             Anesthesia Physical Anesthesia Plan  ASA: III  Anesthesia Plan: General   Post-op Pain Management:    Induction: Intravenous  PONV Risk Score and Plan: Propofol infusion and TIVA  Airway Management Planned: Natural Airway and Nasal Cannula  Additional Equipment:   Intra-op Plan:   Post-operative Plan:   Informed Consent: I have reviewed the patients History and Physical, chart, labs and discussed the procedure  including the risks, benefits and alternatives for the proposed anesthesia with the patient or authorized representative who has indicated his/her understanding and acceptance.     Dental Advisory Given  Plan Discussed with: Anesthesiologist, CRNA and Surgeon  Anesthesia Plan Comments: (Patient consented for risks of anesthesia including but not limited to:  - adverse reactions to medications - risk of intubation if required - damage to eyes, teeth, lips or other oral mucosa - nerve damage due to positioning  - sore throat or hoarseness - Damage to heart, brain, nerves, lungs, other parts of body or loss of life  Patient voiced understanding.)        Anesthesia Quick Evaluation

## 2020-03-05 NOTE — Discharge Instructions (Addendum)
Prostate Biopsy Instructions   Do not drive or operate machinery for the next 24 hours  You may resume regular activities in 24 hours  Passing blood from the rectum is normal for the first 24 hours then you may see blood in the urine intermittently for up to 2 weeks  Blood in the semen is normal for several weeks  Please contact our office for excessive rectal or urinary bleeding, urinary difficulty or fever >101 degrees  We will be contacted with the pathology results  [place]   1) The drugs that you were given will stay in your system until tomorrow so for the next 24 hours you should not:  A) Drive an automobile B) Make any legal decisions C) Drink any alcoholic beverage   2) You may resume regular meals tomorrow.  Today it is better to start with liquids and gradually work up to solid foods.  You may eat anything you prefer, but it is better to start with liquids, then soup and crackers, and gradually work up to solid foods.   3) Please notify your doctor immediately if you have any unusual bleeding, trouble breathing, redness and pain at the surgery site, drainage, fever, or pain not relieved by medication.    4) Additional Instructions:        Please contact your physician with any problems or Same Day Surgery at [phone], Monday through Friday 6 am to 4 pm, or [place] at [facility] number at [phone].

## 2020-03-05 NOTE — Progress Notes (Signed)
   03/05/20 1358  Clinical Encounter Type  Visited With Family  Visit Type Initial  Referral From Chaplain  Consult/Referral To Chaplain  While rounding SDS waiting area, chaplain spoke with Pt's wife. It was a pleasant conversation. She did not have in questions or concerns, but told chaplain she is going to the beach tomorrow.

## 2020-03-05 NOTE — Anesthesia Postprocedure Evaluation (Signed)
Anesthesia Post Note  Patient: Jason Holmes  Procedure(s) Performed: BIOPSY TRANSRECTAL ULTRASONIC PROSTATE (TUBP) (N/A Prostate)  Patient location during evaluation: PACU Anesthesia Type: General Level of consciousness: awake and alert Pain management: pain level controlled Vital Signs Assessment: post-procedure vital signs reviewed and stable Respiratory status: spontaneous breathing, nonlabored ventilation, respiratory function stable and patient connected to nasal cannula oxygen Cardiovascular status: blood pressure returned to baseline and stable Postop Assessment: no apparent nausea or vomiting Anesthetic complications: no   No complications documented.   Last Vitals:  Vitals:   03/05/20 0929 03/05/20 0950  BP: 127/68   Pulse: 63 (!) 55  Resp: 13 14  Temp:  (!) 36.2 C  SpO2: 99% 100%    Last Pain:  Vitals:   03/05/20 0950  TempSrc: Temporal  PainSc: 0-No pain                 Precious Haws Jayd Cadieux

## 2020-03-05 NOTE — H&P (Signed)
03/05/2020 8:35 AM   [name] 1950-05-30 [id number]  Referring provider: Birdie Sons, MD [address] [place],  [place] [address]     Chief Complaint  Patient presents with  . Benign Prostatic Hypertrophy    Urologic history: 1.Elevated PSA -Prostate biopsy April 2018 for PSA of 6.8/right prostate nodule; prostate volume 44.5 cc;pathology HGPIN and focus ASAP -Prostate MRI 06/2017 consistent with BPH/no suspicious lesions  HPI: 69 y.o. male presents for follow-up of an elevated PSA.   Last seen 06/2019 and elected surveillance for rising PSA  6 months follow-up PSA recommended which has increased to 10.9  No bothersome LUTS  Denies dysuria, gross hematuria  Denies flank, abdominal or pelvic pain     PMH:     Past Medical History:  Diagnosis Date  . Elevated PSA   . Erectile dysfunction   . Hyperlipidemia   . Hypertension   . Sciatica    right    Surgical History:      Past Surgical History:  Procedure Laterality Date  . COLONOSCOPY N/A 02/24/2017   Procedure: COLONOSCOPY;  Surgeon: Lin Landsman, MD;  Location: [facility];  Service: Endoscopy;  Laterality: N/A;  . NASAL FRACTURE SURGERY  2005  . PROSTATE BIOPSY    . TONSILLECTOMY  1972    Home Medications:       Allergies as of 01/31/2020      Reactions   Lovastatin          Medication List       Accurate as of January 31, 2020 10:48 AM. If you have any questions, ask your nurse or doctor.        aspirin 81 MG tablet Take 81 mg by mouth daily.   diphenhydrAMINE 25 MG tablet Commonly known as: BENADRYL Take 25 mg by mouth at bedtime as needed.   GNP Glucosame Maximum Strength 1000 MG Tabs Generic drug: Glucosamine Sulfate Take by mouth daily.   hydrochlorothiazide 25 MG tablet Commonly known as: HYDRODIURIL Take 1 tablet (25 mg total) by mouth daily.   MULTIPLE VITAMIN PO Take by mouth daily.     naltrexone 50 MG tablet Commonly known as: DEPADE Take 1 tablet (50 mg total) by mouth daily.   omeprazole 40 MG capsule Commonly known as: PRILOSEC Take 1 capsule (40 mg total) by mouth daily.   sildenafil 100 MG tablet Commonly known as: VIAGRA Take 1 tablet (100 mg total) by mouth daily as needed for erectile dysfunction.   simvastatin 20 MG tablet Commonly known as: ZOCOR Take 1 tablet (20 mg total) by mouth at bedtime.       Allergies:      Allergies  Allergen Reactions  . Lovastatin     Family History:      Family History  Problem Relation Age of Onset  . Heart attack Mother   . Heart attack Father   . Prostate cancer Neg Hx   . Kidney cancer Neg Hx     Social History:  reports that he quit smoking about 17 years ago. His smoking use included cigarettes. His smokeless tobacco use includes chew. He reports current alcohol use of about 70.0 standard drinks of alcohol per week. He reports that he does not use drugs.   Physical Exam: BP (!) 157/88   Pulse 77   Ht 5\' 7"  (1.702 m)   Wt 180 lb (81.6 kg)   BMI 28.19 kg/m   Constitutional:  Alert and oriented, No  acute distress. HEENT: [place] AT, moist mucus membranes.  Trachea midline, no masses. Cardiovascular: No clubbing, cyanosis, or edema.  RRR Respiratory: Normal respiratory effort, no increased work of breathing, clear Skin: No rashes, bruises or suspicious lesions. Neurologic: Grossly intact, no focal deficits, moving all 4 extremities. Psychiatric: Normal mood and affect.  Laboratory Data:  Urinalysis Dipstick/microscopy negative   Assessment & Plan:    1.  Elevated PSA  Significant PSA rise with negative urinalysis  No change in voiding symptoms  Negative MRI 2019, we discussed the false-negative rate of prostate MRI  Based on relative recent negative MRI and rising PSA I have recommended scheduling TRUS/biopsy prostate  States he did not tolerate previous biopsy  well and would like performed in same-day surgery under sedation/anesthesia  The procedure was discussed in detail including potential risks of bleeding, infection/sepsis.    [name], [facility] [address] [place], [place] [address] [phone]

## 2020-03-06 ENCOUNTER — Encounter: Payer: Self-pay | Admitting: Urology

## 2020-03-08 LAB — SURGICAL PATHOLOGY

## 2020-03-10 ENCOUNTER — Other Ambulatory Visit: Payer: Self-pay | Admitting: Family Medicine

## 2020-03-10 NOTE — Telephone Encounter (Signed)
Requested Prescriptions  Pending Prescriptions Disp Refills  . omeprazole (PRILOSEC) 40 MG capsule [Pharmacy Med Name: OMEPRAZOL RX CAP 40MG ] 90 capsule 1    Sig: TAKE 1 CAPSULE DAILY     Gastroenterology: Proton Pump Inhibitors Passed - 03/10/2020  1:00 PM      Passed - Valid encounter within last 12 months    Recent Outpatient Visits          2 months ago Acute gastritis without hemorrhage, unspecified gastritis type   [facility] Birdie Sons, MD   3 months ago Peptic ulcer   [facility], Kelby Aline, FNP   10 months ago Annual physical exam   [facility] Birdie Sons, MD   2 years ago Annual physical exam   [facility] Birdie Sons, MD   3 years ago Annual physical exam   [facility] Birdie Sons, MD      Future Appointments            In 4 months [place], Ronda Fairly, [facility]

## 2020-03-14 ENCOUNTER — Telehealth: Payer: Self-pay

## 2020-03-14 DIAGNOSIS — L718 Other rosacea: Secondary | ICD-10-CM | POA: Diagnosis not present

## 2020-03-14 DIAGNOSIS — H0288A Meibomian gland dysfunction right eye, upper and lower eyelids: Secondary | ICD-10-CM | POA: Diagnosis not present

## 2020-03-14 DIAGNOSIS — H40013 Open angle with borderline findings, low risk, bilateral: Secondary | ICD-10-CM | POA: Diagnosis not present

## 2020-03-14 DIAGNOSIS — H0102A Squamous blepharitis right eye, upper and lower eyelids: Secondary | ICD-10-CM | POA: Diagnosis not present

## 2020-03-14 DIAGNOSIS — H0102B Squamous blepharitis left eye, upper and lower eyelids: Secondary | ICD-10-CM | POA: Diagnosis not present

## 2020-03-14 DIAGNOSIS — H52223 Regular astigmatism, bilateral: Secondary | ICD-10-CM | POA: Diagnosis not present

## 2020-03-14 DIAGNOSIS — H524 Presbyopia: Secondary | ICD-10-CM | POA: Diagnosis not present

## 2020-03-14 DIAGNOSIS — H5203 Hypermetropia, bilateral: Secondary | ICD-10-CM | POA: Diagnosis not present

## 2020-03-14 DIAGNOSIS — H0288B Meibomian gland dysfunction left eye, upper and lower eyelids: Secondary | ICD-10-CM | POA: Diagnosis not present

## 2020-03-14 DIAGNOSIS — H25013 Cortical age-related cataract, bilateral: Secondary | ICD-10-CM | POA: Diagnosis not present

## 2020-03-14 NOTE — Telephone Encounter (Signed)
Patient and wife left a vmail requesting pathology results. Please advise?

## 2020-03-15 NOTE — Telephone Encounter (Signed)
I spoke with patient's wife who is on his DPR. They were in the car traveling and he was present.  He did have 3/12 cores positive for Gleason 3+3 adenocarcinoma all involving <50% of the submitted tissue.  We discussed risk stratification of low risk prostate cancer.  Active surveillance was discussed which I feel would be an excellent choice. We also discussed EBRT and radical prostatectomy. He is leaning towards active surveillance and will tentatively schedule a 65-month follow-up with PSA/DRE. We discussed the recommendation of prostate MRI and confirmatory biopsy around 1 year after his original biopsy.

## 2020-03-27 DIAGNOSIS — R69 Illness, unspecified: Secondary | ICD-10-CM | POA: Diagnosis not present

## 2020-03-31 DIAGNOSIS — Z0100 Encounter for examination of eyes and vision without abnormal findings: Secondary | ICD-10-CM | POA: Diagnosis not present

## 2020-04-03 DIAGNOSIS — Z0100 Encounter for examination of eyes and vision without abnormal findings: Secondary | ICD-10-CM | POA: Diagnosis not present

## 2020-04-23 ENCOUNTER — Other Ambulatory Visit: Payer: Self-pay | Admitting: Family Medicine

## 2020-04-23 DIAGNOSIS — E782 Mixed hyperlipidemia: Secondary | ICD-10-CM

## 2020-04-23 DIAGNOSIS — I10 Essential (primary) hypertension: Secondary | ICD-10-CM

## 2020-05-22 ENCOUNTER — Telehealth: Payer: Self-pay | Admitting: Family Medicine

## 2020-05-22 NOTE — Telephone Encounter (Signed)
Copied from CRM [id number]. Topic: Medicare AWV >> May 22, 2020  2:03 PM Claudette Laws R wrote: Reason for CRM:   Left message for patient to call back and schedule Medicare Annual Wellness Visit (AWV) in office.   If not able to come in office, please offer to do virtually.   Last AWV12/12/2018  Please schedule at anytime with [facility] Health Advisor.  If any questions, please contact me at [phone]

## 2020-06-16 ENCOUNTER — Other Ambulatory Visit: Payer: Self-pay | Admitting: Family Medicine

## 2020-06-16 DIAGNOSIS — E782 Mixed hyperlipidemia: Secondary | ICD-10-CM

## 2020-06-19 ENCOUNTER — Other Ambulatory Visit: Payer: Self-pay | Admitting: Family Medicine

## 2020-06-19 MED ORDER — SILDENAFIL CITRATE 100 MG PO TABS: 100.0000 mg | ORAL_TABLET | Freq: Every day | ORAL | 3 refills | Status: DC | PRN

## 2020-06-19 NOTE — Progress Notes (Signed)
Fax refill to [facility]

## 2020-07-02 ENCOUNTER — Telehealth: Payer: Self-pay | Admitting: Family Medicine

## 2020-07-02 NOTE — Telephone Encounter (Signed)
Patient declined the Medicare Wellness Visit with [facility] - patient doesn't feel he needs to complete this year

## 2020-07-15 ENCOUNTER — Other Ambulatory Visit: Payer: Self-pay

## 2020-07-17 DIAGNOSIS — D2271 Melanocytic nevi of right lower limb, including hip: Secondary | ICD-10-CM | POA: Diagnosis not present

## 2020-07-17 DIAGNOSIS — L570 Actinic keratosis: Secondary | ICD-10-CM | POA: Diagnosis not present

## 2020-07-17 DIAGNOSIS — Z85828 Personal history of other malignant neoplasm of skin: Secondary | ICD-10-CM | POA: Diagnosis not present

## 2020-07-17 DIAGNOSIS — D2261 Melanocytic nevi of right upper limb, including shoulder: Secondary | ICD-10-CM | POA: Diagnosis not present

## 2020-07-17 DIAGNOSIS — D2262 Melanocytic nevi of left upper limb, including shoulder: Secondary | ICD-10-CM | POA: Diagnosis not present

## 2020-07-17 DIAGNOSIS — D225 Melanocytic nevi of trunk: Secondary | ICD-10-CM | POA: Diagnosis not present

## 2020-07-17 DIAGNOSIS — X32XXXA Exposure to sunlight, initial encounter: Secondary | ICD-10-CM | POA: Diagnosis not present

## 2020-07-17 DIAGNOSIS — L821 Other seborrheic keratosis: Secondary | ICD-10-CM | POA: Diagnosis not present

## 2020-07-22 ENCOUNTER — Ambulatory Visit: Payer: Self-pay | Admitting: Urology

## 2020-08-26 ENCOUNTER — Encounter: Payer: Self-pay | Admitting: Family Medicine

## 2020-08-26 ENCOUNTER — Telehealth: Payer: Self-pay | Admitting: Family Medicine

## 2020-08-26 ENCOUNTER — Telehealth: Payer: Self-pay

## 2020-08-26 DIAGNOSIS — I10 Essential (primary) hypertension: Secondary | ICD-10-CM

## 2020-08-26 MED ORDER — HYDROCHLOROTHIAZIDE 25 MG PO TABS: 25.0000 mg | ORAL_TABLET | Freq: Every day | ORAL | 3 refills | Status: DC

## 2020-08-26 NOTE — Telephone Encounter (Signed)
Medication Refill: hydrochlorothiazide (HYDRODIURIL) 25 MG tablet [[id number]]   Patient called and stated that he has been taking his wife RX and now she has run out. They take the exact same thing so he did not realize the issue. Now he is needing an extra refill because he has used up his wife's medication. He did not get the refill for Jan Feb and March. Please advise   Patient would like this sent to his local pharmacy.    Pharmacy:  [id number] [address], [place] Phone:  [phone]  Fax:  [phone]      Please Advise

## 2020-08-26 NOTE — Telephone Encounter (Signed)
I called pt and scheduled OV. I sent in courtesy refill.

## 2020-09-09 ENCOUNTER — Other Ambulatory Visit: Payer: Self-pay

## 2020-09-09 DIAGNOSIS — R972 Elevated prostate specific antigen [PSA]: Secondary | ICD-10-CM

## 2020-09-10 ENCOUNTER — Other Ambulatory Visit: Payer: Self-pay | Admitting: Family Medicine

## 2020-09-10 ENCOUNTER — Ambulatory Visit (INDEPENDENT_AMBULATORY_CARE_PROVIDER_SITE_OTHER): Payer: Medicare HMO | Admitting: Family Medicine

## 2020-09-10 ENCOUNTER — Other Ambulatory Visit: Payer: Medicare HMO

## 2020-09-10 ENCOUNTER — Other Ambulatory Visit: Payer: Self-pay

## 2020-09-10 VITALS — BP 130/80 | HR 72 | Ht 67.0 in | Wt 191.8 lb

## 2020-09-10 DIAGNOSIS — E782 Mixed hyperlipidemia: Secondary | ICD-10-CM | POA: Diagnosis not present

## 2020-09-10 DIAGNOSIS — R972 Elevated prostate specific antigen [PSA]: Secondary | ICD-10-CM

## 2020-09-10 DIAGNOSIS — I10 Essential (primary) hypertension: Secondary | ICD-10-CM

## 2020-09-10 NOTE — Progress Notes (Signed)
Established patient visit   Patient: Jason Holmes   DOB: [birth year]   70 y.o. Male  MRN: [id number] Visit Date: 09/10/2020  Today's healthcare provider: Lelon Huh, MD   Chief Complaint  Patient presents with  . Hypertension   Subjective    HPI  Hypertension, follow-up  BP Readings from Last 3 Encounters:  09/10/20 (!) 141/74  03/05/20 (!) 150/70  01/31/20 (!) 157/88   Wt Readings from Last 3 Encounters:  09/10/20 191 lb 12.8 oz (87 kg)  02/27/20 180 lb (81.6 kg)  01/31/20 180 lb (81.6 kg)     He was last seen for hypertension 16 months ago.  BP at that visit was 128/74. Management since that visit includes hydrochlorothiazide (HYDRODIURIL) 25 MG tablet; Take 1 tablet (25 mg total) by mouth daily.  He reports excellent compliance with treatment. He is not having side effects.  He is following a Regular diet. - less sodium in diet He is exercising. - waling 30 min a day He does not smoke. - chews tobacco  Use of agents associated with hypertension: NSAIDS.   Outside blood pressures are 120s/70s.    Pertinent labs: Lab Results  Component Value Date   CHOL 217 (H) 05/05/2019   HDL 84 05/05/2019   LDLCALC 116 (H) 05/05/2019   TRIG 97 05/05/2019   CHOLHDL 2.6 05/05/2019   Lab Results  Component Value Date   NA 137 11/23/2019   K 4.1 11/23/2019   CREATININE 0.80 11/23/2019   GFRNONAA 91 11/23/2019   GFRAA 105 11/23/2019   GLUCOSE 91 11/23/2019     The 10-year ASCVD risk score Mikey Bussing DC Jr., et al., 2013) is: 21.9%   ---------------------------------------------------------------------------------------------------      Medications: Outpatient Medications Prior to Visit  Medication Sig  . aspirin 81 MG tablet Take 81 mg by mouth at bedtime.   . Camphor-Menthol-Methyl Sal (SALONPAS) 3.05-30-08 % PTCH Apply 1 patch topically daily as needed (Back pain).  Marland Kitchen diphenhydrAMINE (BENADRYL) 25 MG tablet Take 25 mg by mouth at bedtime.   . Glucosamine  Sulfate (GNP GLUCOSAME MAXIMUM STRENGTH) 1000 MG TABS Take 1,500 mg by mouth at bedtime.   . hydrochlorothiazide (HYDRODIURIL) 25 MG tablet Take 1 tablet (25 mg total) by mouth daily.  . Multiple Vitamin tablet Take 1 tablet by mouth daily. Silver  . omeprazole (PRILOSEC) 40 MG capsule TAKE 1 CAPSULE DAILY  . OVER THE COUNTER MEDICATION Apply 1 application topically daily as needed (pain). leg and back pain relief  CBD cream  . sildenafil (VIAGRA) 100 MG tablet Take 1 tablet (100 mg total) by mouth daily as needed for erectile dysfunction.  . simvastatin (ZOCOR) 20 MG tablet TAKE 1 TABLET AT BEDTIME  . Turmeric 500 MG CAPS Take 500 mg by mouth daily.   No facility-administered medications prior to visit.       Objective    BP 130/80   Pulse 72   Ht 5\' 7"  (1.702 m)   Wt 191 lb 12.8 oz (87 kg)   SpO2 100%   BMI 30.04 kg/m     Physical Exam   General: Appearance:    Mildly obese male in no acute distress  Eyes:    PERRL, conjunctiva/corneas clear, EOM's intact       Lungs:     Clear to auscultation bilaterally, respirations unlabored  Heart:    Normal heart rate. Normal rhythm. No murmurs, rubs, or gallops.   MS:   All extremities are intact.  Neurologic:   Awake, alert, oriented x 3. No apparent focal neurological           defect.         Assessment & Plan     1. Essential (primary) hypertension Well controlled.  Continue current medications.   - TSH  2. Combined fat and carbohydrate induced hyperlipemia He is tolerating simvastatin well with no adverse effects.   - Comprehensive metabolic panel - Lipid panel         The entirety of the information documented in the History of Present Illness, Review of Systems and Physical Exam were personally obtained by me. Portions of this information were initially documented by the CMA and reviewed by me for thoroughness and accuracy.      Lelon Huh, MD  [facility] [phone] (phone) [phone]  (fax)  [place]

## 2020-09-10 NOTE — Patient Instructions (Signed)
.   Please go to the lab draw station in [address] on the second floor of [facility]  when you are fasting for 8 hours. Normal hours are 8:00am to 11:30am and 1:00pm to 4:00pm Monday through Friday

## 2020-09-11 LAB — PSA: Prostate Specific Ag, Serum: 11 ng/mL — ABNORMAL HIGH (ref 0.0–4.0)

## 2020-09-12 DIAGNOSIS — E782 Mixed hyperlipidemia: Secondary | ICD-10-CM | POA: Diagnosis not present

## 2020-09-12 DIAGNOSIS — I10 Essential (primary) hypertension: Secondary | ICD-10-CM | POA: Diagnosis not present

## 2020-09-13 ENCOUNTER — Ambulatory Visit (INDEPENDENT_AMBULATORY_CARE_PROVIDER_SITE_OTHER): Payer: Medicare HMO | Admitting: Urology

## 2020-09-13 ENCOUNTER — Other Ambulatory Visit: Payer: Self-pay

## 2020-09-13 ENCOUNTER — Encounter: Payer: Self-pay | Admitting: Urology

## 2020-09-13 VITALS — BP 132/78 | HR 78 | Ht 67.0 in | Wt 182.0 lb

## 2020-09-13 DIAGNOSIS — C61 Malignant neoplasm of prostate: Secondary | ICD-10-CM | POA: Diagnosis not present

## 2020-09-13 LAB — COMPREHENSIVE METABOLIC PANEL
ALT: 21 IU/L (ref 0–44)
AST: 29 IU/L (ref 0–40)
Albumin/Globulin Ratio: 1.9 (ref 1.2–2.2)
Albumin: 5 g/dL — ABNORMAL HIGH (ref 3.7–4.7)
Alkaline Phosphatase: 64 IU/L (ref 44–121)
BUN/Creatinine Ratio: 12 (ref 10–24)
BUN: 11 mg/dL (ref 8–27)
Bilirubin Total: 0.5 mg/dL (ref 0.0–1.2)
CO2: 24 mmol/L (ref 20–29)
Calcium: 10.8 mg/dL — ABNORMAL HIGH (ref 8.6–10.2)
Chloride: 97 mmol/L (ref 96–106)
Creatinine, Ser: 0.94 mg/dL (ref 0.76–1.27)
Globulin, Total: 2.6 g/dL (ref 1.5–4.5)
Glucose: 89 mg/dL (ref 65–99)
Potassium: 4.6 mmol/L (ref 3.5–5.2)
Sodium: 140 mmol/L (ref 134–144)
Total Protein: 7.6 g/dL (ref 6.0–8.5)
eGFR: 87 mL/min/{1.73_m2} (ref >59)

## 2020-09-13 LAB — LIPID PANEL
Chol/HDL Ratio: 2.5 ratio (ref 0.0–5.0)
Cholesterol, Total: 219 mg/dL — ABNORMAL HIGH (ref 100–199)
HDL: 86 mg/dL (ref >39)
LDL Chol Calc (NIH): 116 mg/dL — ABNORMAL HIGH (ref 0–99)
Triglycerides: 99 mg/dL (ref 0–149)
VLDL Cholesterol Cal: 17 mg/dL (ref 5–40)

## 2020-09-13 LAB — TSH: TSH: 4.52 u[IU]/mL — ABNORMAL HIGH (ref 0.450–4.500)

## 2020-09-13 NOTE — Progress Notes (Signed)
09/13/2020 12:23 PM   Jason Holmes 1950-09-15 [id number]  Referring provider: Birdie Sons, MD [address] [place],  [place] [address]  Chief Complaint  Patient presents with  . Elevated PSA    Urologic history: 1.T1c low risk prostate cancer -Prostate biopsy April 2018 for PSA of 6.8/right prostate nodule; prostate volume 44.5 cc;pathology HGPIN and focus ASAP -Prostate MRI 06/2017 consistent with BPH/no suspicious lesions -Repeat biopsy 03/05/2020 for PSA 10.9; 38 cc volume; 3/12 cores positive Gleason 3+3 adenocarcinoma all involving <50% of submitted tissue -Active surveillance elected   HPI: 70 y.o. male presents for semiannual follow-up of low risk prostate cancer.   No problems since his last visit  No bothersome LUTS  Denies dysuria, gross hematuria  Denies flank, abdominal or pelvic pain  PSA 09/10/2020 stable at 11.0     PMH: Past Medical History:  Diagnosis Date  . Elevated PSA   . Erectile dysfunction   . Hyperlipidemia   . Hypertension   . Sciatica    right    Surgical History: Past Surgical History:  Procedure Laterality Date  . COLONOSCOPY N/A 02/24/2017   Procedure: COLONOSCOPY;  Surgeon: Lin Landsman, MD;  Location: [place];  Service: Endoscopy;  Laterality: N/A;  . NASAL FRACTURE SURGERY  2005  . PROSTATE BIOPSY    . PROSTATE BIOPSY N/A 03/05/2020   Procedure: BIOPSY TRANSRECTAL ULTRASONIC PROSTATE (TUBP);  Surgeon: [name], MD;  Location: [facility];  Service: Urology;  Laterality: N/A;  . TONSILLECTOMY  1972    Home Medications:  Allergies as of 09/13/2020      Reactions   Lovastatin Other (See Comments)   I       Medication List       Accurate as of September 13, 2020 12:23 PM. If you have any questions, ask your nurse or doctor.        aspirin 81 MG tablet Take 81 mg by mouth at bedtime.   diphenhydrAMINE 25 MG tablet Commonly known as: BENADRYL Take 25 mg by mouth at  bedtime.   GNP Glucosame Maximum Strength 1000 MG Tabs Generic drug: Glucosamine Sulfate Take 1,500 mg by mouth at bedtime.   hydrochlorothiazide 25 MG tablet Commonly known as: HYDRODIURIL Take 1 tablet (25 mg total) by mouth daily.   Multiple Vitamin tablet Take 1 tablet by mouth daily. Silver   omeprazole 40 MG capsule Commonly known as: PRILOSEC TAKE 1 CAPSULE DAILY   OVER THE COUNTER MEDICATION Apply 1 application topically daily as needed (pain). leg and back pain relief  CBD cream   Salonpas 3.05-30-08 % Ptch Generic drug: Camphor-Menthol-Methyl Sal Apply 1 patch topically daily as needed (Back pain).   sildenafil 100 MG tablet Commonly known as: VIAGRA Take 1 tablet (100 mg total) by mouth daily as needed for erectile dysfunction.   simvastatin 20 MG tablet Commonly known as: ZOCOR TAKE 1 TABLET AT BEDTIME   Turmeric 500 MG Caps Take 500 mg by mouth daily.       Allergies:  Allergies  Allergen Reactions  . Lovastatin Other (See Comments)    I      Family History: Family History  Problem Relation Age of Onset  . Heart attack Mother   . Heart attack Father   . Prostate cancer Neg Hx   . Kidney cancer Neg Hx     Social History:  reports that he quit smoking about 17 years ago. His smoking use included cigarettes. His smokeless  tobacco use includes chew. He reports current alcohol use of about 70.0 standard drinks of alcohol per week. He reports that he does not use drugs.   Physical Exam: BP 132/78   Pulse 78   Ht 5\' 7"  (1.702 m)   Wt 182 lb (82.6 kg)   BMI 28.51 kg/m   Constitutional:  Alert and oriented, No acute distress. HEENT: [place] AT, moist mucus membranes.  Trachea midline, no masses. Cardiovascular: No clubbing, cyanosis, or edema. Respiratory: Normal respiratory effort, no increased work of breathing. GU: Prostate 50 g, smooth without nodules Psychiatric: Normal mood and affect.   Assessment & Plan:    1.  T1c low risk prostate  cancer  Stable PSA; benign DRE  He desires to continue active surveillance  6 month follow-up PSA DRE  Obtaining MRI/confirmatory biopsy 12/18 months after initial biopsy was again discussed and will discuss further on follow-up   [name], MD  [facility] [address] [place], [place] [address] [phone]

## 2020-10-12 ENCOUNTER — Other Ambulatory Visit: Payer: Self-pay | Admitting: Family Medicine

## 2020-10-12 DIAGNOSIS — I10 Essential (primary) hypertension: Secondary | ICD-10-CM

## 2020-10-21 ENCOUNTER — Other Ambulatory Visit: Payer: Self-pay | Admitting: Family Medicine

## 2020-10-21 DIAGNOSIS — I10 Essential (primary) hypertension: Secondary | ICD-10-CM

## 2020-10-21 DIAGNOSIS — E782 Mixed hyperlipidemia: Secondary | ICD-10-CM

## 2020-10-23 ENCOUNTER — Encounter: Payer: Self-pay | Admitting: Family Medicine

## 2020-11-13 ENCOUNTER — Encounter: Payer: Self-pay | Admitting: Family Medicine

## 2021-03-05 ENCOUNTER — Telehealth: Payer: Self-pay

## 2021-03-05 DIAGNOSIS — N529 Male erectile dysfunction, unspecified: Secondary | ICD-10-CM

## 2021-03-05 NOTE — Telephone Encounter (Signed)
Copied from [place] [id number]. Topic: General - Call Back - No Documentation >> Mar 05, 2021 11:45 AM Erick Blinks wrote: Reason for CRM: Pt mailed in Prior Authorization request for his viagra Rx, please advise wants to know if PCP has signed the paperwork?  Best contact: [phone]

## 2021-03-07 NOTE — Telephone Encounter (Signed)
I got a request to send a prescription to [facility], not a prior authorization. Does he want the prescription sent o [place]?

## 2021-03-10 NOTE — Telephone Encounter (Signed)
I called and spoke with patient.He says he didn't mean to say "prior authorization". He does want a prescription for Viagra to be sent to [facility].

## 2021-03-11 ENCOUNTER — Other Ambulatory Visit: Payer: Self-pay

## 2021-03-11 MED ORDER — SILDENAFIL CITRATE 100 MG PO TABS: 100.0000 mg | ORAL_TABLET | Freq: Every day | ORAL | 3 refills | Status: DC | PRN

## 2021-03-14 ENCOUNTER — Ambulatory Visit: Payer: Self-pay | Admitting: Urology

## 2021-03-17 ENCOUNTER — Other Ambulatory Visit: Payer: Self-pay

## 2021-03-17 ENCOUNTER — Other Ambulatory Visit: Payer: Medicare HMO

## 2021-03-17 DIAGNOSIS — C61 Malignant neoplasm of prostate: Secondary | ICD-10-CM

## 2021-03-18 LAB — PSA: Prostate Specific Ag, Serum: 14.3 ng/mL — ABNORMAL HIGH (ref 0.0–4.0)

## 2021-03-19 ENCOUNTER — Other Ambulatory Visit: Payer: Self-pay

## 2021-03-19 ENCOUNTER — Encounter: Payer: Self-pay | Admitting: Urology

## 2021-03-19 ENCOUNTER — Ambulatory Visit: Payer: Medicare HMO | Admitting: Urology

## 2021-03-19 VITALS — BP 122/76 | HR 89 | Ht 67.0 in | Wt 172.0 lb

## 2021-03-19 DIAGNOSIS — C61 Malignant neoplasm of prostate: Secondary | ICD-10-CM | POA: Diagnosis not present

## 2021-03-19 LAB — URINALYSIS, COMPLETE
Bilirubin, UA: NEGATIVE
Glucose, UA: NEGATIVE
Ketones, UA: NEGATIVE
Leukocytes,UA: NEGATIVE
Nitrite, UA: NEGATIVE
Protein,UA: NEGATIVE
RBC, UA: NEGATIVE
Specific Gravity, UA: 1.015 (ref 1.005–1.030)
Urobilinogen, Ur: 0.2 mg/dL (ref 0.2–1.0)
pH, UA: 6.5 (ref 5.0–7.5)

## 2021-03-19 MED ORDER — TAMSULOSIN HCL 0.4 MG PO CAPS: 0.4000 mg | ORAL_CAPSULE | Freq: Every day | ORAL | 0 refills | Status: DC

## 2021-03-19 NOTE — Progress Notes (Signed)
03/19/2021 1:05 PM   Jason Holmes 1950/09/11 [id number]  Referring provider: Birdie Sons, MD [address] [place],  [place] [address]  Chief Complaint  Patient presents with   Prostate Cancer    Urologic history: 1.  T1c low risk prostate cancer -Prostate biopsy April 2018 for PSA of 6.8/right prostate nodule; prostate volume 44.5 cc; pathology HGPIN and focus ASAP -Prostate MRI 06/2017 consistent with BPH/no suspicious lesions -Repeat biopsy 03/05/2020 for PSA 10.9; 38 cc volume; 3/12 cores positive Gleason 3+3 adenocarcinoma all involving <50% of submitted tissue -Active surveillance elected   HPI: 70 y.o. male presents for semiannual follow up.  Doing well since last visit Noted some increased urinary hesitancy last week Denies dysuria, gross hematuria Denies flank, abdominal or pelvic pain PSA 03/17/2021 bumped to 14.3     PMH: Past Medical History:  Diagnosis Date   Elevated PSA    Erectile dysfunction    Hyperlipidemia    Hypertension    Sciatica    right    Surgical History: Past Surgical History:  Procedure Laterality Date   COLONOSCOPY N/A 02/24/2017   Procedure: COLONOSCOPY;  Surgeon: Lin Landsman, MD;  Location: [place];  Service: Endoscopy;  Laterality: N/A;   NASAL FRACTURE SURGERY  2005   PROSTATE BIOPSY     PROSTATE BIOPSY N/A 03/05/2020   Procedure: BIOPSY TRANSRECTAL ULTRASONIC PROSTATE (TUBP);  Surgeon: [name], MD;  Location: [facility];  Service: Urology;  Laterality: N/A;   TONSILLECTOMY  1972    Home Medications:  Allergies as of 03/19/2021       Reactions   Lovastatin Other (See Comments)   I         Medication List        Accurate as of March 19, 2021  1:05 PM. If you have any questions, ask your nurse or doctor.          aspirin 81 MG tablet Take 81 mg by mouth at bedtime.   diphenhydrAMINE 25 MG tablet Commonly known as: BENADRYL Take 25 mg by mouth at bedtime.    GNP Glucosame Maximum Strength 1000 MG Tabs Generic drug: Glucosamine Sulfate Take 1,500 mg by mouth at bedtime.   hydrochlorothiazide 25 MG tablet Commonly known as: HYDRODIURIL TAKE 1 TABLET DAILY   Multiple Vitamin tablet Take 1 tablet by mouth daily. Silver   omeprazole 40 MG capsule Commonly known as: PRILOSEC TAKE 1 CAPSULE DAILY   OVER THE COUNTER MEDICATION Apply 1 application topically daily as needed (pain). leg and back pain relief  CBD cream   Salonpas 3.05-30-08 % Ptch Generic drug: Camphor-Menthol-Methyl Sal Apply 1 patch topically daily as needed (Back pain).   sildenafil 100 MG tablet Commonly known as: VIAGRA Take 1 tablet (100 mg total) by mouth daily as needed for erectile dysfunction.   simvastatin 20 MG tablet Commonly known as: ZOCOR TAKE 1 TABLET AT BEDTIME   Turmeric 500 MG Caps Take 500 mg by mouth daily.        Allergies:  Allergies  Allergen Reactions   Lovastatin Other (See Comments)    I      Family History: Family History  Problem Relation Age of Onset   Heart attack Mother    Heart attack Father    Prostate cancer Neg Hx    Kidney cancer Neg Hx     Social History:  reports that he quit smoking about 18 years ago. His smoking use included cigarettes. His smokeless  tobacco use includes chew. He reports current alcohol use of about 70.0 standard drinks per week. He reports that he does not use drugs.   Physical Exam: BP 122/76   Pulse 89   Ht 5\' 7"  (1.702 m)   Wt 172 lb (78 kg)   BMI 26.94 kg/m   Constitutional:  Alert and oriented, No acute distress. HEENT: [place] AT, moist mucus membranes.  Trachea midline, no masses. Cardiovascular: No clubbing, cyanosis, or edema. Respiratory: Normal respiratory effort, no increased work of breathing. GU: 50 g, smooth without nodules Skin: No rashes, bruises or suspicious lesions. Neurologic: Grossly intact, no focal deficits, moving all 4 extremities. Psychiatric: Normal mood and  affect.   Assessment & Plan:    1.  T1c low risk prostate cancer PSA bump to 14.3 Stable DRE Check urinalysis Tamsulosin 0.4 mg daily x30 days Repeat PSA 4 weeks and if persistently elevated will order prostate MRI   [name], MD  [facility] [address], [place], [place] [address] [phone]

## 2021-03-27 DIAGNOSIS — H5203 Hypermetropia, bilateral: Secondary | ICD-10-CM | POA: Diagnosis not present

## 2021-03-27 DIAGNOSIS — H0288B Meibomian gland dysfunction left eye, upper and lower eyelids: Secondary | ICD-10-CM | POA: Diagnosis not present

## 2021-03-27 DIAGNOSIS — H40013 Open angle with borderline findings, low risk, bilateral: Secondary | ICD-10-CM | POA: Diagnosis not present

## 2021-03-27 DIAGNOSIS — H0288A Meibomian gland dysfunction right eye, upper and lower eyelids: Secondary | ICD-10-CM | POA: Diagnosis not present

## 2021-03-27 DIAGNOSIS — H524 Presbyopia: Secondary | ICD-10-CM | POA: Diagnosis not present

## 2021-03-27 DIAGNOSIS — H0102A Squamous blepharitis right eye, upper and lower eyelids: Secondary | ICD-10-CM | POA: Diagnosis not present

## 2021-03-27 DIAGNOSIS — L718 Other rosacea: Secondary | ICD-10-CM | POA: Diagnosis not present

## 2021-03-27 DIAGNOSIS — H25013 Cortical age-related cataract, bilateral: Secondary | ICD-10-CM | POA: Diagnosis not present

## 2021-03-27 DIAGNOSIS — H0102B Squamous blepharitis left eye, upper and lower eyelids: Secondary | ICD-10-CM | POA: Diagnosis not present

## 2021-03-27 DIAGNOSIS — H52223 Regular astigmatism, bilateral: Secondary | ICD-10-CM | POA: Diagnosis not present

## 2021-03-28 DIAGNOSIS — Z0100 Encounter for examination of eyes and vision without abnormal findings: Secondary | ICD-10-CM | POA: Diagnosis not present

## 2021-04-22 ENCOUNTER — Other Ambulatory Visit: Payer: Self-pay

## 2021-04-22 ENCOUNTER — Other Ambulatory Visit: Payer: Medicare HMO

## 2021-04-22 DIAGNOSIS — C61 Malignant neoplasm of prostate: Secondary | ICD-10-CM

## 2021-04-22 DIAGNOSIS — N529 Male erectile dysfunction, unspecified: Secondary | ICD-10-CM | POA: Diagnosis not present

## 2021-04-22 DIAGNOSIS — Z791 Long term (current) use of non-steroidal anti-inflammatories (NSAID): Secondary | ICD-10-CM | POA: Diagnosis not present

## 2021-04-22 DIAGNOSIS — G8929 Other chronic pain: Secondary | ICD-10-CM | POA: Diagnosis not present

## 2021-04-22 DIAGNOSIS — Z7982 Long term (current) use of aspirin: Secondary | ICD-10-CM | POA: Diagnosis not present

## 2021-04-22 DIAGNOSIS — I10 Essential (primary) hypertension: Secondary | ICD-10-CM | POA: Diagnosis not present

## 2021-04-22 DIAGNOSIS — M545 Low back pain, unspecified: Secondary | ICD-10-CM | POA: Diagnosis not present

## 2021-04-22 DIAGNOSIS — E785 Hyperlipidemia, unspecified: Secondary | ICD-10-CM | POA: Diagnosis not present

## 2021-04-22 DIAGNOSIS — M1990 Unspecified osteoarthritis, unspecified site: Secondary | ICD-10-CM | POA: Diagnosis not present

## 2021-04-22 DIAGNOSIS — K219 Gastro-esophageal reflux disease without esophagitis: Secondary | ICD-10-CM | POA: Diagnosis not present

## 2021-04-23 ENCOUNTER — Encounter: Payer: Self-pay | Admitting: *Deleted

## 2021-04-23 ENCOUNTER — Telehealth: Payer: Self-pay | Admitting: Urology

## 2021-04-23 DIAGNOSIS — C61 Malignant neoplasm of prostate: Secondary | ICD-10-CM

## 2021-04-23 LAB — PSA: Prostate Specific Ag, Serum: 13.7 ng/mL — ABNORMAL HIGH (ref 0.0–4.0)

## 2021-04-23 NOTE — Telephone Encounter (Signed)
Repeat PSA still elevated above baseline at 13.7.  At last office visit discussed prostate MRI if PSA remains persistently elevated.  MRI was ordered and will call with results.

## 2021-05-06 ENCOUNTER — Other Ambulatory Visit: Payer: Self-pay

## 2021-05-06 ENCOUNTER — Ambulatory Visit
Admission: RE | Admit: 2021-05-06 | Discharge: 2021-05-06 | Disposition: A | Discharge status: Home / Self Care | Payer: Medicare HMO | Source: Ambulatory Visit | Attending: Urology | Admitting: Urology

## 2021-05-06 DIAGNOSIS — C61 Malignant neoplasm of prostate: Secondary | ICD-10-CM | POA: Insufficient documentation

## 2021-05-06 DIAGNOSIS — Z8546 Personal history of malignant neoplasm of prostate: Secondary | ICD-10-CM | POA: Diagnosis not present

## 2021-05-06 DIAGNOSIS — R972 Elevated prostate specific antigen [PSA]: Secondary | ICD-10-CM | POA: Diagnosis not present

## 2021-05-06 DIAGNOSIS — R590 Localized enlarged lymph nodes: Secondary | ICD-10-CM | POA: Diagnosis not present

## 2021-05-06 DIAGNOSIS — K5730 Diverticulosis of large intestine without perforation or abscess without bleeding: Secondary | ICD-10-CM | POA: Diagnosis not present

## 2021-05-06 MED ORDER — GADOBUTROL 1 MMOL/ML IV SOLN
7.0000 mL | Freq: Once | INTRAVENOUS | Status: AC | PRN
  Administered 2021-05-06: 7 mL via INTRAVENOUS

## 2021-05-09 ENCOUNTER — Encounter: Payer: Self-pay | Admitting: Urology

## 2021-05-25 DIAGNOSIS — C44311 Basal cell carcinoma of skin of nose: Secondary | ICD-10-CM

## 2021-05-25 HISTORY — DX: Basal cell carcinoma of skin of nose: C44.311

## 2021-05-31 ENCOUNTER — Other Ambulatory Visit: Payer: Self-pay | Admitting: Family Medicine

## 2021-05-31 NOTE — Telephone Encounter (Signed)
Requested Prescriptions  Pending Prescriptions Disp Refills   omeprazole (PRILOSEC) 40 MG capsule [Pharmacy Med Name: OMEPRAZOLE CAP 40MG ] 90 capsule 1    Sig: TAKE 1 CAPSULE DAILY     Gastroenterology: Proton Pump Inhibitors Passed - 05/31/2021  3:50 PM      Passed - Valid encounter within last 12 months    Recent Outpatient Visits          8 months ago Essential (primary) hypertension   [facility] Birdie Sons, MD   1 year ago Acute gastritis without hemorrhage, unspecified gastritis type   [facility] Birdie Sons, MD   1 year ago Peptic ulcer   [facility] [place], Kelby Aline, [place]   2 years ago Annual physical exam   [facility] Birdie Sons, MD   4 years ago Annual physical exam   [facility] Birdie Sons, MD      Future Appointments            In 3 months Stoioff, Ronda Fairly, MD [facility]

## 2021-08-20 DIAGNOSIS — L570 Actinic keratosis: Secondary | ICD-10-CM | POA: Diagnosis not present

## 2021-08-20 DIAGNOSIS — Z85828 Personal history of other malignant neoplasm of skin: Secondary | ICD-10-CM | POA: Diagnosis not present

## 2021-08-20 DIAGNOSIS — X32XXXA Exposure to sunlight, initial encounter: Secondary | ICD-10-CM | POA: Diagnosis not present

## 2021-08-20 DIAGNOSIS — L298 Other pruritus: Secondary | ICD-10-CM | POA: Diagnosis not present

## 2021-08-20 DIAGNOSIS — L538 Other specified erythematous conditions: Secondary | ICD-10-CM | POA: Diagnosis not present

## 2021-08-20 DIAGNOSIS — D2272 Melanocytic nevi of left lower limb, including hip: Secondary | ICD-10-CM | POA: Diagnosis not present

## 2021-08-20 DIAGNOSIS — D2261 Melanocytic nevi of right upper limb, including shoulder: Secondary | ICD-10-CM | POA: Diagnosis not present

## 2021-08-20 DIAGNOSIS — D2262 Melanocytic nevi of left upper limb, including shoulder: Secondary | ICD-10-CM | POA: Diagnosis not present

## 2021-08-20 DIAGNOSIS — L820 Inflamed seborrheic keratosis: Secondary | ICD-10-CM | POA: Diagnosis not present

## 2021-09-08 ENCOUNTER — Other Ambulatory Visit: Payer: Medicare HMO

## 2021-09-10 ENCOUNTER — Other Ambulatory Visit: Payer: Self-pay

## 2021-09-10 DIAGNOSIS — C61 Malignant neoplasm of prostate: Secondary | ICD-10-CM

## 2021-09-11 ENCOUNTER — Ambulatory Visit: Payer: Medicare HMO | Admitting: Urology

## 2021-09-12 ENCOUNTER — Ambulatory Visit: Payer: Medicare HMO | Admitting: Urology

## 2021-09-15 ENCOUNTER — Other Ambulatory Visit: Payer: Medicare HMO

## 2021-09-23 ENCOUNTER — Other Ambulatory Visit: Payer: Medicare HMO

## 2021-09-23 DIAGNOSIS — C61 Malignant neoplasm of prostate: Secondary | ICD-10-CM

## 2021-09-24 LAB — PSA: Prostate Specific Ag, Serum: 15.7 ng/mL — ABNORMAL HIGH (ref 0.0–4.0)

## 2021-09-26 ENCOUNTER — Ambulatory Visit: Payer: Medicare HMO | Admitting: Urology

## 2021-10-07 ENCOUNTER — Other Ambulatory Visit: Payer: Self-pay | Admitting: Family Medicine

## 2021-10-07 DIAGNOSIS — H25013 Cortical age-related cataract, bilateral: Secondary | ICD-10-CM | POA: Diagnosis not present

## 2021-10-07 DIAGNOSIS — H40013 Open angle with borderline findings, low risk, bilateral: Secondary | ICD-10-CM | POA: Diagnosis not present

## 2021-10-07 DIAGNOSIS — H0102A Squamous blepharitis right eye, upper and lower eyelids: Secondary | ICD-10-CM | POA: Diagnosis not present

## 2021-10-07 DIAGNOSIS — H0102B Squamous blepharitis left eye, upper and lower eyelids: Secondary | ICD-10-CM | POA: Diagnosis not present

## 2021-10-07 DIAGNOSIS — H0288A Meibomian gland dysfunction right eye, upper and lower eyelids: Secondary | ICD-10-CM | POA: Diagnosis not present

## 2021-10-07 DIAGNOSIS — H0288B Meibomian gland dysfunction left eye, upper and lower eyelids: Secondary | ICD-10-CM | POA: Diagnosis not present

## 2021-10-07 DIAGNOSIS — I10 Essential (primary) hypertension: Secondary | ICD-10-CM

## 2021-10-07 DIAGNOSIS — L718 Other rosacea: Secondary | ICD-10-CM | POA: Diagnosis not present

## 2021-10-09 ENCOUNTER — Ambulatory Visit: Payer: Medicare HMO | Admitting: Urology

## 2021-10-09 ENCOUNTER — Encounter: Payer: Self-pay | Admitting: Urology

## 2021-10-09 VITALS — BP 144/82 | HR 60 | Ht 66.0 in | Wt 170.0 lb

## 2021-10-09 DIAGNOSIS — N401 Enlarged prostate with lower urinary tract symptoms: Secondary | ICD-10-CM

## 2021-10-09 DIAGNOSIS — N138 Other obstructive and reflux uropathy: Secondary | ICD-10-CM | POA: Diagnosis not present

## 2021-10-09 DIAGNOSIS — Z8546 Personal history of malignant neoplasm of prostate: Secondary | ICD-10-CM | POA: Diagnosis not present

## 2021-10-09 LAB — URINALYSIS, COMPLETE
Bilirubin, UA: NEGATIVE
Glucose, UA: NEGATIVE
Ketones, UA: NEGATIVE
Leukocytes,UA: NEGATIVE
Nitrite, UA: NEGATIVE
Protein,UA: NEGATIVE
RBC, UA: NEGATIVE
Specific Gravity, UA: 1.015 (ref 1.005–1.030)
Urobilinogen, Ur: 0.2 mg/dL (ref 0.2–1.0)
pH, UA: 7 (ref 5.0–7.5)

## 2021-10-09 LAB — MICROSCOPIC EXAMINATION
Bacteria, UA: NONE SEEN
Epithelial Cells (non renal): NONE SEEN /hpf (ref 0–10)

## 2021-10-09 NOTE — Progress Notes (Signed)
10/09/2021 11:30 AM   Jason Holmes 07-24-1950 [id number]  Referring provider: Birdie Sons, MD [address] [place],  [place] [address]  Chief Complaint  Patient presents with   Prostate Cancer    Urologic history: 1.  T1c low risk prostate cancer -Prostate biopsy April 2018 for PSA of 6.8/right prostate nodule; prostate volume 44.5 cc; pathology HGPIN and focus ASAP -Prostate MRI 06/2017 consistent with BPH/no suspicious lesions -Repeat biopsy 03/05/2020 for PSA 10.9; 38 cc volume; 3/12 cores positive Gleason 3+3 adenocarcinoma all involving <50% of submitted tissue -Active surveillance elected   HPI: 71 y.o. male presents for semiannual follow up.  At last visit November 2022 PSA increased to 14.3 Prostate MRI performed 04/2021 with a 59 cc gland.  No suspicious lesions noted and some findings felt indicative of possible prostatitis.  Overall categorized at PI-RADS 2 No bothersome LUTS Denies dysuria, gross hematuria Denies flank, abdominal or pelvic pain PSA 09/23/2021 increased to 15.7   PMH: Past Medical History:  Diagnosis Date   Elevated PSA    Erectile dysfunction    Hyperlipidemia    Hypertension    Sciatica    right    Surgical History: Past Surgical History:  Procedure Laterality Date   COLONOSCOPY N/A 02/24/2017   Procedure: COLONOSCOPY;  Surgeon: Lin Landsman, MD;  Location: [place];  Service: Endoscopy;  Laterality: N/A;   NASAL FRACTURE SURGERY  2005   PROSTATE BIOPSY     PROSTATE BIOPSY N/A 03/05/2020   Procedure: BIOPSY TRANSRECTAL ULTRASONIC PROSTATE (TUBP);  Surgeon: [name], MD;  Location: [facility];  Service: Urology;  Laterality: N/A;   TONSILLECTOMY  1972    Home Medications:  Allergies as of 10/09/2021       Reactions   Lovastatin Other (See Comments)   I         Medication List        Accurate as of Oct 09, 2021 11:30 AM. If you have any questions, ask your nurse or doctor.           STOP taking these medications    tamsulosin 0.4 MG Caps capsule Commonly known as: FLOMAX Stopped by: [name], MD       TAKE these medications    aspirin 81 MG tablet Take 81 mg by mouth at bedtime.   diphenhydrAMINE 25 MG tablet Commonly known as: BENADRYL Take 25 mg by mouth at bedtime.   GNP Glucosame Maximum Strength 1000 MG Tabs Generic drug: Glucosamine Sulfate Take 1,500 mg by mouth at bedtime.   hydrochlorothiazide 25 MG tablet Commonly known as: HYDRODIURIL TAKE 1 TABLET DAILY   Multiple Vitamin tablet Take 1 tablet by mouth daily. Silver   omeprazole 40 MG capsule Commonly known as: PRILOSEC TAKE 1 CAPSULE DAILY   OVER THE COUNTER MEDICATION Apply 1 application topically daily as needed (pain). leg and back pain relief  CBD cream   Salonpas 3.05-30-08 % Ptch Generic drug: Camphor-Menthol-Methyl Sal Apply 1 patch topically daily as needed (Back pain).   sildenafil 100 MG tablet Commonly known as: VIAGRA Take 1 tablet (100 mg total) by mouth daily as needed for erectile dysfunction.   simvastatin 20 MG tablet Commonly known as: ZOCOR TAKE 1 TABLET AT BEDTIME   Turmeric 500 MG Caps Take 500 mg by mouth daily.        Allergies:  Allergies  Allergen Reactions   Lovastatin Other (See Comments)    I  Family History: Family History  Problem Relation Age of Onset   Heart attack Mother    Heart attack Father    Prostate cancer Neg Hx    Kidney cancer Neg Hx     Social History:  reports that he quit smoking about 18 years ago. His smoking use included cigarettes. His smokeless tobacco use includes chew. He reports current alcohol use of about 70.0 standard drinks per week. He reports that he does not use drugs.   Physical Exam: BP (!) 144/82   Pulse 60   Ht '5\' 6"'$  (1.676 m)   Wt 170 lb (77.1 kg)   BMI 27.44 kg/m   Constitutional:  Alert and oriented, No acute distress. HEENT: [place] AT, moist mucus membranes.  Trachea  midline, no masses. Cardiovascular: No clubbing, cyanosis, or edema. Respiratory: Normal respiratory effort, no increased work of breathing. GU: 50 g, increased firmness noted on left Skin: No rashes, bruises or suspicious lesions. Neurologic: Grossly intact, no focal deficits, moving all 4 extremities. Psychiatric: Normal mood and affect.   Assessment & Plan:    1.  T1c low risk prostate cancer PSA increasing 15.7 Recent negative MRI Abnormal DRE today not previously noted Check urinalysis Above findings were discussed.  PSAD 0.26 Recommend scheduling standard prostate biopsy.  He would like to have performed in same-day surgery under sedation and will send  scheduling order   [name], [facility] [address], [place], [place] [address] [phone]

## 2021-10-09 NOTE — H&P (View-Only) (Signed)
10/09/2021 11:30 AM   Jason Holmes July 14, 1950 [id number]  Referring provider: Birdie Sons, MD [address] [place],  [place] [address]  Chief Complaint  Patient presents with   Prostate Cancer    Urologic history: 1.  T1c low risk prostate cancer -Prostate biopsy April 2018 for PSA of 6.8/right prostate nodule; prostate volume 44.5 cc; pathology HGPIN and focus ASAP -Prostate MRI 06/2017 consistent with BPH/no suspicious lesions -Repeat biopsy 03/05/2020 for PSA 10.9; 38 cc volume; 3/12 cores positive Gleason 3+3 adenocarcinoma all involving <50% of submitted tissue -Active surveillance elected   HPI: 71 y.o. male presents for semiannual follow up.  At last visit November 2022 PSA increased to 14.3 Prostate MRI performed 04/2021 with a 59 cc gland.  No suspicious lesions noted and some findings felt indicative of possible prostatitis.  Overall categorized at PI-RADS 2 No bothersome LUTS Denies dysuria, gross hematuria Denies flank, abdominal or pelvic pain PSA 09/23/2021 increased to 15.7   PMH: Past Medical History:  Diagnosis Date   Elevated PSA    Erectile dysfunction    Hyperlipidemia    Hypertension    Sciatica    right    Surgical History: Past Surgical History:  Procedure Laterality Date   COLONOSCOPY N/A 02/24/2017   Procedure: COLONOSCOPY;  Surgeon: Lin Landsman, MD;  Location: [facility] [facility];  Service: Endoscopy;  Laterality: N/A;   NASAL FRACTURE SURGERY  2005   PROSTATE BIOPSY     PROSTATE BIOPSY N/A 03/05/2020   Procedure: BIOPSY TRANSRECTAL ULTRASONIC PROSTATE (TUBP);  Surgeon: [name], MD;  Location: [facility];  Service: Urology;  Laterality: N/A;   TONSILLECTOMY  1972    Home Medications:  Allergies as of 10/09/2021       Reactions   Lovastatin Other (See Comments)   I         Medication List        Accurate as of Oct 09, 2021 11:30 AM. If you have any questions, ask your nurse or doctor.           STOP taking these medications    tamsulosin 0.4 MG Caps capsule Commonly known as: FLOMAX Stopped by: [name], MD       TAKE these medications    aspirin 81 MG tablet Take 81 mg by mouth at bedtime.   diphenhydrAMINE 25 MG tablet Commonly known as: BENADRYL Take 25 mg by mouth at bedtime.   GNP Glucosame Maximum Strength 1000 MG Tabs Generic drug: Glucosamine Sulfate Take 1,500 mg by mouth at bedtime.   hydrochlorothiazide 25 MG tablet Commonly known as: HYDRODIURIL TAKE 1 TABLET DAILY   Multiple Vitamin tablet Take 1 tablet by mouth daily. Silver   omeprazole 40 MG capsule Commonly known as: PRILOSEC TAKE 1 CAPSULE DAILY   OVER THE COUNTER MEDICATION Apply 1 application topically daily as needed (pain). leg and back pain relief  CBD cream   Salonpas 3.05-30-08 % Ptch Generic drug: Camphor-Menthol-Methyl Sal Apply 1 patch topically daily as needed (Back pain).   sildenafil 100 MG tablet Commonly known as: VIAGRA Take 1 tablet (100 mg total) by mouth daily as needed for erectile dysfunction.   simvastatin 20 MG tablet Commonly known as: ZOCOR TAKE 1 TABLET AT BEDTIME   Turmeric 500 MG Caps Take 500 mg by mouth daily.        Allergies:  Allergies  Allergen Reactions   Lovastatin Other (See Comments)    I  Family History: Family History  Problem Relation Age of Onset   Heart attack Mother    Heart attack Father    Prostate cancer Neg Hx    Kidney cancer Neg Hx     Social History:  reports that he quit smoking about 18 years ago. His smoking use included cigarettes. His smokeless tobacco use includes chew. He reports current alcohol use of about 70.0 standard drinks per week. He reports that he does not use drugs.   Physical Exam: BP (!) 144/82   Pulse 60   Ht '5\' 6"'$  (1.676 m)   Wt 170 lb (77.1 kg)   BMI 27.44 kg/m   Constitutional:  Alert and oriented, No acute distress. HEENT: [place] AT, moist mucus membranes.  Trachea  midline, no masses. Cardiovascular: No clubbing, cyanosis, or edema. Respiratory: Normal respiratory effort, no increased work of breathing. GU: 50 g, increased firmness noted on left Skin: No rashes, bruises or suspicious lesions. Neurologic: Grossly intact, no focal deficits, moving all 4 extremities. Psychiatric: Normal mood and affect.   Assessment & Plan:    1.  T1c low risk prostate cancer PSA increasing 15.7 Recent negative MRI Abnormal DRE today not previously noted Check urinalysis Above findings were discussed.  PSAD 0.26 Recommend scheduling standard prostate biopsy.  He would like to have performed in same-day surgery under sedation and will send  scheduling order   [name], [facility] [address] [place], [place] [address] [phone]

## 2021-10-10 ENCOUNTER — Other Ambulatory Visit: Payer: Self-pay | Admitting: Urology

## 2021-10-10 ENCOUNTER — Encounter: Payer: Self-pay | Admitting: Urology

## 2021-10-10 DIAGNOSIS — C61 Malignant neoplasm of prostate: Secondary | ICD-10-CM

## 2021-10-10 NOTE — Progress Notes (Signed)
Surgical Physician [facility]  * Scheduling expectation : Next Available  *Length of Case: 30 minutes  *Clearance needed: no  *Anticoagulation Instructions: N/A  *Aspirin Instructions: Hold Aspirin  *Post-op visit Date/Instructions:  1-2 week with pathology review  *Diagnosis:  Low risk prostate cancer  *Procedure:  Prostate Biopsy (55700) TRUS (62947) [place] MLYYT(03546) Nerve block (56812)   Additional orders: N/A  -Admit type: OUTpatient  -Anesthesia: MAC  -VTE Prophylaxis Standing Order SCD's       Other:   -Standing Lab Orders Per Anesthesia    Lab other: UA&Urine Culture  -Standing Test orders EKG/Chest x-ray per Anesthesia       Test other:   - Medications:  Ceftriaxone(Rocephin) 1gm IV  -Other orders:  Fleets enema AM

## 2021-10-21 ENCOUNTER — Telehealth: Payer: Self-pay

## 2021-10-21 NOTE — Telephone Encounter (Signed)
I spoke with Jason Holmes. We have discussed possible surgery dates and Tuesday June 13th, 2023 was agreed upon by all parties. Patient given information about surgery date, what to expect pre-operatively and post operatively.  We discussed that a Pre-Admission Testing office will be calling to set up the pre-op visit that will take place prior to surgery, and that these appointments are typically done over the phone with a Pre-Admissions RN.  Informed patient that our office will communicate any additional care to be provided after surgery. Patients questions or concerns were discussed during our call. Advised to call our office should there be any additional information, questions or concerns that arise. Patient verbalized understanding.

## 2021-10-21 NOTE — Progress Notes (Signed)
[facility] Posting Form   Surgery Date/Time: Date: 11/04/2021  Surgeon: Dr. John Giovanni, MD  Surgery Location: Day Surgery  Inpt ( No  )   Outpt (Yes)   Obs ( No  )   Diagnosis: Prostate Cancer C61  -CPT: 55700, [id number], [id number], [phone]  Surgery: Transrectal Ultrasound Guided Prostate Biopsy with Nerve Block.  Stop Anticoagulations: Hold ASA  Cardiac/Medical/Pulmonary Clearance needed: no  *Orders entered into EPIC  Date: 10/21/21   *Case booked in EPIC  Date: 10/17/2021  *Notified pt of Surgery: Date: 10/17/2021  PRE-OP UA & CX: yes, will obtain on 10/23/2021  *Placed into Prior Authorization Work Fabio Bering Date: 10/21/21   Assistant/laser/rep:No

## 2021-10-22 ENCOUNTER — Telehealth: Payer: Self-pay | Admitting: Family Medicine

## 2021-10-22 ENCOUNTER — Other Ambulatory Visit: Payer: Self-pay | Admitting: Urology

## 2021-10-22 DIAGNOSIS — R972 Elevated prostate specific antigen [PSA]: Secondary | ICD-10-CM

## 2021-10-22 DIAGNOSIS — E782 Mixed hyperlipidemia: Secondary | ICD-10-CM

## 2021-10-22 NOTE — Telephone Encounter (Signed)
Not seen in over a year. Needs to schedule o.v. within the next 2 months before refill can be approved.

## 2021-10-23 ENCOUNTER — Other Ambulatory Visit: Payer: Medicare HMO

## 2021-10-23 DIAGNOSIS — C61 Malignant neoplasm of prostate: Secondary | ICD-10-CM

## 2021-10-23 DIAGNOSIS — N401 Enlarged prostate with lower urinary tract symptoms: Secondary | ICD-10-CM | POA: Diagnosis not present

## 2021-10-23 LAB — URINALYSIS, COMPLETE
Bilirubin, UA: NEGATIVE
Glucose, UA: NEGATIVE
Ketones, UA: NEGATIVE
Leukocytes,UA: NEGATIVE
Nitrite, UA: NEGATIVE
Protein,UA: NEGATIVE
RBC, UA: NEGATIVE
Specific Gravity, UA: 1.015 (ref 1.005–1.030)
Urobilinogen, Ur: 0.2 mg/dL (ref 0.2–1.0)
pH, UA: 7 (ref 5.0–7.5)

## 2021-10-23 LAB — MICROSCOPIC EXAMINATION
Bacteria, UA: NONE SEEN
RBC, Urine: NONE SEEN /hpf (ref 0–2)

## 2021-10-24 ENCOUNTER — Encounter: Payer: Self-pay | Admitting: Family Medicine

## 2021-10-24 DIAGNOSIS — E782 Mixed hyperlipidemia: Secondary | ICD-10-CM

## 2021-10-24 MED ORDER — SIMVASTATIN 20 MG PO TABS: 20.0000 mg | ORAL_TABLET | Freq: Every day | ORAL | 0 refills | Status: DC

## 2021-10-27 LAB — CULTURE, URINE COMPREHENSIVE

## 2021-10-30 ENCOUNTER — Other Ambulatory Visit: Payer: Self-pay

## 2021-10-30 ENCOUNTER — Encounter
Admission: RE | Admit: 2021-10-30 | Discharge: 2021-10-30 | Disposition: A | Discharge status: Home / Self Care | Payer: Medicare HMO | Source: Ambulatory Visit | Attending: Urology | Admitting: Urology

## 2021-10-30 DIAGNOSIS — Z01812 Encounter for preprocedural laboratory examination: Secondary | ICD-10-CM

## 2021-10-30 DIAGNOSIS — I10 Essential (primary) hypertension: Secondary | ICD-10-CM

## 2021-10-30 HISTORY — DX: Anemia, unspecified: D64.9

## 2021-10-30 NOTE — Patient Instructions (Addendum)
Your procedure is scheduled on: 11/04/21 - Tuesday Report to the Registration Desk on the 1st floor of the [facility]. To find out your arrival time, please call [phone] between 1PM - 3PM on: 11/03/21 - Monday If your arrival time is 6:00 am, do not arrive prior to that time as the [facility] entrance doors do not open until 6:00 am. Report to [facility] on 11/03/21 at 11 am for labs/ekg.  REMEMBER: Instructions that are not followed completely may result in serious medical risk, up to and including death; or upon the discretion of your surgeon and anesthesiologist your surgery may need to be rescheduled.  Do not eat food or drink any fluids after midnight the night before surgery.  No gum chewing, lozengers or hard candies.  TAKE Only THESE MEDICATIONS THE MORNING OF SURGERY WITH A SIP OF WATER:  - omeprazole (PRILOSEC) 40 MG capsule, (take one the night before and one on the morning of surgery - helps to prevent nausea after surgery.)  Stop beginning today - 10/30/21 Anti-inflammatories (NSAIDS) such as Advil, Aleve, Ibuprofen, Motrin, Naproxen, Naprosyn and Aspirin based products such as Excedrin, Goodys Powder, BC Powder.  Stop ANY OVER THE COUNTER supplements beginning today, 10/30/21 until after surgery.Glucosamine Sulfate (GNP GLUCOSAME ), diphenhydrAMINE (BENADRYL) 25 MG , Multiple Vitamin tablet, CBD cream, Turmeric 500 MG CAPS.  You may take Tylenol if needed for pain up until the day of surgery.  No Alcohol for 24 hours before or after surgery.  No Smoking including e-cigarettes for 24 hours prior to surgery.  No chewable tobacco products for at least 6 hours prior to surgery.  No nicotine patches on the day of surgery.  Do not use any "recreational" drugs for at least a week prior to your surgery.  Please be advised that the combination of cocaine and anesthesia may have negative outcomes, up to and including death. If you test  positive for cocaine, your surgery will be cancelled.  On the morning of surgery brush your teeth with toothpaste and water, you may rinse your mouth with mouthwash if you wish. Do not swallow any toothpaste or mouthwash.  Do not wear jewelry, make-up, hairpins, clips or nail polish.  Do not wear lotions, powders, or perfumes.   Do not shave body from the neck down 48 hours prior to surgery just in case you cut yourself which could leave a site for infection.  Also, freshly shaved skin may become irritated if using the CHG soap.  Contact lenses, hearing aids and dentures may not be worn into surgery.  Do not bring valuables to the hospital. [facility] is not responsible for any missing/lost belongings or valuables.   Complete Fleets enema at home 2 hours prior to your arrival.  Notify your doctor if there is any change in your medical condition (cold, fever, infection).  Wear comfortable clothing (specific to your surgery type) to the hospital.  After surgery, you can help prevent lung complications by doing breathing exercises.  Take deep breaths and cough every 1-2 hours. Your doctor may order a device called an Incentive Spirometer to help you take deep breaths. When coughing or sneezing, hold a pillow firmly against your incision with both hands. This is called "splinting." Doing this helps protect your incision. It also decreases belly discomfort.  If you are being admitted to the hospital overnight, leave your suitcase in the car. After surgery it may be brought to your room.  If you are being discharged the day of surgery, you will not be allowed to drive home. You will need a responsible adult (18 years or older) to drive you home and stay with you that night.   If you are taking public transportation, you will need to have a responsible adult (18 years or older) with you. Please confirm with your physician that it is acceptable to use public transportation.   Please call  the [place] Dept[facility] at [phone] if you have any questions about these instructions.  Surgery Visitation Policy:  Patients undergoing a surgery or procedure may have two family members or support persons with them as long as the person is not COVID-19 positive or experiencing its symptoms.   Inpatient Visitation:    Visiting hours are 7 a.m. to 8 p.m. Up to four visitors are allowed at one time in a patient room, including children. The visitors may rotate out with other people during the day. One designated support person (adult) may remain overnight.

## 2021-11-03 ENCOUNTER — Ambulatory Visit (INDEPENDENT_AMBULATORY_CARE_PROVIDER_SITE_OTHER): Payer: Medicare HMO | Admitting: Physician Assistant

## 2021-11-03 ENCOUNTER — Encounter: Payer: Self-pay | Admitting: Physician Assistant

## 2021-11-03 ENCOUNTER — Encounter: Payer: Self-pay | Admitting: Urgent Care

## 2021-11-03 ENCOUNTER — Encounter
Admission: RE | Admit: 2021-11-03 | Discharge: 2021-11-03 | Disposition: A | Discharge status: Home / Self Care | Payer: Medicare HMO | Source: Ambulatory Visit | Attending: Urology | Admitting: Urology

## 2021-11-03 VITALS — BP 152/84 | HR 80 | Temp 97.9°F | Resp 16 | Wt 176.1 lb

## 2021-11-03 DIAGNOSIS — I10 Essential (primary) hypertension: Secondary | ICD-10-CM | POA: Diagnosis not present

## 2021-11-03 DIAGNOSIS — H1131 Conjunctival hemorrhage, right eye: Secondary | ICD-10-CM | POA: Diagnosis not present

## 2021-11-03 DIAGNOSIS — Z01818 Encounter for other preprocedural examination: Secondary | ICD-10-CM | POA: Diagnosis not present

## 2021-11-03 DIAGNOSIS — Z01812 Encounter for preprocedural laboratory examination: Secondary | ICD-10-CM

## 2021-11-03 DIAGNOSIS — Z01810 Encounter for preprocedural cardiovascular examination: Secondary | ICD-10-CM | POA: Diagnosis not present

## 2021-11-03 LAB — BASIC METABOLIC PANEL
Anion gap: 8 (ref 5–15)
BUN: 8 mg/dL (ref 8–23)
CO2: 28 mmol/L (ref 22–32)
Calcium: 10.5 mg/dL — ABNORMAL HIGH (ref 8.9–10.3)
Chloride: 99 mmol/L (ref 98–111)
Creatinine, Ser: 0.72 mg/dL (ref 0.61–1.24)
GFR, Estimated: >60 mL/min (ref >60)
Glucose, Bld: 91 mg/dL (ref 70–99)
Potassium: 3.7 mmol/L (ref 3.5–5.1)
Sodium: 135 mmol/L (ref 135–145)

## 2021-11-03 LAB — CBC
HCT: 37.6 % — ABNORMAL LOW (ref 39.0–52.0)
Hemoglobin: 12.5 g/dL — ABNORMAL LOW (ref 13.0–17.0)
MCH: 29.8 pg (ref 26.0–34.0)
MCHC: 33.2 g/dL (ref 30.0–36.0)
MCV: 89.7 fL (ref 80.0–100.0)
Platelets: 305 10*3/uL (ref 150–400)
RBC: 4.19 MIL/uL — ABNORMAL LOW (ref 4.22–5.81)
RDW: 12.6 % (ref 11.5–15.5)
WBC: 5.5 10*3/uL (ref 4.0–10.5)
nRBC: 0 % (ref 0.0–0.2)

## 2021-11-03 MED ORDER — LACTATED RINGERS IV SOLN: INTRAVENOUS | Status: DC

## 2021-11-03 NOTE — Progress Notes (Signed)
I,[name],acting as a Education administrator for [employer], PA-C.,have documented all relevant documentation on the behalf of Jason Speak, PA-C,as directed by  [employer], PA-C while in the presence of [employer], PA-C.   Established patient visit   Patient: Jason Holmes   DOB: [birth year]   71 y.o. Male  MRN: [id number] Visit Date: 11/03/2021  Today's healthcare provider: [name], PA-C   Chief Complaint  Patient presents with   Eye Problem       red eye  Subjective    Patient is a 71 yr old male presenting for right eye redness.  He is having surgery with urology / John Giovanni tomorrow and had pre-op this morning. Doctor wanted him to have it checked before coming in for procedure.  Wants to confirm it is not an infection.  Patient reports he was camping last week and thinks something got in it. Denies discomfort, visual changes or drainage.   Pt denies eye pain, a foreign body sensation, photophobia, no signs of URI.  Medications: Outpatient Medications Prior to Visit  Medication Sig   aspirin 81 MG tablet Take 81 mg by mouth at bedtime.    Camphor-Menthol-Methyl Sal (SALONPAS) 3.05-30-08 % PTCH Apply 1 patch topically daily as needed (Back pain).   diphenhydrAMINE (BENADRYL) 25 MG tablet Take 25 mg by mouth at bedtime.    Glucosamine Sulfate (GNP GLUCOSAME MAXIMUM STRENGTH) 1000 MG TABS Take 1,500 mg by mouth at bedtime.    hydrochlorothiazide (HYDRODIURIL) 25 MG tablet TAKE 1 TABLET DAILY   ibuprofen (ADVIL) 200 MG tablet Take 400 mg by mouth every 6 (six) hours as needed.   Multiple Vitamin tablet Take 1 tablet by mouth daily. Silver   omeprazole (PRILOSEC) 40 MG capsule TAKE 1 CAPSULE DAILY   OVER THE COUNTER MEDICATION Apply 1 application topically daily as needed (pain). leg and back pain relief  CBD cream   sildenafil (VIAGRA) 100 MG tablet Take 1 tablet (100 mg total) by mouth daily as needed for erectile dysfunction.   simvastatin (ZOCOR) 20 MG tablet Take  1 tablet (20 mg total) by mouth at bedtime.   Turmeric 500 MG CAPS Take 500 mg by mouth daily.   No facility-administered medications prior to visit.    Review of Systems  Eyes:  Positive for redness.  See hpi     Objective    BP (!) 152/84 (BP Location: Left Arm, Patient Position: Sitting, Cuff Size: Normal)   Pulse 80   Temp 97.9 F (36.6 C) (Oral)   Resp 16   Wt 176 lb 1.6 oz (79.9 kg)   SpO2 98%   BMI 28.42 kg/m    Physical Exam Vitals reviewed.  Constitutional:      General: He is not in acute distress.    Appearance: Normal appearance. He is not ill-appearing, toxic-appearing or diaphoretic.  HENT:     Head: Normocephalic and atraumatic.     Nose: Nose normal.  Eyes:     General: No scleral icterus.       Right eye: No discharge.        Left eye: No discharge.     Extraocular Movements: Extraocular movements intact.     Pupils: Pupils are equal, round, and reactive to light.     Comments: The bright red well-demarcated spots or patches on the nasal aspect of the right eye noted. Pupil is unaffected  Musculoskeletal:        General: Normal range of motion.  Neurological:     General: No focal deficit present.     Mental Status: He is alert and oriented to person, place, and time.  Psychiatric:        Behavior: Behavior normal.        Thought Content: Thought content normal.        Judgment: Judgment normal.      Results for orders placed or performed during the hospital encounter of 11/03/21  CBC  Result Value Ref Range   WBC 5.5 4.0 - 10.5 K/uL   RBC 4.19 (L) 4.22 - 5.81 MIL/uL   Hemoglobin 12.5 (L) 13.0 - 17.0 g/dL   HCT 37.6 (L) 39.0 - 52.0 %   MCV 89.7 80.0 - 100.0 fL   MCH 29.8 26.0 - 34.0 pg   MCHC 33.2 30.0 - 36.0 g/dL   RDW 12.6 11.5 - 15.5 %   Platelets 305 150 - 400 K/uL   nRBC 0.0 0.0 - 0.2 %  Basic metabolic panel  Result Value Ref Range   Sodium 135 135 - 145 mmol/L   Potassium 3.7 3.5 - 5.1 mmol/L   Chloride 99 98 - 111 mmol/L    CO2 28 22 - 32 mmol/L   Glucose, Bld 91 70 - 99 mg/dL   BUN 8 8 - 23 mg/dL   Creatinine, Ser 0.72 0.61 - 1.24 mg/dL   Calcium 10.5 (H) 8.9 - 10.3 mg/dL   GFR, Estimated >60 >60 mL/min   Anion gap 8 5 - 15    Assessment & Plan     1. Subconjunctival hemorrhage of right eye Continue artificial eye droops . Self-resolved in a week or more  Pt was reassured  The patient was advised to call back or seek an in-person evaluation if the symptoms worsen or if the condition fails to improve as anticipated.  I discussed the assessment and treatment plan with the patient. The patient was provided an opportunity to ask questions and all were answered. The patient agreed with the plan and demonstrated an understanding of the instructions.  The entirety of the information documented in the History of Present Illness, Review of Systems and Physical Exam were personally obtained by me. Portions of this information were initially documented by the CMA and reviewed by me for thoroughness and accuracy.  Portions of this note were created using dictation software and may contain typographical errors.       Total encounter time more than 20 minutes  Greater than 50% was spent in counseling and coordination of care with the patient  FU if needed    Jason Holmes, Hershal Coria  [facility] [phone] (phone) [phone] (fax)  [place]

## 2021-11-04 ENCOUNTER — Ambulatory Visit
Admission: RE | Admit: 2021-11-04 | Discharge: 2021-11-04 | Disposition: A | Discharge status: Home / Self Care | Payer: Medicare HMO | Attending: Urology | Admitting: Urology

## 2021-11-04 ENCOUNTER — Ambulatory Visit: Payer: Medicare HMO | Admitting: Anesthesiology

## 2021-11-04 ENCOUNTER — Other Ambulatory Visit: Payer: Self-pay

## 2021-11-04 ENCOUNTER — Ambulatory Visit
Admission: RE | Admit: 2021-11-04 | Discharge: 2021-11-04 | Disposition: A | Discharge status: Home / Self Care | Payer: Medicare HMO | Source: Ambulatory Visit | Attending: Urology | Admitting: Urology

## 2021-11-04 ENCOUNTER — Encounter: Payer: Self-pay | Admitting: Urology

## 2021-11-04 ENCOUNTER — Encounter
Admission: RE | Disposition: A | Discharge status: Home / Self Care | Payer: Self-pay | Source: Home / Self Care | Attending: Urology

## 2021-11-04 DIAGNOSIS — N529 Male erectile dysfunction, unspecified: Secondary | ICD-10-CM | POA: Diagnosis not present

## 2021-11-04 DIAGNOSIS — K219 Gastro-esophageal reflux disease without esophagitis: Secondary | ICD-10-CM | POA: Diagnosis not present

## 2021-11-04 DIAGNOSIS — C61 Malignant neoplasm of prostate: Secondary | ICD-10-CM | POA: Diagnosis not present

## 2021-11-04 DIAGNOSIS — I10 Essential (primary) hypertension: Secondary | ICD-10-CM | POA: Diagnosis not present

## 2021-11-04 DIAGNOSIS — E785 Hyperlipidemia, unspecified: Secondary | ICD-10-CM | POA: Insufficient documentation

## 2021-11-04 DIAGNOSIS — F17220 Nicotine dependence, chewing tobacco, uncomplicated: Secondary | ICD-10-CM | POA: Insufficient documentation

## 2021-11-04 DIAGNOSIS — R69 Illness, unspecified: Secondary | ICD-10-CM | POA: Diagnosis not present

## 2021-11-04 DIAGNOSIS — Z79899 Other long term (current) drug therapy: Secondary | ICD-10-CM | POA: Insufficient documentation

## 2021-11-04 DIAGNOSIS — R972 Elevated prostate specific antigen [PSA]: Secondary | ICD-10-CM

## 2021-11-04 HISTORY — PX: TRANSRECTAL ULTRASOUND: SHX5146

## 2021-11-04 HISTORY — PX: PROSTATE BIOPSY: SHX241

## 2021-11-04 SURGERY — BIOPSY, PROSTATE
Anesthesia: General | Site: Prostate

## 2021-11-04 MED ORDER — SODIUM CHLORIDE 0.9 % IV SOLN
1.0000 g | INTRAVENOUS | Status: AC
  Administered 2021-11-04: 2 g via INTRAVENOUS
  Filled 2021-11-04: qty 1

## 2021-11-04 MED ORDER — OXYCODONE HCL 5 MG PO TABS: 5.0000 mg | ORAL_TABLET | Freq: Once | ORAL | Status: DC | PRN

## 2021-11-04 MED ORDER — CHLORHEXIDINE GLUCONATE 0.12 % MT SOLN: 15.0000 mL | Freq: Once | OROMUCOSAL | Status: AC

## 2021-11-04 MED ORDER — MIDAZOLAM HCL 2 MG/2ML IJ SOLN
INTRAMUSCULAR | Status: DC | PRN
  Administered 2021-11-04: 2 mg via INTRAVENOUS

## 2021-11-04 MED ORDER — KETAMINE HCL 10 MG/ML IJ SOLN
INTRAMUSCULAR | Status: DC | PRN
  Administered 2021-11-04 (×3): 10 mg via INTRAVENOUS

## 2021-11-04 MED ORDER — ACETAMINOPHEN 10 MG/ML IV SOLN: 1000.0000 mg | Freq: Once | INTRAVENOUS | Status: DC | PRN

## 2021-11-04 MED ORDER — SODIUM CHLORIDE 0.9 % IV SOLN: Freq: Once | INTRAVENOUS | Status: AC

## 2021-11-04 MED ORDER — ONDANSETRON HCL 4 MG/2ML IJ SOLN: 4.0000 mg | Freq: Once | INTRAMUSCULAR | Status: DC | PRN

## 2021-11-04 MED ORDER — OXYCODONE HCL 5 MG/5ML PO SOLN: 5.0000 mg | Freq: Once | ORAL | Status: DC | PRN

## 2021-11-04 MED ORDER — KETAMINE HCL 50 MG/5ML IJ SOSY
PREFILLED_SYRINGE | INTRAMUSCULAR | Status: AC
  Filled 2021-11-04: qty 5

## 2021-11-04 MED ORDER — FENTANYL CITRATE (PF) 100 MCG/2ML IJ SOLN: 25.0000 ug | INTRAMUSCULAR | Status: DC | PRN

## 2021-11-04 MED ORDER — ORAL CARE MOUTH RINSE: 15.0000 mL | Freq: Once | OROMUCOSAL | Status: AC

## 2021-11-04 MED ORDER — FENTANYL CITRATE (PF) 100 MCG/2ML IJ SOLN
INTRAMUSCULAR | Status: AC
  Filled 2021-11-04: qty 2

## 2021-11-04 MED ORDER — PROPOFOL 1000 MG/100ML IV EMUL
INTRAVENOUS | Status: AC
  Filled 2021-11-04: qty 100

## 2021-11-04 MED ORDER — PROPOFOL 500 MG/50ML IV EMUL
INTRAVENOUS | Status: DC | PRN
  Administered 2021-11-04: 70 ug/kg/min via INTRAVENOUS

## 2021-11-04 MED ORDER — FLEET ENEMA 7-19 GM/118ML RE ENEM
1.0000 | ENEMA | Freq: Once | RECTAL | Status: AC
  Administered 2021-11-04: 1 via RECTAL

## 2021-11-04 MED ORDER — CHLORHEXIDINE GLUCONATE 0.12 % MT SOLN
OROMUCOSAL | Status: AC
  Administered 2021-11-04: 15 mL via OROMUCOSAL
  Filled 2021-11-04: qty 15

## 2021-11-04 MED ORDER — PROPOFOL 10 MG/ML IV BOLUS
INTRAVENOUS | Status: DC | PRN
  Administered 2021-11-04 (×2): 20 mg via INTRAVENOUS
  Administered 2021-11-04: 50 mg via INTRAVENOUS

## 2021-11-04 MED ORDER — FENTANYL CITRATE (PF) 100 MCG/2ML IJ SOLN
INTRAMUSCULAR | Status: DC | PRN
Start: 2021-11-04 — End: 2021-11-04
  Administered 2021-11-04: 50 ug via INTRAVENOUS

## 2021-11-04 MED ORDER — MIDAZOLAM HCL 2 MG/2ML IJ SOLN
INTRAMUSCULAR | Status: AC
  Filled 2021-11-04: qty 2

## 2021-11-04 SURGICAL SUPPLY — 7 items
COVER MAYO STAND REUSABLE (DRAPES) ×2 IMPLANT
GAUZE SPONGE 4X4 12PLY STRL (GAUZE/BANDAGES/DRESSINGS) ×2 IMPLANT
GLOVE SURG UNDER POLY LF SZ7.5 (GLOVE) ×2 IMPLANT
INST BIOPSY MAXCORE 18GX25 (NEEDLE) ×2 IMPLANT
KIT TURNOVER CYSTO (KITS) ×2 IMPLANT
SURGILUBE 2OZ TUBE FLIPTOP (MISCELLANEOUS) ×2 IMPLANT
WATER STERILE IRR 500ML POUR (IV SOLUTION) ×2 IMPLANT

## 2021-11-04 NOTE — Anesthesia Preprocedure Evaluation (Signed)
Anesthesia Evaluation  Patient identified by MRN, date of birth, ID band Patient awake    Reviewed: Allergy & Precautions, NPO status , Patient's Chart, lab work & pertinent test results  History of Anesthesia Complications Negative for: history of anesthetic complications  Airway Mallampati: II  TM Distance: >3 FB Neck ROM: Full    Dental  (+) Chipped   Pulmonary neg pulmonary ROS, neg sleep apnea, neg COPD, Patient abstained from smoking.Not current smoker, former smoker,  Chews tobacco, none this AM   Pulmonary exam normal breath sounds clear to auscultation       Cardiovascular Exercise Tolerance: Good METShypertension, Pt. on medications (-) CAD and (-) Past MI (-) dysrhythmias  Rhythm:Regular Rate:Normal - Systolic murmurs    Neuro/Psych negative neurological ROS  negative psych ROS   GI/Hepatic PUD, GERD  Controlled and Medicated,(+)     substance abuse  alcohol use, Drinks 8-10 beers a night   Endo/Other  neg diabetes  Renal/GU negative Renal ROS     Musculoskeletal   Abdominal   Peds  Hematology   Anesthesia Other Findings Past Medical History: No date: Anemia No date: Elevated PSA No date: Erectile dysfunction No date: Hyperlipidemia No date: Hypertension No date: Sciatica     Comment:  right  Reproductive/Obstetrics                             Anesthesia Physical Anesthesia Plan  ASA: 2  Anesthesia Plan: General   Post-op Pain Management: Minimal or no pain anticipated   Induction: Intravenous  PONV Risk Score and Plan: 3 and Propofol infusion, TIVA, Ondansetron and Midazolam  Airway Management Planned: Nasal Cannula  Additional Equipment: None  Intra-op Plan:   Post-operative Plan:   Informed Consent: I have reviewed the patients History and Physical, chart, labs and discussed the procedure including the risks, benefits and alternatives for the proposed  anesthesia with the patient or authorized representative who has indicated his/her understanding and acceptance.     Dental advisory given  Plan Discussed with: CRNA and Surgeon  Anesthesia Plan Comments: (Discussed risks of anesthesia with patient, including possibility of difficulty with spontaneous ventilation under anesthesia necessitating airway intervention, PONV, and rare risks such as cardiac or respiratory or neurological events, and allergic reactions. Discussed the role of CRNA in patient's perioperative care. Patient understands.)        Anesthesia Quick Evaluation

## 2021-11-04 NOTE — Op Note (Signed)
Preoperative diagnosis:  Low risk prostate cancer Rising PSA  Postoperative diagnosis:  Same  Procedure: Transrectal ultrasound prostate Transrectal prostate biopsies  Surgeon: [name], MD  Anesthesia: MAC  Complications: None  Intraoperative findings:  DRE firm left prostate TRUS: No peripheral zone echogenic abnormalities.  Prostate volume 45 cc  EBL: Minimal  Specimens: Standard 12 core prostate biopsies  Indication: Jason Holmes is a 71 y.o. with low risk prostate cancer.  Prostate MRI was performed for a rising PSA which showed no lesions suspicious for high-grade cancer.  Recent PSA was 15 and firm left prostate noted on DRE which was not previously identified.  He presents for follow-up/confirmatory biopsy.  After reviewing the management options for treatment, he elected to proceed with the above surgical procedure(s). We have discussed the potential benefits and risks of the procedure, side effects of the proposed treatment, the likelihood of the patient achieving the goals of the procedure, and any potential problems that might occur during the procedure or recuperation. Informed consent has been obtained.  Description of procedure:  The patient was taken to the operating room.  He remained on the stretcher and was not transferred to the OR table.  The patient was placed in the left lateral decubitus position with knees to chest.  IV sedation was obtained by anesthesia and preoperative antibiotics were administered. A preoperative time-out was performed.   Digital rectal exam was performed and the left prostate was diffusely firm.  A transrectal ultrasound probe with biopsy guide was lubricated and gently placed per rectum.  Ultrasound was performed with findings as described above.  Standard 12 core biopsies were then performed in the usual fashion.  No significant bleeding was noted.  He was then placed back in the supine position and transported to the PACU in  stable condition.  Plan: He will be contacted with the pathology results and further recommendations at that time   [name], M.D.

## 2021-11-04 NOTE — Anesthesia Postprocedure Evaluation (Signed)
Anesthesia Post Note  Patient: Jason Holmes  Procedure(s) Performed: PROSTATE BIOPSY (Prostate) TRANSRECTAL ULTRASOUND (Prostate)  Patient location during evaluation: PACU Anesthesia Type: General Level of consciousness: awake and alert Pain management: pain level controlled Vital Signs Assessment: post-procedure vital signs reviewed and stable Respiratory status: spontaneous breathing, nonlabored ventilation, respiratory function stable and patient connected to nasal cannula oxygen Cardiovascular status: blood pressure returned to baseline and stable Postop Assessment: no apparent nausea or vomiting Anesthetic complications: no   No notable events documented.   Last Vitals:  Vitals:   11/04/21 0826 11/04/21 0834  BP: 127/86 139/83  Pulse: (!) 57 66  Resp: 15 16  Temp: (!) 36.2 C (!) 36.1 C  SpO2: 97% 100%    Last Pain:  Vitals:   11/04/21 0834  TempSrc: Temporal  PainSc: 0-No pain                 Arita Miss

## 2021-11-04 NOTE — Discharge Instructions (Addendum)
Post prostate biopsy instructions No driving, operating machinery or strenuous activity for 24 hours.  May resume regular activities after 24 hours May resume previous medications Blood from the rectum and urine is normal.  Call for excessive bleeding from the rectum or urine Blood in the semen is common and can persist for up to 6 weeks Call for any urinary problems or fever greater than [facility] You will be contacted with your pathology results  [place]   The drugs that you were given will stay in your system until tomorrow so for the next 24 hours you should not:  Drive an automobile Make any legal decisions Drink any alcoholic beverage   You may resume regular meals tomorrow.  Today it is better to start with liquids and gradually work up to solid foods.  You may eat anything you prefer, but it is better to start with liquids, then soup and crackers, and gradually work up to solid foods.   Please notify your doctor immediately if you have any unusual bleeding, trouble breathing, redness and pain at the surgery site, drainage, fever, or pain not relieved by medication.    Additional Instructions:        Please contact your physician with any problems or Same Day Surgery at [phone], Monday through Friday 6 am to 4 pm, or [place] at [facility] number at [phone].

## 2021-11-04 NOTE — Transfer of Care (Signed)
Immediate Anesthesia Transfer of Care Note  Patient: Jason Holmes  Procedure(s) Performed: PROSTATE BIOPSY (Prostate) TRANSRECTAL ULTRASOUND (Prostate)  Patient Location: PACU  Anesthesia Type:MAC  Level of Consciousness: drowsy  Airway & Oxygen Therapy: Patient Spontanous Breathing and Patient connected to face mask oxygen  Post-op Assessment: Report given to RN and Post -op Vital signs reviewed and stable  Post vital signs: Reviewed and stable  Last Vitals:  Vitals Value Taken Time  BP    Temp    Pulse    Resp    SpO2      Last Pain:  Vitals:   11/04/21 0617  TempSrc: Temporal  PainSc: 0-No pain         Complications: No notable events documented.

## 2021-11-04 NOTE — Interval H&P Note (Signed)
History and Physical Interval Note:  No changes since last office visit.  All questions were answered and he desires to proceed.  CV: RRR Lungs: Clear  11/04/2021 7:10 AM  Jason Holmes  has presented today for surgery, with the diagnosis of Prostate Cancer.  The various methods of treatment have been discussed with the patient and family. After consideration of risks, benefits and other options for treatment, the patient has consented to  Procedure(s): PROSTATE BIOPSY (N/A) TRANSRECTAL ULTRASOUND (N/A) as a surgical intervention.  The patient's history has been reviewed, patient examined, no change in status, stable for surgery.  I have reviewed the patient's chart and labs.  Questions were answered to the patient's satisfaction.     [place]

## 2021-11-06 LAB — SURGICAL PATHOLOGY

## 2021-11-10 ENCOUNTER — Telehealth: Payer: Self-pay | Admitting: Urology

## 2021-11-10 ENCOUNTER — Encounter: Payer: Self-pay | Admitting: Family Medicine

## 2021-11-10 ENCOUNTER — Ambulatory Visit (INDEPENDENT_AMBULATORY_CARE_PROVIDER_SITE_OTHER): Payer: Medicare HMO | Admitting: Family Medicine

## 2021-11-10 VITALS — BP 134/71 | HR 77 | Temp 98.8°F | Resp 14 | Wt 174.0 lb

## 2021-11-10 DIAGNOSIS — R7989 Other specified abnormal findings of blood chemistry: Secondary | ICD-10-CM | POA: Insufficient documentation

## 2021-11-10 DIAGNOSIS — I10 Essential (primary) hypertension: Secondary | ICD-10-CM

## 2021-11-10 DIAGNOSIS — C61 Malignant neoplasm of prostate: Secondary | ICD-10-CM

## 2021-11-10 DIAGNOSIS — E785 Hyperlipidemia, unspecified: Secondary | ICD-10-CM

## 2021-11-10 NOTE — Progress Notes (Signed)
I,Roshena L Chambers,acting as a scribe for Lelon Huh, MD.,have documented all relevant documentation on the behalf of Lelon Huh, MD,as directed by  Lelon Huh, MD while in the presence of Lelon Huh, MD.   Established patient visit   Patient: Jason Holmes   DOB: 04-28-1951   71 y.o. Male  MRN: [id number] Visit Date: 11/10/2021  Today's healthcare provider: Lelon Huh, MD   Chief Complaint  Patient presents with   Hyperlipidemia   Hypertension   Subjective    HPI  Lipid/Cholesterol, Follow-up  Last lipid panel Other pertinent labs  Lab Results  Component Value Date   CHOL 219 (H) 09/12/2020   HDL 86 09/12/2020   LDLCALC 116 (H) 09/12/2020   TRIG 99 09/12/2020   CHOLHDL 2.5 09/12/2020   Lab Results  Component Value Date   ALT 21 09/12/2020   AST 29 09/12/2020   PLT 305 11/03/2021   TSH 4.520 (H) 09/12/2020     He was last seen for this 1  year  ago.  Management since that visit includes advising patient to cut back on saturated fats.  He reports fair compliance with treatment. Patient follows a vegetarian diet, although he reports consuming large amounts cheese. He is not having side effects.   Symptoms: No chest pain No chest pressure/discomfort  No dyspnea No lower extremity edema  No numbness or tingling of extremity No orthopnea  No palpitations No paroxysmal nocturnal dyspnea  No speech difficulty No syncope   Current diet: vegetarian Current exercise: walking  The 10-year ASCVD risk score (Arnett DK, et al., 2019) is: 21.6%  ---------------------------------------------------------------------------------------------------   Hypertension, follow-up  BP Readings from Last 3 Encounters:  11/10/21 134/71  11/04/21 139/83  11/03/21 (!) 152/84   Wt Readings from Last 3 Encounters:  11/10/21 174 lb (78.9 kg)  11/04/21 172 lb (78 kg)  11/03/21 176 lb 1.6 oz (79.9 kg)     He was last seen for hypertension 1  year  ago.  BP at  that visit was 130/80. Management since that visit includes continuing same medication.  He reports good compliance with treatment. He is not having side effects.  He is following a Regular diet. He is exercising. He does not smoke.  Use of agents associated with hypertension: NSAIDS.   Outside blood pressures are 124/72. Symptoms: No chest pain No chest pressure  No palpitations No syncope  No dyspnea No orthopnea  No paroxysmal nocturnal dyspnea No lower extremity edema   Pertinent labs Lab Results  Component Value Date   CHOL 219 (H) 09/12/2020   HDL 86 09/12/2020   LDLCALC 116 (H) 09/12/2020   TRIG 99 09/12/2020   CHOLHDL 2.5 09/12/2020   Lab Results  Component Value Date   NA 135 11/03/2021   K 3.7 11/03/2021   CREATININE 0.72 11/03/2021   GFRNONAA >60 11/03/2021   GLUCOSE 91 11/03/2021   TSH 4.520 (H) 09/12/2020     The 10-year ASCVD risk score (Arnett DK, et al., 2019) is: 21.6%  ---------------------------------------------------------------------------------------------------   Medications: Outpatient Medications Prior to Visit  Medication Sig   aspirin 81 MG tablet Take 81 mg by mouth at bedtime.    Camphor-Menthol-Methyl Sal (SALONPAS) 3.05-30-08 % PTCH Apply 1 patch topically daily as needed (Back pain).   diphenhydrAMINE (BENADRYL) 25 MG tablet Take 25 mg by mouth at bedtime.    Glucosamine Sulfate (GNP GLUCOSAME MAXIMUM STRENGTH) 1000 MG TABS Take 1,500 mg by mouth at bedtime.  hydrochlorothiazide (HYDRODIURIL) 25 MG tablet TAKE 1 TABLET DAILY   ibuprofen (ADVIL) 200 MG tablet Take 400 mg by mouth every 6 (six) hours as needed.   Multiple Vitamin tablet Take 1 tablet by mouth daily. Silver   omeprazole (PRILOSEC) 40 MG capsule TAKE 1 CAPSULE DAILY   OVER THE COUNTER MEDICATION Apply 1 application topically daily as needed (pain). leg and back pain relief  CBD cream   sildenafil (VIAGRA) 100 MG tablet Take 1 tablet (100 mg total) by mouth daily as  needed for erectile dysfunction.   simvastatin (ZOCOR) 20 MG tablet Take 1 tablet (20 mg total) by mouth at bedtime.   Turmeric 500 MG CAPS Take 500 mg by mouth daily.   No facility-administered medications prior to visit.    Review of Systems  Constitutional:  Negative for appetite change, chills and fever.  Respiratory:  Negative for chest tightness, shortness of breath and wheezing.   Cardiovascular:  Negative for chest pain and palpitations.  Gastrointestinal:  Negative for abdominal pain, nausea and vomiting.       Objective    BP 134/71 (BP Location: Left Arm, Patient Position: Sitting)   Pulse 77   Temp 98.8 F (37.1 C) (Oral)   Resp 14   Wt 174 lb (78.9 kg)   SpO2 98% Comment: room air  BMI 28.08 kg/m    Physical Exam  General appearance:  Well developed, well nourished male, cooperative and in no acute distress Head: Normocephalic, without obvious abnormality, atraumatic Respiratory: Respirations even and unlabored, normal respiratory rate Extremities: All extremities are intact.  Skin: Skin color, texture, turgor normal. No rashes seen  Psych: Appropriate mood and affect. Neurologic: Mental status: Alert, oriented to person, place, and time, thought content appropriate.   Assessment & Plan     1. Essential (primary) hypertension Well controlled.  Continue current medications.    2. Elevated TSH - TSH  3. Hyperlipidemia, unspecified hyperlipidemia type  - Lipid panel      The entirety of the information documented in the History of Present Illness, Review of Systems and Physical Exam were personally obtained by me. Portions of this information were initially documented by the CMA and reviewed by me for thoroughness and accuracy.     Lelon Huh, MD  [facility] [phone] (phone) [phone] (fax)  [place]

## 2021-11-10 NOTE — Telephone Encounter (Signed)
I contacted Jason Holmes on Thursday, 11/06/2021 to discuss his prostate biopsy report.  He had no postbiopsy complaints. 1/12 cores were positive for prostate cancer however the core was Gleason 4+3 (25% tissue involvement with 70% grade 4 pattern).  We discussed this would place him from low risk to intermediate risk unfavorable and treatment would be recommended.  I recommended a PSMA/PET scan and if no evidence of metastatic disease the most common curative treatment options were discussed including robotic assisted laparoscopic radical prostatectomy and radiation modalities.  He is more interested in radiation.  He will be contacted with his staging evaluation and radiation oncology referral if no evidence of metastatic disease.

## 2021-11-13 ENCOUNTER — Ambulatory Visit: Payer: Medicare HMO | Admitting: Urology

## 2021-11-19 DIAGNOSIS — I10 Essential (primary) hypertension: Secondary | ICD-10-CM | POA: Diagnosis not present

## 2021-11-20 ENCOUNTER — Encounter
Admission: RE | Admit: 2021-11-20 | Discharge: 2021-11-20 | Disposition: A | Discharge status: Home / Self Care | Payer: Medicare HMO | Source: Ambulatory Visit | Attending: Urology | Admitting: Urology

## 2021-11-20 DIAGNOSIS — C61 Malignant neoplasm of prostate: Secondary | ICD-10-CM | POA: Insufficient documentation

## 2021-11-20 LAB — TSH: TSH: 3.73 u[IU]/mL (ref 0.450–4.500)

## 2021-11-20 LAB — LIPID PANEL
Chol/HDL Ratio: 2.3 ratio (ref 0.0–5.0)
Cholesterol, Total: 222 mg/dL — ABNORMAL HIGH (ref 100–199)
HDL: 98 mg/dL (ref >39)
LDL Chol Calc (NIH): 112 mg/dL — ABNORMAL HIGH (ref 0–99)
Triglycerides: 71 mg/dL (ref 0–149)
VLDL Cholesterol Cal: 12 mg/dL (ref 5–40)

## 2021-11-20 MED ORDER — PIFLIFOLASTAT F 18 (PYLARIFY) INJECTION
9.0000 | Freq: Once | INTRAVENOUS | Status: AC
  Administered 2021-11-20: 9.79 via INTRAVENOUS

## 2021-11-21 ENCOUNTER — Encounter: Payer: Self-pay | Admitting: Urology

## 2021-11-27 ENCOUNTER — Other Ambulatory Visit: Payer: Self-pay | Admitting: Urology

## 2021-11-27 DIAGNOSIS — C61 Malignant neoplasm of prostate: Secondary | ICD-10-CM

## 2021-12-02 ENCOUNTER — Ambulatory Visit
Admission: RE | Admit: 2021-12-02 | Discharge: 2021-12-02 | Disposition: A | Discharge status: Home / Self Care | Payer: Medicare HMO | Source: Ambulatory Visit | Attending: Radiation Oncology | Admitting: Radiation Oncology

## 2021-12-02 ENCOUNTER — Encounter: Payer: Self-pay | Admitting: Radiation Oncology

## 2021-12-02 VITALS — BP 123/77 | HR 68 | Temp 98.0°F | Resp 18 | Ht 66.0 in | Wt 174.0 lb

## 2021-12-02 DIAGNOSIS — C61 Malignant neoplasm of prostate: Secondary | ICD-10-CM

## 2021-12-02 NOTE — Consult Note (Signed)
NEW PATIENT EVALUATION  Name: Jason Holmes  MRN: [id number]  Date:   12/02/2021     DOB: August 09, 1950   This 71 y.o. male patient presents to the clinic for initial evaluation of stage IIc (cT2 aN0 M0) Gleason 7 (4+3) adenocarcinoma the prostate presenting with a PSA in the 15 range.  REFERRING PHYSICIAN: Birdie Sons, MD  CHIEF COMPLAINT:  Chief Complaint  Patient presents with   Prostate Cancer    DIAGNOSIS: There were no encounter diagnoses.   PREVIOUS INVESTIGATIONS:  PET CT scan reviewed Pathology reports reviewed Clinical notes reviewed  HPI: Patient is a 71 year old male who has been followed since April 2018 for rising PSA in the 6.8 range.  His prostate volume was approximately 44.5.  He had on biopsies some high-grade PIN.  Prostate MRI in 2019 was consistent with BPH no suspicious lesions.  He had 3 of 12 cores positive back in 2021 when his PSA reached 10.9 showing Gleason 6 adenocarcinoma.  He elected active surveillance.  Recently his PSA has climbed to 15 PET PSA showed a single focus of radiotracer tracer activity in the left lobe of the prostate.  There is no evidence of metastatic adenopathy in the pelvis or periotic retroperitoneum no evidence of visceral or skeletal metastasis.  He underwent repeat biopsy with 1 core showing Gleason 7 (4+3).  He is elected for radiation collagen evaluation is seen today for opinion he is doing well has nocturia x1-2 no urgency or frequency no specific bowel problems.  PLANNED TREATMENT REGIMEN: I-125 interstitial implant  PAST MEDICAL HISTORY:  has a past medical history of Anemia, Elevated PSA, Erectile dysfunction, Hyperlipidemia, Hypertension, and Sciatica.    PAST SURGICAL HISTORY:  Past Surgical History:  Procedure Laterality Date   COLONOSCOPY N/A 02/24/2017   Procedure: COLONOSCOPY;  Surgeon: Lin Landsman, MD;  Location: [place];  Service: Endoscopy;  Laterality: N/A;   NASAL FRACTURE SURGERY  2005    PROSTATE BIOPSY     PROSTATE BIOPSY N/A 03/05/2020   Procedure: BIOPSY TRANSRECTAL ULTRASONIC PROSTATE (TUBP);  Surgeon: [name], MD;  Location: [facility];  Service: Urology;  Laterality: N/A;   PROSTATE BIOPSY N/A 11/04/2021   Procedure: PROSTATE BIOPSY;  Surgeon: [name], MD;  Location: [facility];  Service: Urology;  Laterality: N/A;   TONSILLECTOMY  1972   TRANSRECTAL ULTRASOUND N/A 11/04/2021   Procedure: TRANSRECTAL ULTRASOUND;  Surgeon: [name], MD;  Location: [facility];  Service: Urology;  Laterality: N/A;    FAMILY HISTORY: family history includes Heart attack in his father and mother.  SOCIAL HISTORY:  reports that he quit smoking about 18 years ago. His smoking use included cigarettes. His smokeless tobacco use includes chew. He reports current alcohol use of about 70.0 standard drinks of alcohol per week. He reports that he does not use drugs.  ALLERGIES: Lovastatin  MEDICATIONS:  Current Outpatient Medications  Medication Sig Dispense Refill   aspirin 81 MG tablet Take 81 mg by mouth at bedtime.      Camphor-Menthol-Methyl Sal (SALONPAS) 3.05-30-08 % PTCH Apply 1 patch topically daily as needed (Back pain).     diphenhydrAMINE (BENADRYL) 25 MG tablet Take 25 mg by mouth at bedtime.      Glucosamine Sulfate (GNP GLUCOSAME MAXIMUM STRENGTH) 1000 MG TABS Take 1,500 mg by mouth at bedtime.      hydrochlorothiazide (HYDRODIURIL) 25 MG tablet TAKE 1 TABLET DAILY 90 tablet 0   ibuprofen (ADVIL) 200 MG tablet Take  400 mg by mouth every 6 (six) hours as needed.     Multiple Vitamin tablet Take 1 tablet by mouth daily. Silver     omeprazole (PRILOSEC) 40 MG capsule TAKE 1 CAPSULE DAILY 90 capsule 1   OVER THE COUNTER MEDICATION Apply 1 application topically daily as needed (pain). leg and back pain relief  CBD cream     sildenafil (VIAGRA) 100 MG tablet Take 1 tablet (100 mg total) by mouth daily as needed for erectile dysfunction. 90 tablet 3   simvastatin  (ZOCOR) 20 MG tablet Take 1 tablet (20 mg total) by mouth at bedtime. 90 tablet 0   Turmeric 500 MG CAPS Take 500 mg by mouth daily.     No current facility-administered medications for this encounter.    ECOG PERFORMANCE STATUS:  0 - Asymptomatic  REVIEW OF SYSTEMS: Patient denies any weight loss, fatigue, weakness, fever, chills or night sweats. Patient denies any loss of vision, blurred vision. Patient denies any ringing  of the ears or hearing loss. No irregular heartbeat. Patient denies heart murmur or history of fainting. Patient denies any chest pain or pain radiating to her upper extremities. Patient denies any shortness of breath, difficulty breathing at night, cough or hemoptysis. Patient denies any swelling in the lower legs. Patient denies any nausea vomiting, vomiting of blood, or coffee ground material in the vomitus. Patient denies any stomach pain. Patient states has had normal bowel movements no significant constipation or diarrhea. Patient denies any dysuria, hematuria or significant nocturia. Patient denies any problems walking, swelling in the joints or loss of balance. Patient denies any skin changes, loss of hair or loss of weight. Patient denies any excessive worrying or anxiety or significant depression. Patient denies any problems with insomnia. Patient denies excessive thirst, polyuria, polydipsia. Patient denies any swollen glands, patient denies easy bruising or easy bleeding. Patient denies any recent infections, allergies or URI. Patient "s visual fields have not changed significantly in recent time.   PHYSICAL EXAM: BP 123/77 (BP Location: Right Arm, Patient Position: Sitting, Cuff Size: Normal)   Pulse 68   Temp 98 F (36.7 C) (Tympanic)   Resp 18   Ht '5\' 6"'$  (1.676 m) Comment: stated Ht  Wt 174 lb (78.9 kg)   BMI 28.08 kg/m  Well-developed well-nourished patient in NAD. HEENT reveals PERLA, EOMI, discs not visualized.  Oral cavity is clear. No oral mucosal lesions  are identified. Neck is clear without evidence of cervical or supraclavicular adenopathy. Lungs are clear to A&P. Cardiac examination is essentially unremarkable with regular rate and rhythm without murmur rub or thrill. Abdomen is benign with no organomegaly or masses noted. Motor sensory and DTR levels are equal and symmetric in the upper and lower extremities. Cranial nerves II through XII are grossly intact. Proprioception is intact. No peripheral adenopathy or edema is identified. No motor or sensory levels are noted. Crude visual fields are within normal range.  LABORATORY DATA: Pathology reports reviewed    RADIOLOGY RESULTS: PET CT scan reviewed previous MRI scans reviewed   IMPRESSION: Stage IIc Gleason 7 (4+3) adenocarcinoma the prostate presenting with a PSA of 15 in healthy 71 year old male  PLAN: At this time we have gone over treatment options including IMRT external beam radiation therapy as well as I-125 interstitial implant.  Risks and benefits of both procedures was reviewed.  Patient is opted for I-125 interstitial implant.  We are also contacting Dr. Clydene Laming office for 1 shot of 31-monthdepot of Eligard.  We will also arrange volume study and implant with him.  Risks and benefits of treatment including radiation safety precautions increased lower urinary tract symptoms possible increased bowel diarrhea fatigue all were reviewed with the patient and his wife.  They comprehend my treatment plan well.  I would like to take this opportunity to thank you for allowing me to participate in the care of your patient.Noreene Filbert, MD

## 2021-12-03 ENCOUNTER — Other Ambulatory Visit: Payer: Self-pay

## 2021-12-03 DIAGNOSIS — C61 Malignant neoplasm of prostate: Secondary | ICD-10-CM

## 2021-12-03 NOTE — Progress Notes (Signed)
[place] [facility] Posting Form   Surgery Date/Time: Date: 01/27/2022  Surgeon: Dr. John Giovanni, MD  Surgery Location: Day Surgery  Inpt ( No  )   Outpt (Yes)   Obs ( No  )   Diagnosis: C61 Prostate Cancer  -CPT: 92924, 46286, [phone]  Surgery: Radioactive Seed Implant/ Brachytherapy  *Orders entered into EPIC  Date: 12/03/21   *Case booked in EPIC  Date: 12/03/21  *Notified pt of Surgery: Date: 12/03/21  PREOP UA AND CX: yes, will obtain at [place] into Prior Authorization Work Que Date: 12/03/21   Assistant/laser/rep:No

## 2021-12-05 ENCOUNTER — Telehealth: Payer: Self-pay

## 2021-12-05 NOTE — Telephone Encounter (Signed)
[employer] pre certification request for Eligard faxed, awaiting response.

## 2021-12-05 NOTE — Telephone Encounter (Signed)
-----   Message from Christean Grief, RN sent at 12/02/2021  2:14 PM EDT ----- Regarding: Eliguard This patient will need to get eliguard injections.  He is a patient of Dr. Bernardo Heater.   Thanks,  Ranelle Oyster

## 2021-12-15 ENCOUNTER — Other Ambulatory Visit: Payer: Self-pay | Admitting: Family Medicine

## 2021-12-25 DIAGNOSIS — Z191 Hormone sensitive malignancy status: Secondary | ICD-10-CM | POA: Diagnosis not present

## 2021-12-25 DIAGNOSIS — C61 Malignant neoplasm of prostate: Secondary | ICD-10-CM | POA: Diagnosis not present

## 2021-12-26 NOTE — Telephone Encounter (Signed)
Incoming approval from [employer]:  Dates: 12/05/21 - 06/07/22  Patient scheduled.

## 2021-12-31 ENCOUNTER — Encounter: Payer: Self-pay | Admitting: Certified Registered Nurse Anesthetist

## 2021-12-31 ENCOUNTER — Ambulatory Visit: Payer: Medicare HMO | Admitting: Physician Assistant

## 2021-12-31 ENCOUNTER — Ambulatory Visit: Admission: RE | Admit: 2021-12-31 | Payer: Medicare HMO | Source: Home / Self Care

## 2021-12-31 ENCOUNTER — Ambulatory Visit
Admission: RE | Admit: 2021-12-31 | Discharge: 2021-12-31 | Disposition: A | Discharge status: Home / Self Care | Payer: Medicare HMO | Source: Ambulatory Visit | Attending: Radiation Oncology | Admitting: Radiation Oncology

## 2021-12-31 VITALS — BP 148/82 | HR 60 | Temp 97.5°F | Resp 16 | Wt 172.6 lb

## 2021-12-31 DIAGNOSIS — C61 Malignant neoplasm of prostate: Secondary | ICD-10-CM | POA: Insufficient documentation

## 2021-12-31 DIAGNOSIS — Z191 Hormone sensitive malignancy status: Secondary | ICD-10-CM | POA: Diagnosis not present

## 2021-12-31 SURGERY — ULTRASOUND, PROSTATE, FOR VOLUME DETERMINATION
Anesthesia: Choice

## 2021-12-31 NOTE — Progress Notes (Signed)
Radiation Oncology Volume study note  Name: Jason Holmes   Date:   12/31/2021 MRN:  [id number] DOB: [birth year]    This 71 y.o. male presents to the OR for volume study in anticipation for I-125 interstitial implant for Gleason 7 adenocarcinoma the prostate.  REFERRING PROVIDER: Birdie Sons, MD  HPI: Patient is a 71 year old male originally presented with rising PSA up to 6.8.  He was diagnosed in 2021 with 3 of 12 cores positive for Gleason 6 adenocarcinoma and elected active surveillance.  Recently his PSA climbed to 15 PET showed a single focus of radiotracer activity in the left lobe of the prostate no evidence of metastatic adenopathy in the pelvis or retroperitoneum.  Repeat biopsy showed Gleason 7 (4+3).  He opted for I-125 interstitial implant is here today for his volume study continues to do well very low side effect profile.  COMPLICATIONS OF TREATMENT: none  FOLLOW UP COMPLIANCE: keeps appointments   PHYSICAL EXAM:  BP (!) 148/82 (BP Location: Left Arm, Patient Position: Sitting, Cuff Size: Normal)   Pulse 60   Temp (!) 97.5 F (36.4 C) (Tympanic)   Resp 16   Wt 172 lb 9.6 oz (78.3 kg)   BMI 27.86 kg/m  Well-developed well-nourished patient in NAD. HEENT reveals PERLA, EOMI, discs not visualized.  Oral cavity is clear. No oral mucosal lesions are identified. Neck is clear without evidence of cervical or supraclavicular adenopathy. Lungs are clear to A&P. Cardiac examination is essentially unremarkable with regular rate and rhythm without murmur rub or thrill. Abdomen is benign with no organomegaly or masses noted. Motor sensory and DTR levels are equal and symmetric in the upper and lower extremities. Cranial nerves II through XII are grossly intact. Proprioception is intact. No peripheral adenopathy or edema is identified. No motor or sensory levels are noted. Crude visual fields are within normal range.  RADIOLOGY RESULTS: Ultrasound used for volume study  PLAN:  Patient was taken to the cystoscopy suite in the OR. Patient was placed in the low lithotomy position. Foley catheter was placed. Trans-rectal ultrasound probe was inserted into the rectum and prostate seminal vesicles were visualized as well as bladder base. stepping images were performed on a 5 mm increments. Images will be placed in Brachy[employer] treatment planning system to determine seed placement coordinates for eventual I-125 interstitial implant. Images will be reviewed with the physics and dosimetry staff for final quality approval. I personally was present for the volume study and assisted in delineation of contour volumes.  At the end of the procedure Foley catheter was removed, rectal ultrasound probe was removed. Patient tolerated his procedures extremely well with no side effects or complaints. Patient has given appointment for interstitial implant date. Consent was signed today as well as history and physical performed in preparation for his outpatient surgical implant.     Noreene Filbert, MD

## 2021-12-31 NOTE — H&P (Signed)
NEW PATIENT EVALUATION  Name: Jason Holmes  MRN: [id number]  Date:   12/31/2021     DOB: 14-Jul-1950   This 71 y.o. male patient presents to the clinic for i history and physical anticipation of an I-125 interstitial implant work stage IIc adenocarcinoma the prostate REFERRING PHYSICIAN: Birdie Sons, MD  CHIEF COMPLAINT: No chief complaint on file.   DIAGNOSIS: There were no encounter diagnoses.   PREVIOUS INVESTIGATIONS:  Clinical notes reviewed Pathology reports reviewed Volume study performed  HPI: Patient is a 71 year old male originally presented with rising PSA up to 6.8.  He was diagnosed in 2021 with 3 of 12 cores positive for Gleason 6 adenocarcinoma and elected active surveillance.  Recently his PSA climbed to 15 PET showed a single focus of radiotracer activity in the left lobe of the prostate no evidence of metastatic adenopathy in the pelvis or retroperitoneum.  Repeat biopsy showed Gleason 7 (4+3).  He opted for I-125 interstitial implant is here today for his volume study continues to do well very low side effect profile.  PLANNED TREATMENT REGIMEN: I-125 interstitial implant  PAST MEDICAL HISTORY:  has a past medical history of Anemia, Elevated PSA, Erectile dysfunction, Hyperlipidemia, Hypertension, and Sciatica.    PAST SURGICAL HISTORY:  Past Surgical History:  Procedure Laterality Date   COLONOSCOPY N/A 02/24/2017   Procedure: COLONOSCOPY;  Surgeon: Lin Landsman, MD;  Location: [facility];  Service: Endoscopy;  Laterality: N/A;   NASAL FRACTURE SURGERY  2005   PROSTATE BIOPSY     PROSTATE BIOPSY N/A 03/05/2020   Procedure: BIOPSY TRANSRECTAL ULTRASONIC PROSTATE (TUBP);  Surgeon: [name], MD;  Location: [facility];  Service: Urology;  Laterality: N/A;   PROSTATE BIOPSY N/A 11/04/2021   Procedure: PROSTATE BIOPSY;  Surgeon: [name], MD;  Location: [facility];  Service: Urology;  Laterality: N/A;   TONSILLECTOMY  1972    TRANSRECTAL ULTRASOUND N/A 11/04/2021   Procedure: TRANSRECTAL ULTRASOUND;  Surgeon: [name], MD;  Location: [facility];  Service: Urology;  Laterality: N/A;    FAMILY HISTORY: family history includes Heart attack in his father and mother.  SOCIAL HISTORY:  reports that he quit smoking about 18 years ago. His smoking use included cigarettes. His smokeless tobacco use includes chew. He reports current alcohol use of about 70.0 standard drinks of alcohol per week. He reports that he does not use drugs.  ALLERGIES: Lovastatin  MEDICATIONS:  Current Outpatient Medications  Medication Sig Dispense Refill   aspirin 81 MG tablet Take 81 mg by mouth at bedtime.      Camphor-Menthol-Methyl Sal (SALONPAS) 3.05-30-08 % PTCH Apply 1 patch topically daily as needed (Back pain).     diphenhydrAMINE (BENADRYL) 25 MG tablet Take 25 mg by mouth at bedtime.      Glucosamine Sulfate (GNP GLUCOSAME MAXIMUM STRENGTH) 1000 MG TABS Take 1,500 mg by mouth at bedtime.      hydrochlorothiazide (HYDRODIURIL) 25 MG tablet TAKE 1 TABLET DAILY 90 tablet 0   ibuprofen (ADVIL) 200 MG tablet Take 400 mg by mouth every 6 (six) hours as needed.     Multiple Vitamin tablet Take 1 tablet by mouth daily. Silver     omeprazole (PRILOSEC) 40 MG capsule TAKE 1 CAPSULE DAILY 90 capsule 1   OVER THE COUNTER MEDICATION Apply 1 application topically daily as needed (pain). leg and back pain relief  CBD cream     sildenafil (VIAGRA) 100 MG tablet Take 1 tablet (100 mg total)  by mouth daily as needed for erectile dysfunction. 90 tablet 3   simvastatin (ZOCOR) 20 MG tablet Take 1 tablet (20 mg total) by mouth at bedtime. 90 tablet 0   Turmeric 500 MG CAPS Take 500 mg by mouth daily.     No current facility-administered medications for this encounter.    ECOG PERFORMANCE STATUS:  0 - Asymptomatic  REVIEW OF SYSTEMS: Patient denies any weight loss, fatigue, weakness, fever, chills or night sweats. Patient denies any loss of  vision, blurred vision. Patient denies any ringing  of the ears or hearing loss. No irregular heartbeat. Patient denies heart murmur or history of fainting. Patient denies any chest pain or pain radiating to her upper extremities. Patient denies any shortness of breath, difficulty breathing at night, cough or hemoptysis. Patient denies any swelling in the lower legs. Patient denies any nausea vomiting, vomiting of blood, or coffee ground material in the vomitus. Patient denies any stomach pain. Patient states has had normal bowel movements no significant constipation or diarrhea. Patient denies any dysuria, hematuria or significant nocturia. Patient denies any problems walking, swelling in the joints or loss of balance. Patient denies any skin changes, loss of hair or loss of weight. Patient denies any excessive worrying or anxiety or significant depression. Patient denies any problems with insomnia. Patient denies excessive thirst, polyuria, polydipsia. Patient denies any swollen glands, patient denies easy bruising or easy bleeding. Patient denies any recent infections, allergies or URI. Patient "s visual fields have not changed significantly in recent time.   PHYSICAL EXAM: There were no vitals taken for this visit. Well-developed well-nourished patient in NAD. HEENT reveals PERLA, EOMI, discs not visualized.  Oral cavity is clear. No oral mucosal lesions are identified. Neck is clear without evidence of cervical or supraclavicular adenopathy. Lungs are clear to A&P. Cardiac examination is essentially unremarkable with regular rate and rhythm without murmur rub or thrill. Abdomen is benign with no organomegaly or masses noted. Motor sensory and DTR levels are equal and symmetric in the upper and lower extremities. Cranial nerves II through XII are grossly intact. Proprioception is intact. No peripheral adenopathy or edema is identified. No motor or sensory levels are noted. Crude visual fields are within  normal range.  LABORATORY DATA: Labs reviewed    RADIOLOGY RESULTS: Volume study performed   IMPRESSION: Stage IIc adenocarcinoma the prostate Gleason 7 (4+3) presenting with a PSA in the 15 range in 71 year old male who is elected I-125 interstitial implant  PLAN: This time patient is cleared to go ahead with I-125 interstitial implant.  Informed consent was obtained.  Risks and benefits of treatment including increased lower urinary tract symptoms diarrhea fatigue alteration of blood counts risks of general surgery as well as radiation safety precautions all were reviewed with the patient and his wife.  He comprehends my treatment plan well.  Volume study was performed.  I would like to take this opportunity to thank you for allowing me to participate in the care of your patient.Noreene Filbert, MD

## 2022-01-02 ENCOUNTER — Ambulatory Visit (INDEPENDENT_AMBULATORY_CARE_PROVIDER_SITE_OTHER): Payer: Medicare HMO | Admitting: Physician Assistant

## 2022-01-02 ENCOUNTER — Other Ambulatory Visit: Payer: Self-pay | Admitting: Family Medicine

## 2022-01-02 VITALS — BP 159/88 | HR 90 | Ht 66.0 in | Wt 170.0 lb

## 2022-01-02 DIAGNOSIS — I10 Essential (primary) hypertension: Secondary | ICD-10-CM

## 2022-01-02 DIAGNOSIS — C61 Malignant neoplasm of prostate: Secondary | ICD-10-CM | POA: Diagnosis not present

## 2022-01-02 DIAGNOSIS — E782 Mixed hyperlipidemia: Secondary | ICD-10-CM

## 2022-01-02 MED ORDER — LEUPROLIDE ACETATE (6 MONTH) 45 MG ~~LOC~~ KIT
45.0000 mg | PACK | Freq: Once | SUBCUTANEOUS | Status: AC
  Administered 2022-01-02: 45 mg via SUBCUTANEOUS

## 2022-01-02 NOTE — Progress Notes (Signed)
Patient presented to clinic today for initiation of 6 months of ADT with Eligard per Dr. Baruch Gouty.   Patient reports eating a rather healthy diet and exercising regularly with walking and use of resistance bands.  He has a history of HTN, HLD.  We discussed the anticipated side effects of ADT today including hot flashes, decreased libido, erectile dysfunction, breast tenderness or size changes, fatigue, weight gain, bone loss, muscle mass loss, brain fog, increased cholesterol, increased blood pressure, increased blood sugar, and increased risk for stroke and heart attack.  I counseled him to maintain a healthy diet and continue regular exercise including weightbearing exercise to mitigate bone and muscle loss for the duration of therapy.  Additionally, I counseled him to start daily calcium 1000-'1200mg'$  and vitamin D 800-1000IU supplements to reduce bone loss.  He expressed understanding, all questions answered. Printed resources on ADT provided today.  Debroah Loop, PA-C 01/02/22 3:14 PM  I spent 15 minutes on the day of the encounter to include pre-visit record review, face-to-face time with the patient, and post-visit ordering of tests.

## 2022-01-02 NOTE — Progress Notes (Signed)
Eligard SubQ Injection   Due to Prostate Cancer patient is present today for a Eligard Injection.  Medication: Eligard 6 month Dose: 45 mg  Location: right upper outer buttocks Lot: [id number] Exp: 03/2023  Patient tolerated well, no complications were noted  Performed by: Bradly Bienenstock CMA  Per Dr. Bernardo Heater patient is to continue therapy for 6 months . Patient's next follow up was scheduled for 07/10/21. Patient given reminder continue on Vitamin D 800-1000iu and Calcium 1000-'1200mg'$  daily while on Androgen Deprivation Therapy.

## 2022-01-02 NOTE — Patient Instructions (Signed)
Please take the following dietary supplements for the duration of your hormone suppression therapy to reduce your risk for bone loss: -Calcium 1000-1200mg daily -Vitamin D 800-1000IU daily  

## 2022-01-16 ENCOUNTER — Encounter
Admission: RE | Admit: 2022-01-16 | Discharge: 2022-01-16 | Disposition: A | Discharge status: Home / Self Care | Payer: Medicare HMO | Source: Ambulatory Visit | Attending: Urology | Admitting: Urology

## 2022-01-16 DIAGNOSIS — Z01818 Encounter for other preprocedural examination: Secondary | ICD-10-CM

## 2022-01-16 DIAGNOSIS — Z79899 Other long term (current) drug therapy: Secondary | ICD-10-CM

## 2022-01-16 HISTORY — DX: Other specified abnormal findings of blood chemistry: R79.89

## 2022-01-16 HISTORY — DX: Alcohol abuse, uncomplicated: F10.10

## 2022-01-16 HISTORY — DX: Gastro-esophageal reflux disease without esophagitis: K21.9

## 2022-01-16 HISTORY — DX: Malignant neoplasm of prostate: C61

## 2022-01-16 HISTORY — DX: Peptic ulcer, site unspecified, unspecified as acute or chronic, without hemorrhage or perforation: K27.9

## 2022-01-16 NOTE — Patient Instructions (Signed)
Your procedure is scheduled on:01-27-22 Tuesday Report to the Registration Desk on the 1st floor of the [facility].Then proceed to the 2nd floor Surgery Desk  To find out your arrival time, please call [phone] between 1PM - 3PM on:01-23-22 Friday If your arrival time is 6:00 am, do not arrive prior to that time as the [facility] entrance doors do not open until 6:00 am.  REMEMBER: Instructions that are not followed completely may result in serious medical risk, up to and including death; or upon the discretion of your surgeon and anesthesiologist your surgery may need to be rescheduled.  Do not eat food OR drink any liquids after midnight the night before surgery.  No gum chewing, lozengers or hard candies.  TAKE THESE MEDICATIONS THE MORNING OF SURGERY WITH A SIP OF WATER: -omeprazole (PRILOSEC) -take one the night before and one on the morning of surgery - helps to prevent nausea after surgery.)  DO Fleet Enema at home the morning of your surgery-Do Enema 1 hour prior to your arrival time to the hospital (enema enclosed in your bag)  One week prior to surgery: Stop Anti-inflammatories (NSAIDS) such as Advil, Aleve, Ibuprofen, Motrin, Naproxen, Naprosyn and Aspirin based products such as Excedrin, Goodys Powder, BC Powder.You may however, take Tylenol if needed for pain up until the day of surgery.  Stop ANY OVER THE COUNTER supplements/vitamins 7 days prior to surgery (Turmeric, Saw Palmetto, multivitamin, and Glucosamine)  No Alcohol for 24 hours before or after surgery.  No Smoking including e-cigarettes for 24 hours prior to surgery.  No chewable tobacco products for at least 6 hours prior to surgery.  No nicotine patches on the day of surgery.  Do not use any "recreational" drugs for at least a week prior to your surgery.  Please be advised that the combination of cocaine and anesthesia may have negative outcomes, up to and including death. If you test positive for cocaine,  your surgery will be cancelled.  On the morning of surgery brush your teeth with toothpaste and water, you may rinse your mouth with mouthwash if you wish. Do not swallow any toothpaste or mouthwash.  Do not wear jewelry, make-up, hairpins, clips or nail polish.  Do not wear lotions, powders, or perfumes.   Do not shave body from the neck down 48 hours prior to surgery just in case you cut yourself which could leave a site for infection.  Also, freshly shaved skin may become irritated if using the CHG soap.  Contact lenses, hearing aids and dentures may not be worn into surgery.  Do not bring valuables to the hospital. [facility] is not responsible for any missing/lost belongings or valuables.   Notify your doctor if there is any change in your medical condition (cold, fever, infection).  Wear comfortable clothing (specific to your surgery type) to the hospital.  After surgery, you can help prevent lung complications by doing breathing exercises.  Take deep breaths and cough every 1-2 hours. Your doctor may order a device called an Incentive Spirometer to help you take deep breaths. When coughing or sneezing, hold a pillow firmly against your incision with both hands. This is called "splinting." Doing this helps protect your incision. It also decreases belly discomfort.  If you are being admitted to the hospital overnight, leave your suitcase in the car. After surgery it may be brought to your room.  If you are being discharged the day of surgery, you will not be allowed to drive home.  You will need a responsible adult (18 years or older) to drive you home and stay with you that night.   If you are taking public transportation, you will need to have a responsible adult (18 years or older) with you. Please confirm with your physician that it is acceptable to use public transportation.   Please call the [facility] at [phone] if you have any questions about  these instructions.  Surgery Visitation Policy:  Patients undergoing a surgery or procedure may have two family members or support persons with them as long as the person is not COVID-19 positive or experiencing its symptoms.

## 2022-01-20 ENCOUNTER — Encounter
Admission: RE | Admit: 2022-01-20 | Discharge: 2022-01-20 | Disposition: A | Discharge status: Home / Self Care | Payer: Medicare HMO | Source: Ambulatory Visit | Attending: Urology | Admitting: Urology

## 2022-01-20 DIAGNOSIS — Z01818 Encounter for other preprocedural examination: Secondary | ICD-10-CM | POA: Insufficient documentation

## 2022-01-20 DIAGNOSIS — C61 Malignant neoplasm of prostate: Secondary | ICD-10-CM | POA: Diagnosis not present

## 2022-01-20 LAB — BASIC METABOLIC PANEL
Anion gap: 7 (ref 5–15)
BUN: 10 mg/dL (ref 8–23)
CO2: 30 mmol/L (ref 22–32)
Calcium: 10.5 mg/dL — ABNORMAL HIGH (ref 8.9–10.3)
Chloride: 97 mmol/L — ABNORMAL LOW (ref 98–111)
Creatinine, Ser: 0.84 mg/dL (ref 0.61–1.24)
GFR, Estimated: >60 mL/min (ref >60)
Glucose, Bld: 88 mg/dL (ref 70–99)
Potassium: 3.9 mmol/L (ref 3.5–5.1)
Sodium: 134 mmol/L — ABNORMAL LOW (ref 135–145)

## 2022-01-20 LAB — URINALYSIS, COMPLETE (UACMP) WITH MICROSCOPIC
Bacteria, UA: NONE SEEN
Bilirubin Urine: NEGATIVE
Glucose, UA: NEGATIVE mg/dL
Hgb urine dipstick: NEGATIVE
Ketones, ur: NEGATIVE mg/dL
Leukocytes,Ua: NEGATIVE
Nitrite: NEGATIVE
Protein, ur: NEGATIVE mg/dL
Specific Gravity, Urine: 1.009 (ref 1.005–1.030)
Squamous Epithelial / HPF: NONE SEEN (ref 0–5)
pH: 8 (ref 5.0–8.0)

## 2022-01-21 ENCOUNTER — Ambulatory Visit
Admission: RE | Admit: 2022-01-21 | Discharge: 2022-01-21 | Disposition: A | Discharge status: Home / Self Care | Payer: Medicare HMO | Source: Ambulatory Visit | Attending: Radiation Oncology | Admitting: Radiation Oncology

## 2022-01-21 DIAGNOSIS — Z510 Encounter for antineoplastic radiation therapy: Secondary | ICD-10-CM | POA: Insufficient documentation

## 2022-01-21 DIAGNOSIS — C61 Malignant neoplasm of prostate: Secondary | ICD-10-CM | POA: Insufficient documentation

## 2022-01-21 LAB — URINE CULTURE: Culture: NO GROWTH

## 2022-01-27 ENCOUNTER — Emergency Department
Admission: EM | Admit: 2022-01-27 | Discharge: 2022-01-28 | Disposition: A | Discharge status: Home / Self Care | Payer: Medicare HMO | Source: Home / Self Care | Attending: Emergency Medicine | Admitting: Emergency Medicine

## 2022-01-27 ENCOUNTER — Other Ambulatory Visit: Payer: Self-pay

## 2022-01-27 ENCOUNTER — Ambulatory Visit: Payer: Medicare HMO | Admitting: Certified Registered"

## 2022-01-27 ENCOUNTER — Ambulatory Visit: Payer: Medicare HMO | Admitting: Urgent Care

## 2022-01-27 ENCOUNTER — Encounter: Payer: Self-pay | Admitting: Urology

## 2022-01-27 ENCOUNTER — Ambulatory Visit: Payer: Medicare HMO

## 2022-01-27 ENCOUNTER — Ambulatory Visit
Admission: RE | Admit: 2022-01-27 | Discharge: 2022-01-27 | Disposition: A | Discharge status: Home / Self Care | Payer: Medicare HMO | Attending: Urology | Admitting: Urology

## 2022-01-27 ENCOUNTER — Encounter
Admission: RE | Disposition: A | Discharge status: Home / Self Care | Payer: Self-pay | Source: Home / Self Care | Attending: Urology

## 2022-01-27 DIAGNOSIS — C61 Malignant neoplasm of prostate: Secondary | ICD-10-CM | POA: Diagnosis not present

## 2022-01-27 DIAGNOSIS — R339 Retention of urine, unspecified: Secondary | ICD-10-CM | POA: Insufficient documentation

## 2022-01-27 DIAGNOSIS — Z191 Hormone sensitive malignancy status: Secondary | ICD-10-CM | POA: Diagnosis not present

## 2022-01-27 DIAGNOSIS — C679 Malignant neoplasm of bladder, unspecified: Secondary | ICD-10-CM | POA: Diagnosis not present

## 2022-01-27 DIAGNOSIS — G4733 Obstructive sleep apnea (adult) (pediatric): Secondary | ICD-10-CM | POA: Diagnosis not present

## 2022-01-27 DIAGNOSIS — Z87891 Personal history of nicotine dependence: Secondary | ICD-10-CM | POA: Insufficient documentation

## 2022-01-27 DIAGNOSIS — K219 Gastro-esophageal reflux disease without esophagitis: Secondary | ICD-10-CM | POA: Insufficient documentation

## 2022-01-27 DIAGNOSIS — I10 Essential (primary) hypertension: Secondary | ICD-10-CM | POA: Diagnosis not present

## 2022-01-27 HISTORY — PX: RADIOACTIVE SEED IMPLANT: SHX5150

## 2022-01-27 SURGERY — INSERTION, RADIATION SOURCE, PROSTATE
Anesthesia: General | Site: Prostate

## 2022-01-27 MED ORDER — CHLORHEXIDINE GLUCONATE 0.12 % MT SOLN
OROMUCOSAL | Status: AC
  Administered 2022-01-27: 15 mL via OROMUCOSAL
  Filled 2022-01-27: qty 15

## 2022-01-27 MED ORDER — SODIUM CHLORIDE 0.9 % IR SOLN
Status: DC | PRN
  Administered 2022-01-27: 1000 mL via INTRAVESICAL

## 2022-01-27 MED ORDER — CHLORHEXIDINE GLUCONATE 0.12 % MT SOLN: 15.0000 mL | Freq: Once | OROMUCOSAL | Status: AC

## 2022-01-27 MED ORDER — ACETAMINOPHEN 325 MG PO TABS
650.0000 mg | ORAL_TABLET | Freq: Once | ORAL | Status: AC
  Administered 2022-01-27: 650 mg via ORAL
  Filled 2022-01-27: qty 2

## 2022-01-27 MED ORDER — GLYCOPYRROLATE 0.2 MG/ML IJ SOLN
INTRAMUSCULAR | Status: DC | PRN
  Administered 2022-01-27: .2 mg via INTRAVENOUS

## 2022-01-27 MED ORDER — BACITRACIN 500 UNIT/GM EX OINT
TOPICAL_OINTMENT | CUTANEOUS | Status: DC | PRN
  Administered 2022-01-27: 1 via TOPICAL

## 2022-01-27 MED ORDER — OXYCODONE HCL 5 MG/5ML PO SOLN: 5.0000 mg | Freq: Once | ORAL | Status: DC | PRN

## 2022-01-27 MED ORDER — STERILE WATER FOR IRRIGATION IR SOLN
Status: DC | PRN
  Administered 2022-01-27: 3000 mL

## 2022-01-27 MED ORDER — FLEET ENEMA 7-19 GM/118ML RE ENEM: 1.0000 | ENEMA | Freq: Once | RECTAL | Status: DC

## 2022-01-27 MED ORDER — CEFAZOLIN SODIUM-DEXTROSE 2-4 GM/100ML-% IV SOLN
INTRAVENOUS | Status: AC
  Filled 2022-01-27: qty 100

## 2022-01-27 MED ORDER — FENTANYL CITRATE (PF) 100 MCG/2ML IJ SOLN: 25.0000 ug | INTRAMUSCULAR | Status: DC | PRN

## 2022-01-27 MED ORDER — DEXAMETHASONE SODIUM PHOSPHATE 10 MG/ML IJ SOLN
INTRAMUSCULAR | Status: DC | PRN
  Administered 2022-01-27: 5 mg via INTRAVENOUS

## 2022-01-27 MED ORDER — FENTANYL CITRATE (PF) 100 MCG/2ML IJ SOLN
INTRAMUSCULAR | Status: DC | PRN
  Administered 2022-01-27 (×2): 50 ug via INTRAVENOUS

## 2022-01-27 MED ORDER — LIDOCAINE HCL (CARDIAC) PF 100 MG/5ML IV SOSY
PREFILLED_SYRINGE | INTRAVENOUS | Status: DC | PRN
  Administered 2022-01-27: 80 mg via INTRAVENOUS

## 2022-01-27 MED ORDER — PROPOFOL 10 MG/ML IV BOLUS
INTRAVENOUS | Status: DC | PRN
  Administered 2022-01-27: 130 mg via INTRAVENOUS

## 2022-01-27 MED ORDER — ORAL CARE MOUTH RINSE: 15.0000 mL | Freq: Once | OROMUCOSAL | Status: AC

## 2022-01-27 MED ORDER — PHENYLEPHRINE HCL (PRESSORS) 10 MG/ML IV SOLN
INTRAVENOUS | Status: DC | PRN
  Administered 2022-01-27: 80 ug via INTRAVENOUS
  Administered 2022-01-27: 50 ug via INTRAVENOUS
  Administered 2022-01-27: 80 ug via INTRAVENOUS
  Administered 2022-01-27: 50 ug via INTRAVENOUS
  Administered 2022-01-27 (×2): 80 ug via INTRAVENOUS

## 2022-01-27 MED ORDER — ONDANSETRON HCL 4 MG/2ML IJ SOLN
INTRAMUSCULAR | Status: DC | PRN
  Administered 2022-01-27: 4 mg via INTRAVENOUS

## 2022-01-27 MED ORDER — TAMSULOSIN HCL 0.4 MG PO CAPS: 0.4000 mg | ORAL_CAPSULE | Freq: Every day | ORAL | 0 refills | Status: DC

## 2022-01-27 MED ORDER — CEFAZOLIN SODIUM-DEXTROSE 2-4 GM/100ML-% IV SOLN
2.0000 g | INTRAVENOUS | Status: AC
  Administered 2022-01-27: 2 g via INTRAVENOUS

## 2022-01-27 MED ORDER — EPHEDRINE SULFATE (PRESSORS) 50 MG/ML IJ SOLN
INTRAMUSCULAR | Status: DC | PRN
  Administered 2022-01-27: 10 mg via INTRAVENOUS
  Administered 2022-01-27: 5 mg via INTRAVENOUS
  Administered 2022-01-27: 10 mg via INTRAVENOUS

## 2022-01-27 MED ORDER — SUGAMMADEX SODIUM 200 MG/2ML IV SOLN
INTRAVENOUS | Status: DC | PRN
  Administered 2022-01-27: 300 mg via INTRAVENOUS

## 2022-01-27 MED ORDER — BACITRACIN ZINC 500 UNIT/GM EX OINT
TOPICAL_OINTMENT | CUTANEOUS | Status: AC
  Filled 2022-01-27: qty 28.35

## 2022-01-27 MED ORDER — ACETAMINOPHEN 10 MG/ML IV SOLN
INTRAVENOUS | Status: AC
Start: 2022-01-27 — End: ?
  Filled 2022-01-27: qty 100

## 2022-01-27 MED ORDER — CIPROFLOXACIN HCL 500 MG PO TABS: 500.0000 mg | ORAL_TABLET | Freq: Two times a day (BID) | ORAL | 0 refills | Status: AC

## 2022-01-27 MED ORDER — ACETAMINOPHEN 10 MG/ML IV SOLN
INTRAVENOUS | Status: DC | PRN
  Administered 2022-01-27: 1000 mg via INTRAVENOUS

## 2022-01-27 MED ORDER — LACTATED RINGERS IV SOLN: INTRAVENOUS | Status: DC

## 2022-01-27 MED ORDER — ROCURONIUM BROMIDE 100 MG/10ML IV SOLN
INTRAVENOUS | Status: DC | PRN
  Administered 2022-01-27 (×2): 20 mg via INTRAVENOUS
  Administered 2022-01-27: 50 mg via INTRAVENOUS

## 2022-01-27 MED ORDER — OXYCODONE HCL 5 MG PO TABS: 5.0000 mg | ORAL_TABLET | Freq: Once | ORAL | Status: DC | PRN

## 2022-01-27 MED ORDER — PROPOFOL 10 MG/ML IV BOLUS
INTRAVENOUS | Status: AC
  Filled 2022-01-27: qty 20

## 2022-01-27 MED ORDER — FENTANYL CITRATE (PF) 100 MCG/2ML IJ SOLN
INTRAMUSCULAR | Status: AC
  Filled 2022-01-27: qty 2

## 2022-01-27 SURGICAL SUPPLY — 27 items
BAG DRN RND TRDRP ANRFLXCHMBR (UROLOGICAL SUPPLIES) ×1
BAG URINE DRAIN 2000ML AR STRL (UROLOGICAL SUPPLIES) ×1 IMPLANT
BLADE CLIPPER SURG (BLADE) ×1 IMPLANT
BRUSH SCRUB EZ 1% IODOPHOR (MISCELLANEOUS) ×1 IMPLANT
CATH FOL 2WAY LX 16X5 (CATHETERS) ×1 IMPLANT
COVER BACK TABLE REUSABLE LG (DRAPES) ×1 IMPLANT
DRAPE INCISE 23X17 IOBAN STRL (DRAPES) ×1
DRAPE INCISE 23X17 STRL (DRAPES) ×1 IMPLANT
DRAPE INCISE IOBAN 23X17 STRL (DRAPES) ×1 IMPLANT
DRAPE UNDER BUTTOCK W/FLU (DRAPES) ×1 IMPLANT
DRSG TELFA 3X8 NADH STRL (GAUZE/BANDAGES/DRESSINGS) ×1 IMPLANT
GAUZE 4X4 16PLY ~~LOC~~+RFID DBL (SPONGE) ×2 IMPLANT
GLOVE BIO SURGEON STRL SZ7.5 (GLOVE) ×2 IMPLANT
GLOVE SURG UNDER POLY LF SZ7.5 (GLOVE) ×2 IMPLANT
GOWN STRL REUS W/ TWL LRG LVL3 (GOWN DISPOSABLE) ×1 IMPLANT
GOWN STRL REUS W/ TWL XL LVL3 (GOWN DISPOSABLE) ×2 IMPLANT
GOWN STRL REUS W/TWL LRG LVL3 (GOWN DISPOSABLE) ×1
GOWN STRL REUS W/TWL XL LVL3 (GOWN DISPOSABLE) ×2
IV NS 1000ML (IV SOLUTION) ×1
IV NS 1000ML BAXH (IV SOLUTION) ×1 IMPLANT
KIT TURNOVER CYSTO (KITS) ×1 IMPLANT
PACK CYSTO AR (MISCELLANEOUS) ×1 IMPLANT
SET CYSTO W/LG BORE CLAMP LF (SET/KITS/TRAYS/PACK) ×1 IMPLANT
SURGILUBE 2OZ TUBE FLIPTOP (MISCELLANEOUS) ×1 IMPLANT
SYR 10ML LL (SYRINGE) ×1 IMPLANT
WATER STERILE IRR 3000ML UROMA (IV SOLUTION) IMPLANT
WATER STERILE IRR 500ML POUR (IV SOLUTION) ×1 IMPLANT

## 2022-01-27 NOTE — OR Nursing (Signed)
Patient had  99 seeds implanted and 30 needles

## 2022-01-27 NOTE — ED Notes (Signed)
14 fr foley cath removed and replaced with a 20 fr without difficulty.  Pt tolerated well.

## 2022-01-27 NOTE — ED Triage Notes (Signed)
Pt presents via POV c/o urinary retention. Reports had prostate seeding completed today. Reports unable to urinate since, only passing small amounts of blood.

## 2022-01-27 NOTE — Anesthesia Procedure Notes (Signed)
Procedure Name: Intubation Date/Time: 01/27/2022 7:31 AM  Performed by: Lerry Liner, CRNAPre-anesthesia Checklist: Patient identified, Emergency Drugs available, Suction available and Patient being monitored Patient Re-evaluated:Patient Re-evaluated prior to induction Oxygen Delivery Method: Circle system utilized Preoxygenation: Pre-oxygenation with 100% oxygen Induction Type: IV induction Ventilation: Mask ventilation without difficulty Laryngoscope Size: 4 Grade View: Grade I Tube type: Oral Tube size: 7.5 mm Number of attempts: 1 Airway Equipment and Method: Stylet and Oral airway Placement Confirmation: ETT inserted through vocal cords under direct vision, positive ETCO2 and breath sounds checked- equal and bilateral Secured at: 22 cm Tube secured with: Tape Dental Injury: Teeth and Oropharynx as per pre-operative assessment

## 2022-01-27 NOTE — Progress Notes (Signed)
Radiation Oncology I-125 interstitial implant note  Name: Jason Holmes   Date:   12/03/2021 MRN:  [id number] DOB: 11-29-1950    This 71 y.o. male presents to the OR today for I-125 interstitial implant for stage IIc adenocarcinoma the prostate REFERRING PROVIDER: No ref. provider found  HPI: Patient is a 71 year old male presents with a PSA of 6.8 found to have 3 of 12 cores positive for Gleason 6 adenocarcinoma the prostate.  His PSA climbed to 15 PET scan showed a single focus of activity in the left lobe of the prostate no evidence of metastatic adenopathy.  His Gleason score climbed to 4.367.  He was taken to the OR today for I-125 interstitial implant..  COMPLICATIONS OF TREATMENT: none  FOLLOW UP COMPLIANCE: keeps appointments   PHYSICAL EXAM:  BP 114/70   Pulse (!) 51   Temp (!) 97 F (36.1 C) (Temporal)   Resp 15   Ht '5\' 6"'$  (1.676 m)   Wt 167 lb (75.8 kg)   SpO2 100%   BMI 26.95 kg/m  Well-developed well-nourished patient in NAD. HEENT reveals PERLA, EOMI, discs not visualized.  Oral cavity is clear. No oral mucosal lesions are identified. Neck is clear without evidence of cervical or supraclavicular adenopathy. Lungs are clear to A&P. Cardiac examination is essentially unremarkable with regular rate and rhythm without murmur rub or thrill. Abdomen is benign with no organomegaly or masses noted. Motor sensory and DTR levels are equal and symmetric in the upper and lower extremities. Cranial nerves II through XII are grossly intact. Proprioception is intact. No peripheral adenopathy or edema is identified. No motor or sensory levels are noted. Crude visual fields are within normal range.  RADIOLOGY RESULTS: Ultrasound used for source placement  PLAN: Patient was taken to the operating room and general anesthesia was administered. Legs were immobilized in stirrups and patient was positioned in the exact same proportions as original volume study. Patient was prepped and Foley  catheter was placed. Ultrasound guidance identified the prostate and recreated the original set up as per treatment planning volume study.  A needle grid was attached to the ultrasound probe to position the needles.  27 needles were placed under ultrasound guidance  to the  prostate PTV  . After completion of procedure cystoscopy was performed by urology and no evidence of seeds in the bladder were noted. Patient tolerated the procedure extremely well. Initial plain film as doublecheck identified 80 seeds in the prostate. Patient has followup appointment in one month for CT scan for quality assurance will be performed.Prior to implant 10% or 10 loose seeds which is ever higher were ordered and assayed to verify source strength.  Time of cystoscopy a papillary bladder tumor was noted and biopsied by urology.     Noreene Filbert, MD

## 2022-01-27 NOTE — Discharge Instructions (Addendum)

## 2022-01-27 NOTE — Op Note (Signed)
Preoperative diagnosis: Adenocarcinoma of the prostate    Postoperative diagnosis: Same    Procedure:  I-125 prostate seed implantation, cystoscopy Bladder biopsy and fulguration(1cm tumor)   Surgeon: Nickolas Madrid, MD   Radiation Oncologist: Noreene Filbert, M.D.    Anesthesia: General   Drains: none  Specimen: Bladder tumor   Complications: none   Indications: Prostate cancer   Procedure: The patient was brought to operating suite and placement table in the supine position. At this time, a universal timeout protocol was performed, all team members were identified, Venodyne boots are placed, and he was administered IV Ancef in the preoperative period. He was placed in lithotomy position and prepped and draped in usual fashion. The radiation oncology department placed a transrectal ultrasound probe anchoring stand/ grid and aligned with previous imaging from the volume study. Foley catheter was inserted without difficulty.  All needle passage was done with real-time transrectal ultrasound guidance in both the transverse and sagittal plains in order to achieve the desired preplanned position. A total of 30 needles were placed.  99 active seeds were implanted. The Foley catheter was removed and a rigid cystoscopy showed a single seed in the bladder which was evacuated and removed.  There was no active bleeding noted.  The prostate was moderate in size with a slightly elevated bladder neck.  There was a small 1 cm papillary bladder tumor just posterior lateral to the left ureteral orifice.  No other suspicious lesions.  The cold cup biopsy forcep was used to remove the lesion in its entirety, and meticulous fulguration performed with the [place] taking care to avoid the ureteral orifice.  There is excellent hemostasis with the bladder decompressed, and all abnormal tissue was removed.  The bladder was drained.  A fluoroscopic image was then obtained showing excellent distrubution of the  brachytherapy seeds.  Each seed was counted and counts were correct.     The patient was then repositioned in the supine position, reversed from anesthesia, and taken to the PACU in stable condition.  Will call with pathology results, anticipate surveillance cystoscopy in 3 months  Nickolas Madrid, MD 01/27/2022

## 2022-01-27 NOTE — Transfer of Care (Signed)
Immediate Anesthesia Transfer of Care Note  Patient: Jason Holmes  Procedure(s) Performed: RADIOACTIVE SEED IMPLANT/BRACHYTHERAPY IMPLANT/CYSTOSCOPY/ BLADDER BIOPSY/FULGERATION (Prostate)  Patient Location: PACU  Anesthesia Type:General  Level of Consciousness: drowsy  Airway & Oxygen Therapy: Patient Spontanous Breathing and Patient connected to face mask oxygen  Post-op Assessment: Report given to RN  Post vital signs: stable  Last Vitals:  Vitals Value Taken Time  BP    Temp    Pulse    Resp    SpO2      Last Pain:  Vitals:   01/27/22 0637  TempSrc: Oral  PainSc: 0-No pain         Complications: No notable events documented.

## 2022-01-27 NOTE — Anesthesia Postprocedure Evaluation (Signed)
Anesthesia Post Note  Patient: Jason Holmes  Procedure(s) Performed: RADIOACTIVE SEED IMPLANT/BRACHYTHERAPY IMPLANT/CYSTOSCOPY/ BLADDER BIOPSY/FULGERATION (Prostate)  Patient location during evaluation: PACU Anesthesia Type: General Level of consciousness: awake and alert Pain management: pain level controlled Vital Signs Assessment: post-procedure vital signs reviewed and stable Respiratory status: spontaneous breathing, nonlabored ventilation, respiratory function stable and patient connected to nasal cannula oxygen Cardiovascular status: blood pressure returned to baseline and stable Postop Assessment: no apparent nausea or vomiting Anesthetic complications: no   No notable events documented.   Last Vitals:  Vitals:   01/27/22 0930 01/27/22 0945  BP: 135/85 114/70  Pulse: 69 (!) 51  Resp: 18 15  Temp:  (!) 36.1 C  SpO2: 95% 100%    Last Pain:  Vitals:   01/27/22 0945  TempSrc: Temporal  PainSc: 0-No pain                 Precious Haws Vika Buske

## 2022-01-27 NOTE — H&P (Signed)
   01/27/22 7:02 AM   [name] 1950-06-27 [id number]  CC: Prostate cancer  HPI: 71 year old male with unfavorable intermediate risk prostate cancer who opted for brachytherapy.  Here today for simultaneous procedure with radiation oncology for seed placement.  Also received ADT in clinic.   PMH: Past Medical History:  Diagnosis Date   Anemia    Elevated PSA    Elevated TSH    Erectile dysfunction    Excessive drinking of alcohol    GERD (gastroesophageal reflux disease)    Hyperlipidemia    Hypertension    Peptic ulcer    Prostate cancer ([place])    Sciatica    right    Surgical History: Past Surgical History:  Procedure Laterality Date   COLONOSCOPY N/A 02/24/2017   Procedure: COLONOSCOPY;  Surgeon: Lin Landsman, MD;  Location: [facility];  Service: Endoscopy;  Laterality: N/A;   NASAL FRACTURE SURGERY  2005   PROSTATE BIOPSY     PROSTATE BIOPSY N/A 03/05/2020   Procedure: BIOPSY TRANSRECTAL ULTRASONIC PROSTATE (TUBP);  Surgeon: [name], MD;  Location: [facility];  Service: Urology;  Laterality: N/A;   PROSTATE BIOPSY N/A 11/04/2021   Procedure: PROSTATE BIOPSY;  Surgeon: [name], MD;  Location: [facility];  Service: Urology;  Laterality: N/A;   TONSILLECTOMY  1972   TRANSRECTAL ULTRASOUND N/A 11/04/2021   Procedure: TRANSRECTAL ULTRASOUND;  Surgeon: [name], MD;  Location: [facility];  Service: Urology;  Laterality: N/A;    Family History: Family History  Problem Relation Age of Onset   Heart attack Mother    Heart attack Father    Prostate cancer Neg Hx    Kidney cancer Neg Hx     Social History:  reports that he quit smoking about 19 years ago. His smoking use included cigarettes. His smokeless tobacco use includes chew. He reports current alcohol use of about 70.0 standard drinks of alcohol per week. He reports that he does not use drugs.  Physical Exam: BP (!) 146/74   Pulse (!) 55   Temp (!) 97.5 F (36.4 C)  (Oral)   Resp 16   Ht '5\' 6"'$  (1.676 m)   Wt 75.8 kg   SpO2 100%   BMI 26.95 kg/m    Constitutional:  Alert and oriented, No acute distress. Cardiovascular: Regular rate and rhythm Respiratory: Clear to auscultation bilaterally GI: Abdomen is soft, nontender, nondistended, no abdominal masses   Laboratory Data: Culture 8/29 no growth  Assessment & Plan:   71 year old male patient of Dr. Dene Gentry with unfavorable intermediate risk prostate cancer here today for brachytherapy in conjunction with radiation oncology.  We discussed the risks of bleeding, infection/sepsis, urinary symptoms, possible need for Foley catheter placement, risk of recurrence.  Brachytherapy today  Nickolas Madrid, MD 01/27/2022  [facility] [address] [place], [place] [address] [phone]

## 2022-01-27 NOTE — Anesthesia Preprocedure Evaluation (Signed)
Anesthesia Evaluation  Patient identified by MRN, date of birth, ID band Patient awake    Reviewed: Allergy & Precautions, NPO status , Patient's Chart, lab work & pertinent test results  History of Anesthesia Complications Negative for: history of anesthetic complications  Airway Mallampati: III  TM Distance: <3 FB Neck ROM: full    Dental  (+) Chipped   Pulmonary neg shortness of breath, sleep apnea , former smoker,    Pulmonary exam normal        Cardiovascular Exercise Tolerance: Good hypertension, (-) anginaNormal cardiovascular exam     Neuro/Psych  Neuromuscular disease negative psych ROS   GI/Hepatic Neg liver ROS, PUD, GERD  Controlled,  Endo/Other  negative endocrine ROS  Renal/GU      Musculoskeletal   Abdominal   Peds  Hematology negative hematology ROS (+)   Anesthesia Other Findings Past Medical History: No date: Anemia No date: Elevated PSA No date: Elevated TSH No date: Erectile dysfunction No date: Excessive drinking of alcohol No date: GERD (gastroesophageal reflux disease) No date: Hyperlipidemia No date: Hypertension No date: Peptic ulcer No date: Prostate cancer ([place]) No date: Sciatica     Comment:  right  Past Surgical History: 02/24/2017: COLONOSCOPY; N/A     Comment:  Procedure: COLONOSCOPY;  Surgeon: Lin Landsman,               MD;  Location: [place];  Service: Endoscopy;               Laterality: N/A; 2005: NASAL FRACTURE SURGERY No date: PROSTATE BIOPSY 03/05/2020: PROSTATE BIOPSY; N/A     Comment:  Procedure: BIOPSY TRANSRECTAL ULTRASONIC PROSTATE               (TUBP);  Surgeon: [name], MD;  Location: [facility]               ORS;  Service: Urology;  Laterality: N/A; 11/04/2021: PROSTATE BIOPSY; N/A     Comment:  Procedure: PROSTATE BIOPSY;  Surgeon: [name],               MD;  Location: [facility] ORS;  Service: Urology;  Laterality:                N/A; 1972: TONSILLECTOMY 11/04/2021: TRANSRECTAL ULTRASOUND; N/A     Comment:  Procedure: TRANSRECTAL ULTRASOUND;  Surgeon: [name], MD;  Location: [facility] ORS;  Service: Urology;                Laterality: N/A;  BMI    Body Mass Index: 26.95 kg/m      Reproductive/Obstetrics negative OB ROS                             Anesthesia Physical Anesthesia Plan  ASA: 3  Anesthesia Plan: General ETT   Post-op Pain Management:    Induction: Intravenous  PONV Risk Score and Plan: Ondansetron, Dexamethasone, Midazolam and Treatment may vary due to age or medical condition  Airway Management Planned: Oral ETT  Additional Equipment:   Intra-op Plan:   Post-operative Plan: Extubation in OR  Informed Consent: I have reviewed the patients History and Physical, chart, labs and discussed the procedure including the risks, benefits and alternatives for the proposed anesthesia with the patient or authorized representative who has indicated his/her understanding and acceptance.     Dental  Advisory Given  Plan Discussed with: Anesthesiologist, CRNA and Surgeon  Anesthesia Plan Comments: (Patient consented for risks of anesthesia including but not limited to:  - adverse reactions to medications - damage to eyes, teeth, lips or other oral mucosa - nerve damage due to positioning  - sore throat or hoarseness - Damage to heart, brain, nerves, lungs, other parts of body or loss of life  Patient voiced understanding.)        Anesthesia Quick Evaluation

## 2022-01-27 NOTE — ED Provider Notes (Signed)
[facility] Provider Note  Patient Contact: 8:55 PM (approximate)   History   Urinary Retention   HPI  Jason Holmes is a 71 y.o. male who presents the emergency department complaining of urinary retention.  Patient had seeds placed this morning.  Patient went home, started to have a urge to urinate but cannot.  Patient comes in with significant urge to urinate.  Patient was bladder scanned after expressing a small amount of urine and blood and still had over 600 mL in his bladder.  Patient has Foley cath at this time.  Patient denied any back pain, fevers or chills.  Patient is here for urinary retention after procedure only.  Patient is to start Flomax and an antibiotic this evening.     Physical Exam   Triage Vital Signs: ED Triage Vitals  Enc Vitals Group     BP 01/27/22 1906 (!) 173/92     Pulse Rate 01/27/22 1906 88     Resp 01/27/22 1906 14     Temp 01/27/22 1906 98.5 F (36.9 C)     Temp Source 01/27/22 1906 Oral     SpO2 01/27/22 1906 97 %     Weight 01/27/22 1907 167 lb (75.8 kg)     Height 01/27/22 1907 '5\' 6"'$  (1.676 m)     Head Circumference --      Peak Flow --      Pain Score 01/27/22 1907 0     Pain Loc --      Pain Edu? --      Excl. in Felt? --     Most recent vital signs: Vitals:   01/27/22 1906  BP: (!) 173/92  Pulse: 88  Resp: 14  Temp: 98.5 F (36.9 C)  SpO2: 97%     General: Alert and in no acute distress.  Cardiovascular:  Good peripheral perfusion Respiratory: Normal respiratory effort without tachypnea or retractions. Lungs CTAB.  Gastrointestinal: Bowel sounds 4 quadrants. Soft and nontender to palpation. No guarding or rigidity. No palpable masses. No distention. No CVA tenderness. Musculoskeletal: Full range of motion to all extremities.  Neurologic:  No gross focal neurologic deficits are appreciated.  Skin:   No rash noted Other:   ED Results / Procedures / Treatments   Labs (all labs ordered are  listed, but only abnormal results are displayed) Labs Reviewed  URINALYSIS, ROUTINE W REFLEX MICROSCOPIC     EKG     RADIOLOGY    PROCEDURES:  Critical Care performed: No  Procedures   MEDICATIONS ORDERED IN ED: Medications - No data to display   IMPRESSION / MDM / [place] / ED COURSE  I reviewed the triage vital signs and the nursing notes.                              Differential diagnosis includes, but is not limited to, urinary retention, complication from procedure, UTI  Patient's presentation is most consistent with acute presentation with potential threat to life or bodily function.   Patient's diagnosis is consistent with acute urinary retention.  Patient presented to the ED with urinary retention after a urology procedure earlier today.  Patient had seeds placed.  Patient had acute urinary retention.  He was able to express a small amount of urine with straining but postvoid showed over 600 mL and the bladder.  Urine is bloody at this time.  Patient is to start  Flomax and antibiotics prescribed by urology.  Concerning signs and symptoms are discussed with the patient.  Otherwise follow-up with urology, primary care or return precautions to the ED are discussed..  Patient is given ED precautions to return to the ED for any worsening or new symptoms.    Clinical Course as of 01/27/22 2331  Tue Jan 27, 2022  2330 Patient had some ongoing bleeding into the Foley catheter bag, bleeding around the urinary catheter.  Reached out to Dr. Caprice Beaver who is on-call for urology but also performed patient's procedure earlier today.  He reports that the patient needs a larger urinary catheter, 14 [place] was placed and the patient needs a 2422.  This needs to be at 2 AM, flush the bladder and then follow-up with urology. [JC]    Clinical Course User Index [JC] Jaliza Seifried, Charline Bills, PA-C     FINAL CLINICAL IMPRESSION(S) / ED DIAGNOSES   Final diagnoses:   Urinary retention     Rx / DC Orders   ED Discharge Orders     None        Note:  This document was prepared using Dragon voice recognition software and may include unintentional dictation errors.   Brynda Peon 01/27/22 2122    Duffy Bruce, MD 01/27/22 2350

## 2022-01-28 ENCOUNTER — Telehealth: Payer: Self-pay | Admitting: Urology

## 2022-01-28 ENCOUNTER — Ambulatory Visit: Payer: Medicare HMO | Admitting: Physician Assistant

## 2022-01-28 DIAGNOSIS — C672 Malignant neoplasm of lateral wall of bladder: Secondary | ICD-10-CM | POA: Diagnosis not present

## 2022-01-28 DIAGNOSIS — R338 Other retention of urine: Secondary | ICD-10-CM | POA: Diagnosis not present

## 2022-01-28 LAB — SURGICAL PATHOLOGY

## 2022-01-28 NOTE — ED Notes (Signed)
Foley cath irrigated with sterile water approx 500.  Pt tolerated well.   Pt left with foley in place and a leg bag to go.  Pt alert.   Family with pt

## 2022-01-28 NOTE — Telephone Encounter (Signed)
pt wife called, Lelon Frohlich.  Can Kip be seen today  having problems with cath  leaking out.  Lelon Frohlich [phone]

## 2022-01-28 NOTE — Progress Notes (Signed)
01/28/2022 5:09 PM   Jason Holmes [birth year] [id number]  CC: Chief Complaint  Patient presents with   Hematuria   HPI: Jason Holmes is a 71 y.o. male with unfavorable intermediate risk prostate cancer who underwent brachyseed placement with Dr. Diamantina Providence yesterday with intraoperative findings of a 1 cm bladder tumor who subsequently developed gross hematuria and urinary retention requiring Foley catheter placement in the ED who presents today with reports of nondraining Foley catheter.  He is accompanied today by his wife, who contributes to HPI.  Today he reports urine stopped draining into his Foley catheter several hours ago.  He has had passage of grossly bloody urine around the catheter tubing.  His bladder feels rather full.  44 French Foley catheter in place with a small volume of grossly bloody urine without clots in the night bag.  Notably, surgical pathology has come back with low-grade papillary urothelial carcinoma of the bladder, noninvasive.  Muscularis propria is present and not involved.  PMH: Past Medical History:  Diagnosis Date   Anemia    Elevated PSA    Elevated TSH    Erectile dysfunction    Excessive drinking of alcohol    GERD (gastroesophageal reflux disease)    Hyperlipidemia    Hypertension    Peptic ulcer    Prostate cancer ([place])    Sciatica    right    Surgical History: Past Surgical History:  Procedure Laterality Date   COLONOSCOPY N/A 02/24/2017   Procedure: COLONOSCOPY;  Surgeon: Lin Landsman, MD;  Location: [place];  Service: Endoscopy;  Laterality: N/A;   NASAL FRACTURE SURGERY  2005   PROSTATE BIOPSY     PROSTATE BIOPSY N/A 03/05/2020   Procedure: BIOPSY TRANSRECTAL ULTRASONIC PROSTATE (TUBP);  Surgeon: [name], MD;  Location: [facility]S;  Service: Urology;  Laterality: N/A;   PROSTATE BIOPSY N/A 11/04/2021   Procedure: PROSTATE BIOPSY;  Surgeon: [name], MD;  Location: [facility]S;  Service:  Urology;  Laterality: N/A;   RADIOACTIVE SEED IMPLANT N/A 01/27/2022   Procedure: RADIOACTIVE SEED IMPLANT/BRACHYTHERAPY IMPLANT/CYSTOSCOPY/ BLADDER BIOPSY/FULGERATION;  Surgeon: Billey Co, MD;  Location: [facility]S;  Service: Urology;  Laterality: N/A;   TONSILLECTOMY  1972   TRANSRECTAL ULTRASOUND N/A 11/04/2021   Procedure: TRANSRECTAL ULTRASOUND;  Surgeon: [name], MD;  Location: [facility]S;  Service: Urology;  Laterality: N/A;    Home Medications:  Allergies as of 01/28/2022       Reactions   Lovastatin Other (See Comments)   I         Medication List        Accurate as of January 28, 2022  5:09 PM. If you have any questions, ask your nurse or doctor.          CALCIUM PO Take 1,100 mg by mouth daily at 6 (six) AM.   ciprofloxacin 500 MG tablet Commonly known as: Cipro Take 1 tablet (500 mg total) by mouth 2 (two) times daily for 3 days.   diphenhydrAMINE 25 MG tablet Commonly known as: BENADRYL Take 25 mg by mouth at bedtime.   GNP Glucosame Maximum Strength 1000 MG Tabs Generic drug: Glucosamine Sulfate Take 1,500 mg by mouth at bedtime.   hydrochlorothiazide 25 MG tablet Commonly known as: HYDRODIURIL TAKE 1 TABLET DAILY What changed: when to take this   ibuprofen 200 MG tablet Commonly known as: ADVIL Take 400 mg by mouth every 6 (six) hours as needed.   Multiple Vitamin  tablet Take 1 tablet by mouth daily. Silver   omeprazole 40 MG capsule Commonly known as: PRILOSEC TAKE 1 CAPSULE DAILY What changed:  how to take this when to take this   OVER THE COUNTER MEDICATION Apply 1 application topically daily as needed (pain). leg and back pain relief  CBD cream   Salonpas 3.05-30-08 % Ptch Generic drug: Camphor-Menthol-Methyl Sal Apply 1 patch topically daily as needed (Back pain).   SAW PALMETTO PO Take 1 tablet by mouth daily.   sildenafil 100 MG tablet Commonly known as: VIAGRA Take 1 tablet (100 mg total) by mouth daily as needed  for erectile dysfunction.   simvastatin 20 MG tablet Commonly known as: ZOCOR TAKE 1 TABLET AT BEDTIME   tamsulosin 0.4 MG Caps capsule Commonly known as: FLOMAX Take 1 capsule (0.4 mg total) by mouth daily after supper.   Turmeric 500 MG Caps Take 500 mg by mouth daily.        Allergies:  Allergies  Allergen Reactions   Lovastatin Other (See Comments)    I      Family History: Family History  Problem Relation Age of Onset   Heart attack Mother    Heart attack Father    Prostate cancer Neg Hx    Kidney cancer Neg Hx     Social History:   reports that he quit smoking about 19 years ago. His smoking use included cigarettes. His smokeless tobacco use includes chew. He reports current alcohol use of about 70.0 standard drinks of alcohol per week. He reports that he does not use drugs.  Physical Exam: There were no vitals taken for this visit.  Constitutional:  Alert and oriented, no acute distress, nontoxic appearing HEENT: [place], AT Cardiovascular: No clubbing, cyanosis, or edema Respiratory: Normal respiratory effort, no increased work of breathing Skin: No rashes, bruises or suspicious lesions Neurologic: Grossly intact, no focal deficits, moving all 4 extremities Psychiatric: Normal mood and affect  Laboratory Data: Results for orders placed or performed during the hospital encounter of 01/27/22  Surgical pathology  Result Value Ref Range   SURGICAL PATHOLOGY      SURGICAL PATHOLOGY CASE: [id number] PATIENT: Jason Holmes Surgical Pathology Report     Specimen Submitted: A. Bladder Tumor  Clinical History: Prostate Cancer      DIAGNOSIS: A.  BLADDER TUMOR; BIOPSY: - LOW-GRADE PAPILLARY UROTHELIAL CARCINOMA, NON-INVASIVE (WHO/ISUP). - MUSCULARIS PROPRIA IS PRESENT AND NOT INVOLVED.  GROSS DESCRIPTION: A. Labeled: Bladder tumor Received: Formalin Collection time: 8:47 AM on 01/27/2022 Placed into formalin time: 8:47 AM on 01/27/2022 Tissue  fragment(s): 3 Size: Aggregate, 0.8 x 0.7 x 0.2 cm Description: Received on a Telfa pad are fragments of pink soft tissue. Entirely submitted in 1 cassette.  RB 01/27/2022  Final Diagnosis performed by Quay Burow, MD.   Electronically signed 01/28/2022 9:08:22AM The electronic signature indicates that the named Attending Pathologist has evaluated the specimen Technical component performed at [facility], [address] [place], [place] [address] Lab: [phone] Dir: Rush Farmer,  MD, MMM  Professional component performed at [facility], [facility], [place], [place], [place] [address] Lab: [phone] Dir: Kathi Simpers, MD    Bladder Irrigation  Due to gross hematuria/nondraining Foley patient is present today for a bladder irrigation. Patient was cleaned and prepped in a sterile fashion. 750 ml of sterile water was instilled and irrigated into the bladder with a 36m To[place]y syringe through the catheter in place. Urine cleared from maroon to clear with this and  a total of 25ccs of clot material was evacuated from the bladder. Catheter irrigated easily and was draining well upon completion. Catheter was reattached to the night bag for drainage. Patient tolerated well.   Performed by: Debroah Loop, PA-C   Assessment & Plan:   1. Clot retention of urine Nondraining Foley catheter in the setting of significant gross hematuria following brachyseed placement consistent with clot occlusion of the catheter.  I irrigated the catheter in clinic, see above.  Upon completion, there was no evidence of active bleeding.  I also instructed the patient and his wife on how to perform catheter irrigation at home if this recurs.  I sent him home with a supply bag with an irrigation tray and sterile water.  We will plan for voiding trial with me early next week.  They are in agreement with this plan.  2. Malignant neoplasm of lateral wall of urinary bladder (HCC) We discussed  pathology findings of low-grade, nonmuscle invasive bladder cancer.  He will need surveillance cystoscopy in 3 months, no further treatment indicated at this time.  They expressed understanding.  Return in 5 days (on 02/02/2022) for Voiding trial.  Debroah Loop, PA-C  [facility] [address] [place] [place], [place] [address] [phone]

## 2022-01-28 NOTE — Telephone Encounter (Signed)
Spoke with patient and he will come in today at 2:30 to have cath flushed.  ( See secure chat note below, per [facility])   Catheter may just need to be irrigated, I can see him this morning if he can get to [place], or it looks like Sam has some availability this afternoon in [place]

## 2022-02-02 ENCOUNTER — Encounter: Payer: Self-pay | Admitting: Physician Assistant

## 2022-02-02 ENCOUNTER — Ambulatory Visit: Payer: Medicare HMO | Admitting: Physician Assistant

## 2022-02-02 VITALS — BP 136/73 | HR 78 | Ht 66.0 in | Wt 167.0 lb

## 2022-02-02 DIAGNOSIS — R339 Retention of urine, unspecified: Secondary | ICD-10-CM | POA: Diagnosis not present

## 2022-02-02 DIAGNOSIS — Z8551 Personal history of malignant neoplasm of bladder: Secondary | ICD-10-CM

## 2022-02-02 DIAGNOSIS — R300 Dysuria: Secondary | ICD-10-CM

## 2022-02-02 DIAGNOSIS — C672 Malignant neoplasm of lateral wall of bladder: Secondary | ICD-10-CM

## 2022-02-02 LAB — BLADDER SCAN AMB NON-IMAGING

## 2022-02-02 NOTE — Progress Notes (Signed)
02/02/2022 3:45 PM   Jason Holmes 1950-09-18 [id number]  CC: Chief Complaint  Patient presents with   Urinary Retention    Catheter removal    HPI: Jason Holmes is a 71 y.o. male with unfavorable intermediate risk prostate cancer who underwent brachii seed placement with Dr. Diamantina Providence 6 days ago with intraoperative findings of a 1 cm bladder tumor who subsequently developed gross hematuria and urinary retention requiring Foley catheter placement and subsequent catheter irrigation in clinic who presents today for voiding trial.   Today he reports no acute concerns.  His gross hematuria largely resolved after I irrigated him in clinic 5 days ago.  He is feeling well today.  Foley catheter removed in the morning, see separate procedure note for details he returned to clinic in the afternoon for PVR.  He has been able to void.  He has had some dysuria.  PVR 21 mL.  PMH: Past Medical History:  Diagnosis Date   Anemia    Elevated PSA    Elevated TSH    Erectile dysfunction    Excessive drinking of alcohol    GERD (gastroesophageal reflux disease)    Hyperlipidemia    Hypertension    Peptic ulcer    Prostate cancer ([place])    Sciatica    right    Surgical History: Past Surgical History:  Procedure Laterality Date   COLONOSCOPY N/A 02/24/2017   Procedure: COLONOSCOPY;  Surgeon: Lin Landsman, MD;  Location: [place];  Service: Endoscopy;  Laterality: N/A;   NASAL FRACTURE SURGERY  2005   PROSTATE BIOPSY     PROSTATE BIOPSY N/A 03/05/2020   Procedure: BIOPSY TRANSRECTAL ULTRASONIC PROSTATE (TUBP);  Surgeon: [name], MD;  Location: [facility];  Service: Urology;  Laterality: N/A;   PROSTATE BIOPSY N/A 11/04/2021   Procedure: PROSTATE BIOPSY;  Surgeon: [name], MD;  Location: [facility];  Service: Urology;  Laterality: N/A;   RADIOACTIVE SEED IMPLANT N/A 01/27/2022   Procedure: RADIOACTIVE SEED IMPLANT/BRACHYTHERAPY IMPLANT/CYSTOSCOPY/ BLADDER  BIOPSY/FULGERATION;  Surgeon: Billey Co, MD;  Location: [facility];  Service: Urology;  Laterality: N/A;   TONSILLECTOMY  1972   TRANSRECTAL ULTRASOUND N/A 11/04/2021   Procedure: TRANSRECTAL ULTRASOUND;  Surgeon: [name], MD;  Location: [facility];  Service: Urology;  Laterality: N/A;    Home Medications:  Allergies as of 02/02/2022       Reactions   Lovastatin Other (See Comments)   I         Medication List        Accurate as of February 02, 2022  3:45 PM. If you have any questions, ask your nurse or doctor.          CALCIUM PO Take 1,100 mg by mouth daily at 6 (six) AM.   diphenhydrAMINE 25 MG tablet Commonly known as: BENADRYL Take 25 mg by mouth at bedtime.   GNP Glucosame Maximum Strength 1000 MG Tabs Generic drug: Glucosamine Sulfate Take 1,500 mg by mouth at bedtime.   hydrochlorothiazide 25 MG tablet Commonly known as: HYDRODIURIL TAKE 1 TABLET DAILY What changed: when to take this   ibuprofen 200 MG tablet Commonly known as: ADVIL Take 400 mg by mouth every 6 (six) hours as needed.   Multiple Vitamin tablet Take 1 tablet by mouth daily. Silver   omeprazole 40 MG capsule Commonly known as: PRILOSEC TAKE 1 CAPSULE DAILY What changed:  how to take this when to take this   OVER THE  COUNTER MEDICATION Apply 1 application topically daily as needed (pain). leg and back pain relief  CBD cream   Salonpas 3.05-30-08 % Ptch Generic drug: Camphor-Menthol-Methyl Sal Apply 1 patch topically daily as needed (Back pain).   SAW PALMETTO PO Take 1 tablet by mouth daily.   sildenafil 100 MG tablet Commonly known as: VIAGRA Take 1 tablet (100 mg total) by mouth daily as needed for erectile dysfunction.   simvastatin 20 MG tablet Commonly known as: ZOCOR TAKE 1 TABLET AT BEDTIME   tamsulosin 0.4 MG Caps capsule Commonly known as: FLOMAX Take 1 capsule (0.4 mg total) by mouth daily after supper.   Turmeric 500 MG Caps Take 500 mg by mouth  daily.        Allergies:  Allergies  Allergen Reactions   Lovastatin Other (See Comments)    I      Family History: Family History  Problem Relation Age of Onset   Heart attack Mother    Heart attack Father    Prostate cancer Neg Hx    Kidney cancer Neg Hx     Social History:   reports that he quit smoking about 19 years ago. His smoking use included cigarettes. His smokeless tobacco use includes chew. He reports current alcohol use of about 70.0 standard drinks of alcohol per week. He reports that he does not use drugs.  Physical Exam: BP 136/73   Pulse 78   Ht '5\' 6"'$  (1.676 m)   Wt 167 lb (75.8 kg)   BMI 26.95 kg/m   Constitutional:  Alert and oriented, no acute distress, nontoxic appearing HEENT: [place], AT Cardiovascular: No clubbing, cyanosis, or edema Respiratory: Normal respiratory effort, no increased work of breathing Skin: No rashes, bruises or suspicious lesions Neurologic: Grossly intact, no focal deficits, moving all 4 extremities Psychiatric: Normal mood and affect  Laboratory Data: Results for orders placed or performed in visit on 02/02/22  Bladder Scan (Post Void Residual) in office  Result Value Ref Range   Scan Result 32m    Assessment & Plan:   1. Urinary retention Voiding trial passed today.  No indication for further intervention at this time. - Bladder Scan (Post Void Residual) in office  2. Dysuria Likely secondary to recent Foley removal and brachii seed placement.  We discussed return precautions including persistent dysuria, fevers, and testicular swelling, pain.  He denies these today.  3. Malignant neoplasm of lateral wall of urinary bladder (HCC) We will schedule him for surveillance cystoscopy with Dr. SBernardo Heaterin 3 months.  Return in about 3 months (around 05/04/2022) for Cystoscopy with Dr. SBernardo Heater  SDebroah Loop PA-C  [facility] [address] [place] [phone]

## 2022-02-02 NOTE — Progress Notes (Signed)
Catheter Removal  Patient is present today for a catheter removal.  64m of water was drained from the balloon. A foley cath was removed from the bladder, no complications were noted. Patient tolerated well.  Performed by: S.Ceonna Frazzini, CMA  Follow up/ Additional notes: patient scheduled to come back in the afternoon for PVR

## 2022-02-04 ENCOUNTER — Telehealth: Payer: Self-pay

## 2022-02-04 NOTE — Telephone Encounter (Signed)
Patient called stating he had a radioactive seed implant done 01/27/22. He was inquiring about getting a card to get through airport security. Returned patients call to advise that he contact Dr. Doristine Counter office about this.

## 2022-02-05 ENCOUNTER — Encounter: Payer: Self-pay | Admitting: Physician Assistant

## 2022-02-05 ENCOUNTER — Ambulatory Visit: Payer: Medicare HMO | Admitting: Physician Assistant

## 2022-02-05 VITALS — BP 127/73 | HR 66 | Ht 66.0 in | Wt 167.0 lb

## 2022-02-05 DIAGNOSIS — N401 Enlarged prostate with lower urinary tract symptoms: Secondary | ICD-10-CM

## 2022-02-05 DIAGNOSIS — R339 Retention of urine, unspecified: Secondary | ICD-10-CM | POA: Diagnosis not present

## 2022-02-05 DIAGNOSIS — R3916 Straining to void: Secondary | ICD-10-CM

## 2022-02-05 LAB — URINALYSIS, COMPLETE
Bilirubin, UA: NEGATIVE
Glucose, UA: NEGATIVE
Ketones, UA: NEGATIVE
Leukocytes,UA: NEGATIVE
Nitrite, UA: NEGATIVE
Protein,UA: NEGATIVE
Specific Gravity, UA: 1.015 (ref 1.005–1.030)
Urobilinogen, Ur: 0.2 mg/dL (ref 0.2–1.0)
pH, UA: 7.5 (ref 5.0–7.5)

## 2022-02-05 LAB — MICROSCOPIC EXAMINATION: Bacteria, UA: NONE SEEN

## 2022-02-05 LAB — BLADDER SCAN AMB NON-IMAGING: Scan Result: >151

## 2022-02-05 MED ORDER — TAMSULOSIN HCL 0.4 MG PO CAPS: 0.8000 mg | ORAL_CAPSULE | Freq: Every day | ORAL | 0 refills | Status: DC

## 2022-02-05 NOTE — Progress Notes (Signed)
02/05/2022 10:14 AM   Jason Holmes 1950-11-04 [id number]  CC: Chief Complaint  Patient presents with   Follow-up   HPI: Jason Holmes is a 71 y.o. male with unfavorable intermediate risk prostate cancer who underwent brachyseed placement with Dr. Diamantina Providence 9 days ago who developed postop gross hematuria/urinary retention and passed a voiding trial with me earlier this week who presents today for evaluation of possible urinary retention.   Today he reports urinary frequency, weak stream, and straining since Foley catheter removal on Monday.  He does not have any abdominal distention or bladder pain.  He already takes Flomax 0.4 mg daily and tolerates it well without orthostasis.  In-office UA today positive for 1+ blood; urine microscopy with 11-30 RBCs/HPF. PVR >130m.  PMH: Past Medical History:  Diagnosis Date   Anemia    Elevated PSA    Elevated TSH    Erectile dysfunction    Excessive drinking of alcohol    GERD (gastroesophageal reflux disease)    Hyperlipidemia    Hypertension    Peptic ulcer    Prostate cancer ([place]    Sciatica    right    Surgical History: Past Surgical History:  Procedure Laterality Date   COLONOSCOPY N/A 02/24/2017   Procedure: COLONOSCOPY;  Surgeon: VLin Landsman MD;  Location: [facility]  Service: Endoscopy;  Laterality: N/A;   NASAL FRACTURE SURGERY  2005   PROSTATE BIOPSY     PROSTATE BIOPSY N/A 03/05/2020   Procedure: BIOPSY TRANSRECTAL ULTRASONIC PROSTATE (TUBP);  Surgeon: SAbbie Sons MD;  Location: [facility];  Service: Urology;  Laterality: N/A;   PROSTATE BIOPSY N/A 11/04/2021   Procedure: PROSTATE BIOPSY;  Surgeon: SAbbie Sons MD;  Location: [facility];  Service: Urology;  Laterality: N/A;   RADIOACTIVE SEED IMPLANT N/A 01/27/2022   Procedure: RADIOACTIVE SEED IMPLANT/BRACHYTHERAPY IMPLANT/CYSTOSCOPY/ BLADDER BIOPSY/FULGERATION;  Surgeon: SBilley Co MD;  Location: [facility];  Service: Urology;   Laterality: N/A;   TONSILLECTOMY  1972   TRANSRECTAL ULTRASOUND N/A 11/04/2021   Procedure: TRANSRECTAL ULTRASOUND;  Surgeon: SAbbie Sons MD;  Location: [facility];  Service: Urology;  Laterality: N/A;    Home Medications:  Allergies as of 02/05/2022       Reactions   Lovastatin Other (See Comments)   I         Medication List        Accurate as of February 05, 2022 10:14 AM. If you have any questions, ask your nurse or doctor.          CALCIUM PO Take 1,100 mg by mouth daily at 6 (six) AM.   diphenhydrAMINE 25 MG tablet Commonly known as: BENADRYL Take 25 mg by mouth at bedtime.   GNP Glucosame Maximum Strength 1000 MG Tabs Generic drug: Glucosamine Sulfate Take 1,500 mg by mouth at bedtime.   hydrochlorothiazide 25 MG tablet Commonly known as: HYDRODIURIL TAKE 1 TABLET DAILY What changed: when to take this   ibuprofen 200 MG tablet Commonly known as: ADVIL Take 400 mg by mouth every 6 (six) hours as needed.   Multiple Vitamin tablet Take 1 tablet by mouth daily. Silver   omeprazole 40 MG capsule Commonly known as: PRILOSEC TAKE 1 CAPSULE DAILY What changed:  how to take this when to take this   OVER THE COUNTER MEDICATION Apply 1 application topically daily as needed (pain). leg and back pain relief  CBD cream   Salonpas 3.05-30-08 % Ptch Generic drug: Camphor-Menthol-Methyl Sal  Apply 1 patch topically daily as needed (Back pain).   SAW PALMETTO PO Take 1 tablet by mouth daily.   sildenafil 100 MG tablet Commonly known as: VIAGRA Take 1 tablet (100 mg total) by mouth daily as needed for erectile dysfunction.   simvastatin 20 MG tablet Commonly known as: ZOCOR TAKE 1 TABLET AT BEDTIME   tamsulosin 0.4 MG Caps capsule Commonly known as: FLOMAX Take 1 capsule (0.4 mg total) by mouth daily after supper. What changed: Another medication with the same name was added. Make sure you understand how and when to take each. Changed by: Debroah Loop, PA-C   tamsulosin 0.4 MG Caps capsule Commonly known as: FLOMAX Take 2 capsules (0.8 mg total) by mouth daily. What changed: You were already taking a medication with the same name, and this prescription was added. Make sure you understand how and when to take each. Changed by: Debroah Loop, PA-C   Turmeric 500 MG Caps Take 500 mg by mouth daily.        Allergies:  Allergies  Allergen Reactions   Lovastatin Other (See Comments)    I      Family History: Family History  Problem Relation Age of Onset   Heart attack Mother    Heart attack Father    Prostate cancer Neg Hx    Kidney cancer Neg Hx     Social History:   reports that he quit smoking about 19 years ago. His smoking use included cigarettes. His smokeless tobacco use includes chew. He reports current alcohol use of about 70.0 standard drinks of alcohol per week. He reports that he does not use drugs.  Physical Exam: BP 127/73   Pulse 66   Ht '5\' 6"'$  (1.676 m)   Wt 167 lb (75.8 kg)   BMI 26.95 kg/m   Constitutional:  Alert and oriented, no acute distress, nontoxic appearing HEENT: [place], AT Cardiovascular: No clubbing, cyanosis, or edema Respiratory: Normal respiratory effort, no increased work of breathing Skin: No rashes, bruises or suspicious lesions Neurologic: Grossly intact, no focal deficits, moving all 4 extremities Psychiatric: Normal mood and affect  Laboratory Data: Results for orders placed or performed in visit on 02/05/22  Microscopic Examination   Urine  Result Value Ref Range   WBC, UA 0-5 0 - 5 /hpf   RBC, Urine 11-30 (A) 0 - 2 /hpf   Epithelial Cells (non renal) 0-10 0 - 10 /hpf   Bacteria, UA None seen None seen/Few  Urinalysis, Complete  Result Value Ref Range   Specific Gravity, UA 1.015 1.005 - 1.030   pH, UA 7.5 5.0 - 7.5   Color, UA Yellow Yellow   Appearance Ur Clear Clear   Leukocytes,UA Negative Negative   Protein,UA Negative Negative/Trace   Glucose,  UA Negative Negative   Ketones, UA Negative Negative   RBC, UA 1+ (A) Negative   Bilirubin, UA Negative Negative   Urobilinogen, Ur 0.2 0.2 - 1.0 mg/dL   Nitrite, UA Negative Negative   Microscopic Examination See below:   Bladder Scan (Post Void Residual) in office  Result Value Ref Range   Scan Result >151 ml    Assessment & Plan:   1. Benign prostatic hyperplasia (BPH) with straining on urination Urinary straining and frequency associated with incomplete emptying in the setting of recent brachyseed placement.  We discussed that this is likely secondary to some residual prostate inflammation and should resolve on its own.  In the interim, we will have him increase  Flomax to 0.8 mg daily for at least the next 2 weeks, up to a month if needed.  Can continue this even longer if needed based on symptoms.  He is in agreement with this plan.  UA today with microscopic hematuria consistent with recent prostate procedure.  No need to repeat UA. - Bladder Scan (Post Void Residual) in office - Urinalysis, Complete - tamsulosin (FLOMAX) 0.4 MG CAPS capsule; Take 2 capsules (0.8 mg total) by mouth daily.  Dispense: 60 capsule; Refill: 0  Return if symptoms worsen or fail to improve.  Debroah Loop, PA-C  [facility] [address] [place], [place] [address] [phone]

## 2022-02-23 DIAGNOSIS — L089 Local infection of the skin and subcutaneous tissue, unspecified: Secondary | ICD-10-CM | POA: Diagnosis not present

## 2022-02-23 DIAGNOSIS — D485 Neoplasm of uncertain behavior of skin: Secondary | ICD-10-CM | POA: Diagnosis not present

## 2022-02-23 DIAGNOSIS — C44311 Basal cell carcinoma of skin of nose: Secondary | ICD-10-CM | POA: Diagnosis not present

## 2022-02-23 DIAGNOSIS — B078 Other viral warts: Secondary | ICD-10-CM | POA: Diagnosis not present

## 2022-02-23 DIAGNOSIS — L570 Actinic keratosis: Secondary | ICD-10-CM | POA: Diagnosis not present

## 2022-02-24 ENCOUNTER — Ambulatory Visit
Admission: RE | Admit: 2022-02-24 | Discharge: 2022-02-24 | Disposition: A | Discharge status: Home / Self Care | Payer: Medicare HMO | Source: Ambulatory Visit | Attending: Radiation Oncology | Admitting: Radiation Oncology

## 2022-02-24 ENCOUNTER — Encounter: Payer: Self-pay | Admitting: Radiation Oncology

## 2022-02-24 ENCOUNTER — Other Ambulatory Visit: Payer: Self-pay | Admitting: *Deleted

## 2022-02-24 VITALS — BP 119/73 | HR 62 | Temp 97.4°F | Resp 18 | Wt 165.9 lb

## 2022-02-24 DIAGNOSIS — C61 Malignant neoplasm of prostate: Secondary | ICD-10-CM | POA: Diagnosis not present

## 2022-02-24 NOTE — Progress Notes (Signed)
Radiation Oncology Follow up Note  Name: Jason Holmes   Date:   02/24/2022 MRN:  [id number] DOB: 1951/01/20    This 71 y.o. male presents to the clinic today for 1 month follow-up status post I-125 interstitial implant for stage IIc adenocarcinoma the prostate.  REFERRING PROVIDER: Birdie Sons, MD  HPI: Patient is a 71 year old male now at 1 month having completed I-125 interstitial implant for a stage IIc adenocarcinoma of the prostate Gleason 6 presenting with a PSA of 6.8.  Seen today in routine follow-up he is doing well.  He had some initial problems with urination needed a Foley catheter right after surgery.  He still has some decreased stream nocturia x2-3.  No significant bowel problems..  COMPLICATIONS OF TREATMENT: none  FOLLOW UP COMPLIANCE: keeps appointments   PHYSICAL EXAM:  BP 119/73 (BP Location: Left Arm, Patient Position: Sitting, Cuff Size: Normal)   Pulse 62   Temp (!) 97.4 F (36.3 C) (Tympanic)   Resp 18   Wt 165 lb 14.4 oz (75.3 kg)   BMI 26.78 kg/m  Well-developed well-nourished patient in NAD. HEENT reveals PERLA, EOMI, discs not visualized.  Oral cavity is clear. No oral mucosal lesions are identified. Neck is clear without evidence of cervical or supraclavicular adenopathy. Lungs are clear to A&P. Cardiac examination is essentially unremarkable with regular rate and rhythm without murmur rub or thrill. Abdomen is benign with no organomegaly or masses noted. Motor sensory and DTR levels are equal and symmetric in the upper and lower extremities. Cranial nerves II through XII are grossly intact. Proprioception is intact. No peripheral adenopathy or edema is identified. No motor or sensory levels are noted. Crude visual fields are within normal range.  RADIOLOGY RESULTS: We did a CT scan for quality assurance showing excellent source placement.  PLAN: Present time patient is doing well.  I have informed him he is still radioactive causing some of these  urinary symptoms which will subside after another month or so.  I have asked to see him back in 3 months with a follow-up PSA at that time.  Patient knows to call with any concerns.  I would like to take this opportunity to thank you for allowing me to participate in the care of your patient.Noreene Filbert, MD

## 2022-03-02 ENCOUNTER — Ambulatory Visit: Payer: Medicare HMO | Admitting: Radiation Oncology

## 2022-03-03 ENCOUNTER — Other Ambulatory Visit: Payer: Self-pay | Admitting: Physician Assistant

## 2022-03-03 DIAGNOSIS — N401 Enlarged prostate with lower urinary tract symptoms: Secondary | ICD-10-CM

## 2022-03-09 DIAGNOSIS — Z191 Hormone sensitive malignancy status: Secondary | ICD-10-CM | POA: Diagnosis not present

## 2022-03-09 DIAGNOSIS — C61 Malignant neoplasm of prostate: Secondary | ICD-10-CM | POA: Diagnosis not present

## 2022-03-31 DIAGNOSIS — C44311 Basal cell carcinoma of skin of nose: Secondary | ICD-10-CM | POA: Diagnosis not present

## 2022-03-31 DIAGNOSIS — L578 Other skin changes due to chronic exposure to nonionizing radiation: Secondary | ICD-10-CM | POA: Diagnosis not present

## 2022-03-31 DIAGNOSIS — L814 Other melanin hyperpigmentation: Secondary | ICD-10-CM | POA: Diagnosis not present

## 2022-03-31 DIAGNOSIS — L988 Other specified disorders of the skin and subcutaneous tissue: Secondary | ICD-10-CM | POA: Diagnosis not present

## 2022-03-31 HISTORY — PX: MOHS SURGERY: SHX181

## 2022-04-02 ENCOUNTER — Encounter: Payer: Self-pay | Admitting: Family Medicine

## 2022-04-07 DIAGNOSIS — H524 Presbyopia: Secondary | ICD-10-CM | POA: Diagnosis not present

## 2022-04-07 DIAGNOSIS — H5203 Hypermetropia, bilateral: Secondary | ICD-10-CM | POA: Diagnosis not present

## 2022-04-07 DIAGNOSIS — H0102B Squamous blepharitis left eye, upper and lower eyelids: Secondary | ICD-10-CM | POA: Diagnosis not present

## 2022-04-07 DIAGNOSIS — H52223 Regular astigmatism, bilateral: Secondary | ICD-10-CM | POA: Diagnosis not present

## 2022-04-07 DIAGNOSIS — H0102A Squamous blepharitis right eye, upper and lower eyelids: Secondary | ICD-10-CM | POA: Diagnosis not present

## 2022-04-07 DIAGNOSIS — H40013 Open angle with borderline findings, low risk, bilateral: Secondary | ICD-10-CM | POA: Diagnosis not present

## 2022-04-07 DIAGNOSIS — L718 Other rosacea: Secondary | ICD-10-CM | POA: Diagnosis not present

## 2022-04-07 DIAGNOSIS — H0288A Meibomian gland dysfunction right eye, upper and lower eyelids: Secondary | ICD-10-CM | POA: Diagnosis not present

## 2022-04-07 DIAGNOSIS — H0288B Meibomian gland dysfunction left eye, upper and lower eyelids: Secondary | ICD-10-CM | POA: Diagnosis not present

## 2022-04-07 DIAGNOSIS — H25013 Cortical age-related cataract, bilateral: Secondary | ICD-10-CM | POA: Diagnosis not present

## 2022-04-17 DIAGNOSIS — Z0100 Encounter for examination of eyes and vision without abnormal findings: Secondary | ICD-10-CM | POA: Diagnosis not present

## 2022-04-22 ENCOUNTER — Other Ambulatory Visit: Payer: Medicare HMO

## 2022-05-06 ENCOUNTER — Ambulatory Visit: Payer: Medicare HMO | Admitting: Urology

## 2022-05-06 ENCOUNTER — Encounter: Payer: Self-pay | Admitting: Urology

## 2022-05-06 VITALS — BP 143/82 | HR 76 | Ht 70.0 in | Wt 163.0 lb

## 2022-05-06 DIAGNOSIS — C672 Malignant neoplasm of lateral wall of bladder: Secondary | ICD-10-CM | POA: Diagnosis not present

## 2022-05-06 DIAGNOSIS — Z8551 Personal history of malignant neoplasm of bladder: Secondary | ICD-10-CM

## 2022-05-06 LAB — URINALYSIS, COMPLETE
Bilirubin, UA: NEGATIVE
Glucose, UA: NEGATIVE
Ketones, UA: NEGATIVE
Leukocytes,UA: NEGATIVE
Nitrite, UA: NEGATIVE
Protein,UA: NEGATIVE
RBC, UA: NEGATIVE
Specific Gravity, UA: 1.01 (ref 1.005–1.030)
Urobilinogen, Ur: 0.2 mg/dL (ref 0.2–1.0)
pH, UA: 7 (ref 5.0–7.5)

## 2022-05-06 LAB — MICROSCOPIC EXAMINATION
Bacteria, UA: NONE SEEN
Epithelial Cells (non renal): NONE SEEN /hpf (ref 0–10)

## 2022-05-06 MED ORDER — FINASTERIDE 5 MG PO TABS: 5.0000 mg | ORAL_TABLET | Freq: Every day | ORAL | 2 refills | Status: DC

## 2022-05-06 NOTE — Progress Notes (Signed)
   05/06/22  CC:  Chief Complaint  Patient presents with   Cysto    HPI: 71 y.o. male who underwent brachytherapy 01/27/2022 and on post implant cystoscopy found to have a 1 cm papillary bladder tumor posterolateral to the left UO.  He had urinary retention post implant and is still having moderate obstructive voiding symptoms.  On tamsulosin 0.8 mg daily which has significantly improved his symptoms.  Blood pressure (!) 143/82, pulse 76, height '5\' 10"'$  (1.778 m), weight 163 lb (73.9 kg). NED. A&Ox3.   No respiratory distress   Abd soft, NT, ND Normal phallus with bilateral descended testicles  Cystoscopy Procedure Note  Patient identification was confirmed, informed consent was obtained, and patient was prepped using Betadine solution.  Lidocaine jelly was administered per urethral meatus.     Pre-Procedure: - Inspection reveals a normal caliber urethral meatus.  Procedure: The flexible cystoscope was introduced without difficulty - No urethral strictures/lesions are present. -  Prominent lateral lobe enlargement with hypervascularity  prostate  - Moderate elevation bladder neck - Bilateral ureteral orifices identified - Bladder mucosa  reveals no ulcers, tumors, or lesions - No bladder stones - No trabeculation  Retroflexion shows no abnormalities   Post-Procedure: - Patient tolerated the procedure well  Assessment/ Plan: No evidence of recurrent bladder tumor Prominent prostate enlargement Follow-up surveillance cystoscopy 9 months Finasteride 5 mg daily Instructed to call earlier for worsening voiding symptoms    [name], MD

## 2022-05-17 ENCOUNTER — Other Ambulatory Visit: Payer: Self-pay | Admitting: Urology

## 2022-05-17 ENCOUNTER — Other Ambulatory Visit: Payer: Self-pay | Admitting: Family Medicine

## 2022-05-17 DIAGNOSIS — N401 Enlarged prostate with lower urinary tract symptoms: Secondary | ICD-10-CM

## 2022-05-19 NOTE — Telephone Encounter (Signed)
Please advise if appropriate for pt to continue Flomax.

## 2022-05-20 ENCOUNTER — Inpatient Hospital Stay: Payer: Medicare HMO | Attending: Radiation Oncology

## 2022-05-20 DIAGNOSIS — C61 Malignant neoplasm of prostate: Secondary | ICD-10-CM | POA: Diagnosis not present

## 2022-05-20 LAB — PSA: Prostatic Specific Antigen: 0.01 ng/mL (ref 0.00–4.00)

## 2022-05-24 ENCOUNTER — Encounter: Payer: Self-pay | Admitting: Family Medicine

## 2022-05-24 DIAGNOSIS — N529 Male erectile dysfunction, unspecified: Secondary | ICD-10-CM

## 2022-05-27 ENCOUNTER — Ambulatory Visit
Admission: RE | Admit: 2022-05-27 | Discharge: 2022-05-27 | Disposition: A | Discharge status: Home / Self Care | Payer: Medicare HMO | Source: Ambulatory Visit | Attending: Radiation Oncology | Admitting: Radiation Oncology

## 2022-05-27 ENCOUNTER — Other Ambulatory Visit: Payer: Self-pay | Admitting: *Deleted

## 2022-05-27 VITALS — BP 121/80 | HR 78 | Resp 15 | Ht 66.0 in | Wt 170.6 lb

## 2022-05-27 DIAGNOSIS — Z923 Personal history of irradiation: Secondary | ICD-10-CM | POA: Diagnosis not present

## 2022-05-27 DIAGNOSIS — R232 Flushing: Secondary | ICD-10-CM | POA: Insufficient documentation

## 2022-05-27 DIAGNOSIS — Z191 Hormone sensitive malignancy status: Secondary | ICD-10-CM | POA: Diagnosis not present

## 2022-05-27 DIAGNOSIS — C61 Malignant neoplasm of prostate: Secondary | ICD-10-CM

## 2022-05-27 MED ORDER — SILDENAFIL CITRATE 100 MG PO TABS: 100.0000 mg | ORAL_TABLET | Freq: Every day | ORAL | 3 refills | Status: DC | PRN

## 2022-05-27 NOTE — Progress Notes (Signed)
Radiation Oncology Follow up Note  Name: Jason Holmes   Date:   05/27/2022 MRN:  [id number] DOB: 07-Jul-1950    This 72 y.o. male presents to the clinic today for 58-monthfollow-up status post I-125 interstitial implant for stage IIc adenocarcinoma the prostate Gleason 6 presenting with a PSA of 6.8.  REFERRING PROVIDER: FBirdie Sons MD  HPI: [name].  Seen today in routine follow-up he is doing fairly well does have some continued problems with poor stream does take Flomax twice a day recently has been put on.  Finasteride by Dr. SBernardo Heater  He is having no GI problems.  Does have hot flashes and low libido most likely secondary to his ADT therapy.  His most recent PSA is 0.01 showing excellent biochemical control of his prostate cancer  COMPLICATIONS OF TREATMENT: none  FOLLOW UP COMPLIANCE: keeps appointments   PHYSICAL EXAM:  BP 121/80   Pulse 78   Resp 15   Ht '5\' 6"'$  (1.676 m)   Wt 170 lb 9.6 oz (77.4 kg)   BMI 27.54 kg/m  Well-developed well-nourished patient in NAD. HEENT reveals PERLA, EOMI, discs not visualized.  Oral cavity is clear. No oral mucosal lesions are identified. Neck is clear without evidence of cervical or supraclavicular adenopathy. Lungs are clear to A&P. Cardiac examination is essentially unremarkable with regular rate and rhythm without murmur rub or thrill. Abdomen is benign with no organomegaly or masses noted. Motor sensory and DTR levels are equal and symmetric in the upper and lower extremities. Cranial nerves II through XII are grossly intact. Proprioception is intact. No peripheral adenopathy or edema is identified. No motor or sensory levels are noted. Crude visual fields are within normal range.  RADIOLOGY RESULTS: No current films for review  PLAN: Present time patient is doing well under excellent biochemical  control of his prostate cancer.  On pleased with his overall progress.  He will continue follow-up with urology for his urologic symptoms.  I have suggested vitamin E for his hot flashes.  I have asked to see him back in 6 months with a repeat PSA.  Patient is to call with any concerns.  I would like to take this opportunity to thank you for allowing me to participate in the care of your patient..Noreene Filbert MD

## 2022-05-27 NOTE — Telephone Encounter (Deleted)
[email or web address]

## 2022-05-28 DIAGNOSIS — R69 Illness, unspecified: Secondary | ICD-10-CM | POA: Diagnosis not present

## 2022-06-05 ENCOUNTER — Encounter: Payer: Self-pay | Admitting: Urology

## 2022-06-29 DIAGNOSIS — R69 Illness, unspecified: Secondary | ICD-10-CM | POA: Diagnosis not present

## 2022-07-05 DIAGNOSIS — R69 Illness, unspecified: Secondary | ICD-10-CM | POA: Diagnosis not present

## 2022-07-07 ENCOUNTER — Other Ambulatory Visit: Payer: Medicare HMO

## 2022-07-10 ENCOUNTER — Ambulatory Visit: Payer: Medicare HMO | Admitting: Urology

## 2022-07-17 ENCOUNTER — Ambulatory Visit: Payer: Medicare HMO | Admitting: Physician Assistant

## 2022-07-17 DIAGNOSIS — N401 Enlarged prostate with lower urinary tract symptoms: Secondary | ICD-10-CM | POA: Diagnosis not present

## 2022-07-17 DIAGNOSIS — R6882 Decreased libido: Secondary | ICD-10-CM | POA: Diagnosis not present

## 2022-07-17 DIAGNOSIS — N138 Other obstructive and reflux uropathy: Secondary | ICD-10-CM | POA: Diagnosis not present

## 2022-07-17 DIAGNOSIS — R69 Illness, unspecified: Secondary | ICD-10-CM | POA: Diagnosis not present

## 2022-07-17 NOTE — Progress Notes (Signed)
Virtual Visit via Telephone Note  I connected with [name] on 07/17/22 at 11:30 AM EST by telephone and verified that I am speaking with the correct person using two identifiers.  Location: Patient: Home in [place], [place] Provider: Clinic in [place], [place]   I discussed the limitations, risks, security and privacy concerns of performing an evaluation and management service by telephone and the availability of in person appointments. I also discussed with the patient that there may be a patient responsible charge related to this service. The patient expressed understanding and agreed to proceed.  History of Present Illness: Jason Holmes is a 72 y.o. 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  He is accompanied today by his wife, who contributes to HPI.  He has a history of postoperative urinary retention after undergoing brachytherapy implant and bladder biopsy with fulguration in September 2023.  I previously had him increase Flomax to 0.8 mg daily for some obstructive symptoms he was having following brachytherapy and Dr. Bernardo Heater started him on finasteride 3 months ago after cystoscopy revealed prominent lateral lobe enlargement of the prostate with hypervascularity.  Today he reports concerns regarding the intended duration of finasteride therapy as well as some side effects that he has read about, namely erectile dysfunction, loss of libido, and high-grade prostate cancer.  He has enjoyed an active sex life with his wife and has noticed loss of libido over the past several months.  He does not wish to continue finasteride indefinitely if he can reasonably expect this to continue.  He wonders about alternative treatment options for his enlarged prostate that would allow him to discontinue finasteride.  Additionally, he notes  that he has decreased his Flomax to once daily and is doing well with this.   Observations/Objective: Asking appropriate questions, in no apparent distress.  Assessment and Plan: 1. Benign prostatic hyperplasia with urinary obstruction We had a lengthy conversation about finasteride therapy.  We discussed that this is typically an indefinite treatment and that it can cause erectile dysfunction, however the connection with high-grade prostate cancers is felt to be due to correlation rather than causation.  I am not concerned that finasteride is contributing to his prostate cancer and I feel that it would be safe for him to continue from the prostate cancer perspective.  That said, if he does not wish to risk erectile dysfunction due to finasteride, we may consider outlet procedures, however I am unsure what options are available to him given his brachytherapy and would need to discuss this further with Dr. Bernardo Heater.  I will discuss this with him and reach back out to the patient and his wife once I know more.  They are in agreement with this plan.  In the meantime, he will continue daily finasteride and Flomax 0.4 mg daily.  2. Loss of libido We discussed that ADT and finasteride can cause loss of libido and erectile dysfunction, with ADT being the more likely offender.  That said, it would be reasonable to discontinue finasteride if this is an intolerable side effect for him.  We discussed that he can expect persistent loss of libido for the next several months, as his 80-monthEligard injection was administered about 6 months ago, however it may take several months for his hormone levels to rebound.  He expressed understanding.  Follow Up Instructions: Return for Will contact  patient to discuss bladder outlet options.    I discussed the assessment and treatment plan with the patient. The patient was provided an opportunity to ask questions and all were answered. The patient agreed with the plan and  demonstrated an understanding of the instructions.   The patient was advised to call back or seek an in-person evaluation if the symptoms worsen or if the condition fails to improve as anticipated.  I provided 35 minutes of non-face-to-face time during this encounter.  Debroah Loop, PA-C

## 2022-07-21 DIAGNOSIS — R69 Illness, unspecified: Secondary | ICD-10-CM | POA: Diagnosis not present

## 2022-07-24 ENCOUNTER — Telehealth: Payer: Self-pay | Admitting: Physician Assistant

## 2022-07-24 DIAGNOSIS — N401 Enlarged prostate with lower urinary tract symptoms: Secondary | ICD-10-CM

## 2022-07-24 NOTE — Telephone Encounter (Signed)
I spoke with Dr. Bernardo Heater regarding this patient and his interest in pursuing outlet procedures.  With his history of brachytherapy for management of prostate cancer, we discussed that most respective outlet procedures would carry a high risk of urinary incontinence.  Dr. Bernardo Heater recommended consideration of PAE, which we anticipate will allow the patient to discontinue at least finasteride but likely tamsulosin as well.  I spoke with the patient and his wife via telephone and they are interested in pursuing PAE.  Referral placed today to interventional radiology.

## 2022-07-27 ENCOUNTER — Other Ambulatory Visit: Payer: Self-pay | Admitting: Physician Assistant

## 2022-07-27 DIAGNOSIS — N138 Other obstructive and reflux uropathy: Secondary | ICD-10-CM

## 2022-07-30 ENCOUNTER — Other Ambulatory Visit: Payer: Self-pay | Admitting: Interventional Radiology

## 2022-07-30 ENCOUNTER — Ambulatory Visit
Admission: RE | Admit: 2022-07-30 | Discharge: 2022-07-30 | Disposition: A | Discharge status: Home / Self Care | Payer: Medicare HMO | Source: Ambulatory Visit | Attending: Physician Assistant | Admitting: Physician Assistant

## 2022-07-30 DIAGNOSIS — N401 Enlarged prostate with lower urinary tract symptoms: Secondary | ICD-10-CM | POA: Diagnosis not present

## 2022-07-30 DIAGNOSIS — N138 Other obstructive and reflux uropathy: Secondary | ICD-10-CM

## 2022-07-30 HISTORY — PX: IR RADIOLOGIST EVAL & MGMT: IMG5224

## 2022-07-30 NOTE — Consult Note (Signed)
Chief Complaint: Patient was seen in consultation today for BPH with severe lower urinary tract symptoms at the request of [place]  Referring Physician(s): Vaillancourt,Samantha  History of Present Illness: Jason Holmes is a 72 y.o. male With a history of prostate cancer status post brachytherapy treatment.  His most recent PSAs indicate a complete response to therapy with no concern for residual disease.  Unfortunately, over the past year he has developed significant lower urinary tract symptoms in the setting of benign prostatic hyperplasia.  He is currently on both tamsulosin and finasteride.  Unfortunately, the finasteride has had a detrimental effect on his libido which he finds quite distressing.  He filled out the international prostate symptom severity score in our office.  He scored 27 out of 35 on the symptom severity score consistent with severe symptoms.  Additionally, his quality of life is significantly impacted as he scored a 4 out of 6 and reports that he would be mostly dissatisfied if he did not get symptom relief.  Otherwise, he is in relatively good health and has no other complaints.  Past Medical History:  Diagnosis Date   Anemia    Basal cell carcinoma (BCC) of left nasal sidewall 2023   MOHS Surgery [facility] 03/31/2022   Elevated PSA    Elevated TSH    Erectile dysfunction    Excessive drinking of alcohol    GERD (gastroesophageal reflux disease)    Peptic ulcer    Prostate cancer [facility])    Sciatica    right    Past Surgical History:  Procedure Laterality Date   COLONOSCOPY N/A 02/24/2017   Procedure: COLONOSCOPY;  Surgeon: Lin Landsman, MD;  Location: [place];  Service: Endoscopy;  Laterality: N/A;   IR RADIOLOGIST EVAL & MGMT  07/30/2022   MOHS SURGERY Left 03/31/2022   [facility]   NASAL FRACTURE SURGERY  2005   PROSTATE BIOPSY     PROSTATE BIOPSY N/A 03/05/2020   Procedure: BIOPSY TRANSRECTAL ULTRASONIC PROSTATE (TUBP);   Surgeon: [name], MD;  Location: [facility];  Service: Urology;  Laterality: N/A;   PROSTATE BIOPSY N/A 11/04/2021   Procedure: PROSTATE BIOPSY;  Surgeon: [name], MD;  Location: [facility];  Service: Urology;  Laterality: N/A;   RADIOACTIVE SEED IMPLANT N/A 01/27/2022   Procedure: RADIOACTIVE SEED IMPLANT/BRACHYTHERAPY IMPLANT/CYSTOSCOPY/ BLADDER BIOPSY/FULGERATION;  Surgeon: Billey Co, MD;  Location: [facility];  Service: Urology;  Laterality: N/A;   TONSILLECTOMY  1972   TRANSRECTAL ULTRASOUND N/A 11/04/2021   Procedure: TRANSRECTAL ULTRASOUND;  Surgeon: [name], MD;  Location: [facility];  Service: Urology;  Laterality: N/A;    Allergies: Patient has no active allergies.  Medications: Prior to Admission medications   Medication Sig Start Date End Date Taking? Authorizing Provider  CALCIUM PO Take 1,100 mg by mouth daily at 6 (six) AM.    [provider]  Camphor-Menthol-Methyl Sal (SALONPAS) 3.05-30-08 % PTCH Apply 1 patch topically daily as needed (Back pain).    [provider]  diphenhydrAMINE (BENADRYL) 25 MG tablet Take 25 mg by mouth at bedtime.     [provider]  finasteride (PROSCAR) 5 MG tablet Take 1 tablet (5 mg total) by mouth daily. 05/06/22   Stoioff, Ronda Fairly, MD  Glucosamine Sulfate (GNP GLUCOSAME MAXIMUM STRENGTH) 1000 MG TABS Take 1,500 mg by mouth at bedtime.     [provider]  hydrochlorothiazide (HYDRODIURIL) 25 MG tablet TAKE 1 TABLET DAILY Patient taking differently: Take 25  mg by mouth every morning. 01/02/22   Birdie Sons, MD  ibuprofen (ADVIL) 200 MG tablet Take 400 mg by mouth every 6 (six) hours as needed.    [provider]  Multiple Vitamin tablet Take 1 tablet by mouth daily. Silver    [provider]  omeprazole (PRILOSEC) 40 MG capsule TAKE 1 CAPSULE DAILY 05/20/22   Birdie Sons, MD  OVER THE COUNTER MEDICATION Apply 1 application topically daily as needed (pain).  leg and back pain relief  CBD cream    [provider]  sildenafil (VIAGRA) 100 MG tablet Take 1 tablet (100 mg total) by mouth daily as needed for erectile dysfunction. 05/27/22   Birdie Sons, MD  simvastatin (ZOCOR) 20 MG tablet TAKE 1 TABLET AT BEDTIME 01/02/22   Birdie Sons, MD  tamsulosin (FLOMAX) 0.4 MG CAPS capsule Take 2 capsules (0.8 mg total) by mouth daily. 05/19/22   Vaillancourt, Aldona Bar, PA-C  Turmeric 500 MG CAPS Take 500 mg by mouth daily.    [provider]     Family History  Problem Relation Age of Onset   Heart attack Mother    Heart attack Father    Prostate cancer Neg Hx    Kidney cancer Neg Hx     Social History   Socioeconomic History   Marital status: Married    Spouse name: Not on file   Number of children: Not on file   Years of education: Not on file   Highest education level: Not on file  Occupational History   Not on file  Tobacco Use   Smoking status: Former    Types: Cigarettes    Quit date: 01/30/2003    Years since quitting: 19.5   Smokeless tobacco: Current    Types: Chew  Vaping Use   Vaping Use: Never used  Substance and Sexual Activity   Alcohol use: Yes    Alcohol/week: 70.0 standard drinks of alcohol    Types: 70 Cans of beer per week    Comment: 8-10 drinks per day   Drug use: No   Sexual activity: Yes    Birth control/protection: None  Other Topics Concern   Not on file  Social History Narrative   Not on file   Social Determinants of Health   Financial Resource Strain: Not on file  Food Insecurity: Not on file  Transportation Needs: Not on file  Physical Activity: Not on file  Stress: Not on file  Social Connections: Not on file   Review of Systems: A 12 point ROS discussed and pertinent positives are indicated in the HPI above.  All other systems are negative.  Review of Systems  Vital Signs: BP 104/62 (BP Location: Left Arm, Patient Position: Sitting, Cuff Size: Normal)   Pulse 78    Temp 98 F (36.7 C) (Oral)   Resp 14   Wt 73.9 kg   SpO2 99%   BMI 26.31 kg/m    Physical Exam Constitutional:      Appearance: Normal appearance.  HENT:     Head: Normocephalic and atraumatic.  Eyes:     General: No scleral icterus. Cardiovascular:     Rate and Rhythm: Normal rate.  Pulmonary:     Effort: Pulmonary effort is normal.  Abdominal:     General: Abdomen is flat. There is no distension.     Palpations: Abdomen is soft.  Skin:    General: Skin is warm and dry.  Neurological:  Mental Status: He is alert and oriented to person, place, and time.  Psychiatric:        Mood and Affect: Mood normal.        Behavior: Behavior normal.       Imaging: IR Radiologist Eval & Mgmt  Result Date: 07/30/2022 EXAM: NEW PATIENT OFFICE VISIT CHIEF COMPLAINT: SEE EPIC NOTE HISTORY OF PRESENT ILLNESS: SEE EPIC NOTE REVIEW OF SYSTEMS: SEE EPIC NOTE PHYSICAL EXAMINATION: SEE EPIC NOTE ASSESSMENT AND PLAN: SEE EPIC NOTE Electronically Signed   By: Jacqulynn Cadet M.D.   On: 07/30/2022 11:28    Labs:  CBC: Recent Labs    11/03/21 1117  WBC 5.5  HGB 12.5*  HCT 37.6*  PLT 305    COAGS: No results for input(s): "INR", "APTT" in the last 8760 hours.  BMP: Recent Labs    11/03/21 1117 01/20/22 1124  NA 135 134*  K 3.7 3.9  CL 99 97*  CO2 28 30  GLUCOSE 91 88  BUN 8 10  CALCIUM 10.5* 10.5*  CREATININE 0.72 0.84  GFRNONAA >60 >60    LIVER FUNCTION TESTS: No results for input(s): "BILITOT", "AST", "ALT", "ALKPHOS", "PROT", "ALBUMIN" in the last 8760 hours.  TUMOR MARKERS: No results for input(s): "AFPTM", "CEA", "CA199", "CHROMGRNA" in the last 8760 hours.  Assessment and Plan:  Very pleasant 72 year old gentleman with a history of prostate cancer status post brachytherapy and now significant benign prostatic hyperplasia with severe lower urinary tract symptoms.  He scored a 27 out of 35 on the international prostate symptom score which is consistent with  severe symptoms.  Additionally, his quality of life is quite impacted as he scored a 4 on the quality of life score (mostly dissatisfied).  He is an excellent candidate for prostatic artery embolization.  I believe this procedure would offer him a significant improvement in his lower urinary tract symptoms and quality of life with minimal to no risk of incontinence or worsening erectile dysfunction.  I was able to explain the procedure in detail to Jason Holmes and his wife and answered all of their questions.  At this time they are very interested but would like to go home and discuss together before making a decision.  I support their thoughtful approach.  1.) Jason Holmes will call back if he would like to proceed.  If we proceed, we will order a CTA of the pelvis for pre-operative planning.  The procedure would then be scheduled at [facility] to be performed by me.   Thank you for this interesting consult.  I greatly enjoyed meeting Jason Holmes and look forward to participating in their care.  A copy of this report was sent to the requesting provider on this date.  Electronically Signed: Criselda Peaches 07/30/2022, 11:36 AM   I spent a total of 40 Minutes  in face to face in clinical consultation, greater than 50% of which was counseling/coordinating care for BPH with LUTS

## 2022-08-04 ENCOUNTER — Ambulatory Visit
Admission: RE | Admit: 2022-08-04 | Discharge: 2022-08-04 | Disposition: A | Discharge status: Home / Self Care | Payer: Medicare HMO | Source: Ambulatory Visit | Attending: Interventional Radiology | Admitting: Interventional Radiology

## 2022-08-04 DIAGNOSIS — Z01818 Encounter for other preprocedural examination: Secondary | ICD-10-CM | POA: Diagnosis not present

## 2022-08-04 DIAGNOSIS — K5730 Diverticulosis of large intestine without perforation or abscess without bleeding: Secondary | ICD-10-CM | POA: Diagnosis not present

## 2022-08-04 DIAGNOSIS — N401 Enlarged prostate with lower urinary tract symptoms: Secondary | ICD-10-CM

## 2022-08-04 DIAGNOSIS — N138 Other obstructive and reflux uropathy: Secondary | ICD-10-CM

## 2022-08-04 MED ORDER — IOPAMIDOL (ISOVUE-370) INJECTION 76%
75.0000 mL | Freq: Once | INTRAVENOUS | Status: AC | PRN
  Administered 2022-08-04: 75 mL via INTRAVENOUS

## 2022-08-05 ENCOUNTER — Other Ambulatory Visit: Payer: Self-pay | Admitting: Interventional Radiology

## 2022-08-05 DIAGNOSIS — N401 Enlarged prostate with lower urinary tract symptoms: Secondary | ICD-10-CM

## 2022-08-16 DIAGNOSIS — R69 Illness, unspecified: Secondary | ICD-10-CM | POA: Diagnosis not present

## 2022-08-19 ENCOUNTER — Encounter: Payer: Self-pay | Admitting: Family Medicine

## 2022-08-19 ENCOUNTER — Other Ambulatory Visit: Payer: Self-pay

## 2022-08-19 ENCOUNTER — Encounter (HOSPITAL_COMMUNITY): Payer: Self-pay

## 2022-08-19 ENCOUNTER — Emergency Department (HOSPITAL_COMMUNITY)
Admission: EM | Admit: 2022-08-19 | Discharge: 2022-08-19 | Disposition: A | Discharge status: Home / Self Care | Payer: Medicare HMO | Attending: Emergency Medicine | Admitting: Emergency Medicine

## 2022-08-19 DIAGNOSIS — I160 Hypertensive urgency: Secondary | ICD-10-CM | POA: Diagnosis not present

## 2022-08-19 DIAGNOSIS — E876 Hypokalemia: Secondary | ICD-10-CM | POA: Diagnosis not present

## 2022-08-19 DIAGNOSIS — Z79899 Other long term (current) drug therapy: Secondary | ICD-10-CM | POA: Diagnosis not present

## 2022-08-19 DIAGNOSIS — Z743 Need for continuous supervision: Secondary | ICD-10-CM | POA: Diagnosis not present

## 2022-08-19 DIAGNOSIS — I10 Essential (primary) hypertension: Secondary | ICD-10-CM | POA: Diagnosis not present

## 2022-08-19 LAB — CBC WITH DIFFERENTIAL/PLATELET
Abs Immature Granulocytes: 0.01 10*3/uL (ref 0.00–0.07)
Basophils Absolute: 0 10*3/uL (ref 0.0–0.1)
Basophils Relative: 1 %
Eosinophils Absolute: 0.2 10*3/uL (ref 0.0–0.5)
Eosinophils Relative: 3 %
HCT: 30.3 % — ABNORMAL LOW (ref 39.0–52.0)
Hemoglobin: 10.3 g/dL — ABNORMAL LOW (ref 13.0–17.0)
Immature Granulocytes: 0 %
Lymphocytes Relative: 10 %
Lymphs Abs: 0.6 10*3/uL — ABNORMAL LOW (ref 0.7–4.0)
MCH: 29.9 pg (ref 26.0–34.0)
MCHC: 34 g/dL (ref 30.0–36.0)
MCV: 88.1 fL (ref 80.0–100.0)
Monocytes Absolute: 0.6 10*3/uL (ref 0.1–1.0)
Monocytes Relative: 10 %
Neutro Abs: 4 10*3/uL (ref 1.7–7.7)
Neutrophils Relative %: 76 %
Platelets: 240 10*3/uL (ref 150–400)
RBC: 3.44 MIL/uL — ABNORMAL LOW (ref 4.22–5.81)
RDW: 13.3 % (ref 11.5–15.5)
WBC: 5.4 10*3/uL (ref 4.0–10.5)
nRBC: 0 % (ref 0.0–0.2)

## 2022-08-19 LAB — BASIC METABOLIC PANEL
Anion gap: 10 (ref 5–15)
BUN: 15 mg/dL (ref 8–23)
CO2: 26 mmol/L (ref 22–32)
Calcium: 9.9 mg/dL (ref 8.9–10.3)
Chloride: 95 mmol/L — ABNORMAL LOW (ref 98–111)
Creatinine, Ser: 0.68 mg/dL (ref 0.61–1.24)
GFR, Estimated: >60 mL/min (ref >60)
Glucose, Bld: 107 mg/dL — ABNORMAL HIGH (ref 70–99)
Potassium: 3.2 mmol/L — ABNORMAL LOW (ref 3.5–5.1)
Sodium: 131 mmol/L — ABNORMAL LOW (ref 135–145)

## 2022-08-19 MED ORDER — POTASSIUM CHLORIDE CRYS ER 20 MEQ PO TBCR
40.0000 meq | EXTENDED_RELEASE_TABLET | Freq: Once | ORAL | Status: AC
  Administered 2022-08-19: 40 meq via ORAL
  Filled 2022-08-19: qty 2

## 2022-08-19 NOTE — ED Provider Notes (Signed)
[place] EMERGENCY DEPARTMENT AT [facility] Provider Note   CSN: [id number] Arrival date & time: 08/19/22  0049     History  Chief Complaint  Patient presents with   Hypertension    Jason Holmes is a 72 y.o. male.  Patient with history of hypertension presents today with complaints of hypertension. He states that same has been ongoing since Saturday. He takes HCTZ for same. States that on Friday he was out talking to a friend and suddenly he got sweaty and lightheaded and had tunnel vision and thought he was going to pass out. He went inside and checked his blood pressure and noted that it was in the 0000000 systolic. He laid down for a little while after and then felt better, however given his low blood pressure readings he skipped his evening dose of HCTZ. He restarted his normal regimen Saturday and has not missed any other doses. He has been monitoring his readings regularly since Saturday and has noticed that they have been high. He states his blood pressure is normally 120s/80s. Pressures since Saturday have been in the 180s. He took an additional HCTZ today. He states that otherwise he has been in good health and denies headache, chest pain, shortness of breath, nausea, vomiting, diarrhea, abdominal pain, or dysuria. He has not reached out to his primary care doctor.  The history is provided by the patient. No language interpreter was used.  Hypertension       Home Medications Prior to Admission medications   Medication Sig Start Date End Date Taking? Authorizing Provider  CALCIUM PO Take 1,100 mg by mouth daily at 6 (six) AM.    [provider]  Camphor-Menthol-Methyl Sal (SALONPAS) 3.05-30-08 % PTCH Apply 1 patch topically daily as needed (Back pain).    [provider]  diphenhydrAMINE (BENADRYL) 25 MG tablet Take 25 mg by mouth at bedtime.     [provider]  finasteride (PROSCAR) 5 MG tablet Take 1 tablet (5 mg total) by mouth daily.  05/06/22   Stoioff, Ronda Fairly, MD  Glucosamine Sulfate (GNP GLUCOSAME MAXIMUM STRENGTH) 1000 MG TABS Take 1,500 mg by mouth at bedtime.     [provider]  hydrochlorothiazide (HYDRODIURIL) 25 MG tablet TAKE 1 TABLET DAILY Patient taking differently: Take 25 mg by mouth every morning. 01/02/22   Birdie Sons, MD  ibuprofen (ADVIL) 200 MG tablet Take 400 mg by mouth every 6 (six) hours as needed.    [provider]  Multiple Vitamin tablet Take 1 tablet by mouth daily. Silver    [provider]  omeprazole (PRILOSEC) 40 MG capsule TAKE 1 CAPSULE DAILY 05/20/22   Birdie Sons, MD  OVER THE COUNTER MEDICATION Apply 1 application topically daily as needed (pain). leg and back pain relief  CBD cream    [provider]  sildenafil (VIAGRA) 100 MG tablet Take 1 tablet (100 mg total) by mouth daily as needed for erectile dysfunction. 05/27/22   Birdie Sons, MD  simvastatin (ZOCOR) 20 MG tablet TAKE 1 TABLET AT BEDTIME 01/02/22   Birdie Sons, MD  tamsulosin (FLOMAX) 0.4 MG CAPS capsule Take 2 capsules (0.8 mg total) by mouth daily. 05/19/22   Vaillancourt, Aldona Bar, PA-C  Turmeric 500 MG CAPS Take 500 mg by mouth daily.    [provider]      Allergies    Patient has no known allergies.    Review of Systems   Review of Systems  All other systems reviewed and are negative.   Physical Exam Updated Vital Signs BP (!) 161/92 (BP Location: Right Arm)   Pulse 68   Temp 98.4 F (36.9 C) (Oral)   Resp 16   Ht 5\' 6"  (1.676 m)   Wt 74.8 kg   SpO2 97%   BMI 26.63 kg/m  Physical Exam Vitals and nursing note reviewed.  Constitutional:      General: He is not in acute distress.    Appearance: Normal appearance. He is normal weight. He is not ill-appearing, toxic-appearing or diaphoretic.  HENT:     Head: Normocephalic and atraumatic.  Eyes:     Extraocular Movements: Extraocular movements intact.     Pupils: Pupils are equal, round, and  reactive to light.  Cardiovascular:     Rate and Rhythm: Normal rate and regular rhythm.     Heart sounds: Normal heart sounds.  Pulmonary:     Effort: Pulmonary effort is normal. No respiratory distress.     Breath sounds: Normal breath sounds.  Abdominal:     General: Abdomen is flat.     Palpations: Abdomen is soft.  Musculoskeletal:        General: No tenderness. Normal range of motion.     Cervical back: Normal range of motion.     Right lower leg: No edema.     Left lower leg: No edema.  Skin:    General: Skin is warm and dry.  Neurological:     General: No focal deficit present.     Mental Status: He is alert and oriented to person, place, and time.  Psychiatric:        Mood and Affect: Mood normal.        Behavior: Behavior normal.     ED Results / Procedures / Treatments   Labs (all labs ordered are listed, but only abnormal results are displayed) Labs Reviewed  CBC WITH DIFFERENTIAL/PLATELET - Abnormal; Notable for the following components:      Result Value   RBC 3.44 (*)    Hemoglobin 10.3 (*)    HCT 30.3 (*)    Lymphs Abs 0.6 (*)    All other components within normal limits  BASIC METABOLIC PANEL - Abnormal; Notable for the following components:   Sodium 131 (*)    Potassium 3.2 (*)    Chloride 95 (*)    Glucose, Bld 107 (*)    All other components within normal limits    EKG None  Radiology No results found.  Procedures Procedures    Medications Ordered in ED Medications  potassium chloride SA (KLOR-CON M) CR tablet 40 mEq (40 mEq Oral Given 08/19/22 0303)    ED Course/ Medical Decision Making/ A&P                             Medical Decision Making Amount and/or Complexity of Data Reviewed Labs: ordered.  Risk Prescription drug management.   This patient is a 72 y.o. male who presents to the ED for concern of hypertension, this involves an extensive number of treatment options, and is a complaint that carries with it a high risk of  complications and morbidity. The emergent differential diagnosis prior to evaluation includes, but is not limited to, sepsis, CVA, hypertensive urgency, hypertensive emergency. This is not an exhaustive differential.   Past Medical History / Co-morbidities / Social History: history of hypertension  Physical Exam: Physical exam performed. The  pertinent findings include: no pertinent physical exam findings  Lab Tests: I ordered, and personally interpreted labs.  The pertinent results include:  hgb 10.3, Na 131, K 3.2, chloride 95   Medications: I ordered medication including oral potassium  for hypokalemia. Reevaluation of the patient after these medicines showed that the patient stayed the same. I have reviewed the patients home medicines and have made adjustments as needed.   Disposition:  Patient presents today with complaints of asymptomatic hypertension. He is afebrile, nontoxic-appearing, and in no acute distress with reassuring vital signs. Physical exam benign. Patient is alert and oriented and neurologically intact without focal deficits. Laboratory evaluation reassuring. Does have low potassium, likely due to HCTZ use. Upon reevaluation, patients blood pressure is 135/91 without intervention. Evaluation and diagnostic testing in the emergency department does not suggest an emergent condition requiring admission or immediate intervention beyond what has been performed at this time.  Plan for discharge with close PCP follow-up and outpatient blood pressure monitoring.  Patient is understanding and amenable with plan, educated on red flag symptoms that would prompt immediate return.  Patient discharged in stable condition.   I discussed this case with my attending physician Dr. Randal Buba who cosigned this note including patient's presenting symptoms, physical exam, and planned diagnostics and interventions. Attending physician stated agreement with plan or made changes to plan which were  implemented.     Final Clinical Impression(s) / ED Diagnoses Final diagnoses:  Asymptomatic hypertension    Rx / DC Orders ED Discharge Orders     None     An After Visit Summary was printed and given to the patient.     Nestor Lewandowsky 08/19/22 0400    Palumbo, April, MD 08/19/22 [phone]

## 2022-08-19 NOTE — ED Triage Notes (Signed)
Patient brought in by [facility], reports high BP at home since yesterday ([id number]  systolic at home). States he takes HCTZ, a few days ago he had episode of low BP and near syncopal episode while standing. States he made it into his house and BP was [id number] systolic, skipped HCTZ that night. Denies any symptoms such as dizziness, headache currently.

## 2022-08-19 NOTE — Discharge Instructions (Signed)
As we discussed, your work-up in the ER was reassuring for acute findings. Laboratory evaluation did not reveal any emergent concerns and your blood pressure has been stable this evening. I recommend that you call your pcp for follow-up tomorrow to discuss management of your blood pressure moving forward. Continue to monitor your readings at home and write them down to bring to your pcp appointment.  Return if development of any new or worsening symptoms

## 2022-08-19 NOTE — Telephone Encounter (Signed)
Called patient to offer appointment with [facility] tomorrow, No answer, LMTCB

## 2022-08-19 NOTE — ED Notes (Signed)
Patient given discharge instructions and follow up care. Patient verbalized understanding. IV removed with catheter intact. Patient ambulatory out of ED. 

## 2022-08-20 ENCOUNTER — Ambulatory Visit (INDEPENDENT_AMBULATORY_CARE_PROVIDER_SITE_OTHER): Payer: Medicare HMO | Admitting: Physician Assistant

## 2022-08-20 ENCOUNTER — Telehealth: Payer: Self-pay

## 2022-08-20 VITALS — BP 111/75 | HR 67 | Temp 98.6°F | Wt 169.0 lb

## 2022-08-20 DIAGNOSIS — I10 Essential (primary) hypertension: Secondary | ICD-10-CM | POA: Diagnosis not present

## 2022-08-20 NOTE — Telephone Encounter (Signed)
        Patient  visited [facility]  3/27 Telephone encounter attempt :  1st  A HIPAA compliant voice message was left requesting a return call.  Instructed patient to call back .    [facility] [phone] [address], [place], [place] [address] Phone: [phone] Email: [email or web address]

## 2022-08-20 NOTE — Progress Notes (Unsigned)
Argentina Ponder [place],acting as a Education administrator for [employer], PA-C.,have documented all relevant documentation on the behalf of [name], PA-C,as directed by  [employer], PA-C while in the presence of [employer], PA-C.     Established patient visit   Patient: Jason Holmes   DOB: 08-06-1950   72 y.o. Male  MRN: [id number] Visit Date: 08/20/2022  Today's healthcare provider: [name], PA-C   CC: ER visit FU, BP FU  Subjective    HPI  Patienit is a 72 year old male who presents for follow up after ER visit for hypertension.   Patient has been getting readnigs at home of [phone] over 60-90 He has been having normal BP levels AM , dropping BP levels around afternoon and elevated BP PM after 9PM. Around 9pm, he has been having salty popcorn for many years. No changes in diet, environment, stress   Medications: Outpatient Medications Prior to Visit  Medication Sig   CALCIUM PO Take 1,100 mg by mouth daily at 6 (six) AM.   Camphor-Menthol-Methyl Sal (SALONPAS) 3.05-30-08 % PTCH Apply 1 patch topically daily as needed (Back pain).   diphenhydrAMINE (BENADRYL) 25 MG tablet Take 25 mg by mouth at bedtime.    finasteride (PROSCAR) 5 MG tablet Take 1 tablet (5 mg total) by mouth daily.   Glucosamine Sulfate (GNP GLUCOSAME MAXIMUM STRENGTH) 1000 MG TABS Take 1,500 mg by mouth at bedtime.    hydrochlorothiazide (HYDRODIURIL) 25 MG tablet TAKE 1 TABLET DAILY (Patient taking differently: Take 25 mg by mouth every morning.)   ibuprofen (ADVIL) 200 MG tablet Take 400 mg by mouth every 6 (six) hours as needed.   Multiple Vitamin tablet Take 1 tablet by mouth daily. Silver   omeprazole (PRILOSEC) 40 MG capsule TAKE 1 CAPSULE DAILY   OVER THE COUNTER MEDICATION Apply 1 application topically daily as needed (pain). leg and back pain relief  CBD cream   sildenafil (VIAGRA) 100 MG tablet Take 1 tablet (100 mg total) by mouth daily as needed for erectile dysfunction.   simvastatin (ZOCOR) 20  MG tablet TAKE 1 TABLET AT BEDTIME   tamsulosin (FLOMAX) 0.4 MG CAPS capsule Take 2 capsules (0.8 mg total) by mouth daily.   Turmeric 500 MG CAPS Take 500 mg by mouth daily.   No facility-administered medications prior to visit.    Review of Systems  Respiratory:  Negative for shortness of breath.   Cardiovascular:  Negative for chest pain, palpitations and leg swelling.  Gastrointestinal:  Negative for blood in stool, constipation and diarrhea.  Neurological:  Negative for weakness.    {Labs  Heme  Chem  Endocrine  Serology  Results Review (optional):23779}   Objective    BP 111/75 (BP Location: Right Arm, Patient Position: Sitting, Cuff Size: Normal)   Pulse 67   Temp 98.6 F (37 C) (Oral)   Wt 169 lb (76.7 kg)   SpO2 100%   BMI 27.28 kg/m  {Show previous vital signs (optional):23777}  Physical Exam Vitals and nursing note reviewed.  Constitutional:      General: He is not in acute distress.    Appearance: Normal appearance. He is not diaphoretic.  HENT:     Head: Normocephalic and atraumatic.     Right Ear: Tympanic membrane, ear canal and external ear normal.     Left Ear: Tympanic membrane, ear canal and external ear normal.     Nose: Nose normal.     Mouth/Throat:     Mouth: Mucous membranes  are moist.     Pharynx: Oropharynx is clear.  Eyes:     General: No scleral icterus.    Extraocular Movements: Extraocular movements intact.     Conjunctiva/sclera: Conjunctivae normal.     Pupils: Pupils are equal, round, and reactive to light.  Cardiovascular:     Rate and Rhythm: Normal rate and regular rhythm.     Pulses: Normal pulses.     Heart sounds: Normal heart sounds. No murmur heard. Pulmonary:     Effort: Pulmonary effort is normal. No respiratory distress.     Breath sounds: Normal breath sounds. No wheezing or rhonchi.  Abdominal:     General: Abdomen is flat. Bowel sounds are normal.     Palpations: Abdomen is soft.  Musculoskeletal:         General: Normal range of motion.     Cervical back: Normal range of motion and neck supple.     Right lower leg: No edema.     Left lower leg: No edema.  Lymphadenopathy:     Cervical: No cervical adenopathy.  Skin:    General: Skin is warm and dry.     Capillary Refill: Capillary refill takes less than 2 seconds.     Findings: No rash.  Neurological:     Mental Status: He is alert and oriented to person, place, and time. Mental status is at baseline.     Cranial Nerves: No cranial nerve deficit.     Sensory: No sensory deficit.     Motor: No weakness.     Coordination: Coordination normal.     Gait: Gait normal.     Deep Tendon Reflexes: Reflexes normal.  Psychiatric:        Mood and Affect: Mood normal.        Behavior: Behavior normal.        Thought Content: Thought content normal.        Judgment: Judgment normal.      No results found for any visits on 08/20/22.  Assessment & Plan     1. Essential (primary) hypertension Chronic and unstable Unclear etiology Advised to restrict sodium intake Ambulatory monitoring for 2 weeks Fu with PCP in 2 weeks Continue HCTZ 12.5 mg daily    No follow-ups on file.  The patient was advised to call back or seek an in-person evaluation if the symptoms worsen or if the condition fails to improve as anticipated.  I discussed the assessment and treatment plan with the patient. The patient was provided an opportunity to ask questions and all were answered. The patient agreed with the plan and demonstrated an understanding of the instructions.  I, [name], PA-C have reviewed all documentation for this visit. The documentation on  08/20/22 for the exam, diagnosis, procedures, and orders are all accurate and complete.  [name], [facility], [place] [phone] (phone) [phone] (fax)   [place]

## 2022-08-21 ENCOUNTER — Telehealth: Payer: Self-pay

## 2022-08-21 NOTE — Telephone Encounter (Signed)
        Patient  visited [place] on 3/27  Telephone encounter attempt :  2nd  A HIPAA compliant voice message was left requesting a return call.  Instructed patient to call back.    [facility] [phone] [address], [place], [place] [address] Phone: [phone] Email: [email or web address]

## 2022-08-22 ENCOUNTER — Encounter: Payer: Self-pay | Admitting: Physician Assistant

## 2022-08-25 NOTE — Progress Notes (Signed)
Bradley Ferris sent to Dorthula Perfect, Durenda Hurt, RT; [place], [place] Name: Kalyan Scavetta  [birth year]  Procedure: PAE with Dr Laurence Ferrari  Reason for procedure: Benign Prostatic hyperplasia with lower urinary tract symptoms  Relevant History: In EPIC  Order Physician: [place] Contact: Clent Ridges  Ins: Isac Sarna req'd per online ref# [id number] 9:00am 08/05/22

## 2022-09-01 ENCOUNTER — Other Ambulatory Visit: Payer: Self-pay | Admitting: Student

## 2022-09-01 DIAGNOSIS — C61 Malignant neoplasm of prostate: Secondary | ICD-10-CM

## 2022-09-01 NOTE — Progress Notes (Signed)
Patient for IR PAE on Wed 09/02/2022, I called and spoke with the patient on the phone and gave pre-procedure instructions. Pt was made aware to be here at 7:30a, NPO after MN prior to procedure as well as driver post procedure/recovery/discharge. Pt stated understanding.  Called  08/25/2022 and 09/01/2022

## 2022-09-02 ENCOUNTER — Inpatient Hospital Stay: Payer: Medicare HMO | Admitting: Family Medicine

## 2022-09-02 ENCOUNTER — Other Ambulatory Visit: Payer: Self-pay

## 2022-09-02 ENCOUNTER — Ambulatory Visit
Admission: RE | Admit: 2022-09-02 | Discharge: 2022-09-02 | Disposition: A | Discharge status: Home / Self Care | Payer: Medicare HMO | Source: Ambulatory Visit | Attending: Interventional Radiology | Admitting: Interventional Radiology

## 2022-09-02 DIAGNOSIS — D649 Anemia, unspecified: Secondary | ICD-10-CM | POA: Diagnosis not present

## 2022-09-02 DIAGNOSIS — C61 Malignant neoplasm of prostate: Secondary | ICD-10-CM | POA: Diagnosis not present

## 2022-09-02 DIAGNOSIS — N400 Enlarged prostate without lower urinary tract symptoms: Secondary | ICD-10-CM | POA: Diagnosis not present

## 2022-09-02 DIAGNOSIS — K219 Gastro-esophageal reflux disease without esophagitis: Secondary | ICD-10-CM | POA: Diagnosis not present

## 2022-09-02 DIAGNOSIS — N401 Enlarged prostate with lower urinary tract symptoms: Secondary | ICD-10-CM

## 2022-09-02 HISTORY — PX: IR EMBO TUMOR ORGAN ISCHEMIA INFARCT INC GUIDE ROADMAPPING: IMG5449

## 2022-09-02 HISTORY — PX: IR EMBO ARTERIAL NOT HEMORR HEMANG INC GUIDE ROADMAPPING: IMG5448

## 2022-09-02 LAB — BASIC METABOLIC PANEL
Anion gap: 10 (ref 5–15)
BUN: 10 mg/dL (ref 8–23)
CO2: 28 mmol/L (ref 22–32)
Calcium: 10.5 mg/dL — ABNORMAL HIGH (ref 8.9–10.3)
Chloride: 101 mmol/L (ref 98–111)
Creatinine, Ser: 0.68 mg/dL (ref 0.61–1.24)
GFR, Estimated: >60 mL/min (ref >60)
Glucose, Bld: 94 mg/dL (ref 70–99)
Potassium: 3.6 mmol/L (ref 3.5–5.1)
Sodium: 139 mmol/L (ref 135–145)

## 2022-09-02 LAB — PSA: Prostatic Specific Antigen: <0.01 ng/mL (ref 0.00–4.00)

## 2022-09-02 LAB — CBC
HCT: 35.6 % — ABNORMAL LOW (ref 39.0–52.0)
Hemoglobin: 11.6 g/dL — ABNORMAL LOW (ref 13.0–17.0)
MCH: 29.2 pg (ref 26.0–34.0)
MCHC: 32.6 g/dL (ref 30.0–36.0)
MCV: 89.7 fL (ref 80.0–100.0)
Platelets: 297 10*3/uL (ref 150–400)
RBC: 3.97 MIL/uL — ABNORMAL LOW (ref 4.22–5.81)
RDW: 13.3 % (ref 11.5–15.5)
WBC: 4.7 10*3/uL (ref 4.0–10.5)
nRBC: 0 % (ref 0.0–0.2)

## 2022-09-02 LAB — PROTIME-INR
INR: 1 (ref 0.8–1.2)
Prothrombin Time: 13.3 seconds (ref 11.4–15.2)

## 2022-09-02 MED ORDER — FENTANYL CITRATE (PF) 100 MCG/2ML IJ SOLN
INTRAMUSCULAR | Status: AC
  Filled 2022-09-02: qty 2

## 2022-09-02 MED ORDER — DEXAMETHASONE SODIUM PHOSPHATE 10 MG/ML IJ SOLN
8.0000 mg | Freq: Once | INTRAMUSCULAR | Status: AC
  Administered 2022-09-02: 8 mg via INTRAVENOUS
  Filled 2022-09-02: qty 0.8

## 2022-09-02 MED ORDER — MIDAZOLAM HCL 2 MG/2ML IJ SOLN
INTRAMUSCULAR | Status: AC | PRN
  Administered 2022-09-02: 1 mg via INTRAVENOUS

## 2022-09-02 MED ORDER — MIDAZOLAM HCL 2 MG/2ML IJ SOLN
INTRAMUSCULAR | Status: AC
  Filled 2022-09-02: qty 4

## 2022-09-02 MED ORDER — SODIUM CHLORIDE 0.9 % IV SOLN: INTRAVENOUS | Status: DC

## 2022-09-02 MED ORDER — ATROPINE SULFATE 1 MG/10ML IJ SOSY
PREFILLED_SYRINGE | INTRAMUSCULAR | Status: AC
  Filled 2022-09-02: qty 20

## 2022-09-02 MED ORDER — FENTANYL CITRATE (PF) 100 MCG/2ML IJ SOLN
INTRAMUSCULAR | Status: AC | PRN
  Administered 2022-09-02: 50 ug via INTRAVENOUS

## 2022-09-02 MED ORDER — NAPROXEN 500 MG PO TABS: 500.0000 mg | ORAL_TABLET | Freq: Two times a day (BID) | ORAL | 0 refills | Status: AC

## 2022-09-02 MED ORDER — CEFAZOLIN SODIUM-DEXTROSE 2-4 GM/100ML-% IV SOLN
2.0000 g | INTRAVENOUS | Status: AC
  Administered 2022-09-02: 2 g via INTRAVENOUS
  Filled 2022-09-02: qty 100

## 2022-09-02 MED ORDER — LIDOCAINE HCL 1 % IJ SOLN
INTRAMUSCULAR | Status: AC
  Filled 2022-09-02: qty 20

## 2022-09-02 MED ORDER — IOHEXOL 300 MG/ML  SOLN
220.0000 mL | Freq: Once | INTRAMUSCULAR | Status: AC | PRN
  Administered 2022-09-02: 220 mL via INTRA_ARTERIAL

## 2022-09-02 MED ORDER — NITROGLYCERIN 1 MG/10 ML FOR IR/CATH LAB
INTRA_ARTERIAL | Status: AC | PRN
  Administered 2022-09-02 (×2): 200 ug via INTRA_ARTERIAL

## 2022-09-02 MED ORDER — SULFAMETHOXAZOLE-TRIMETHOPRIM 400-80 MG PO TABS
1.0000 | ORAL_TABLET | Freq: Once | ORAL | Status: AC
  Administered 2022-09-02: 1 via ORAL
  Filled 2022-09-02: qty 1

## 2022-09-02 MED ORDER — SULFAMETHOXAZOLE-TRIMETHOPRIM 400-80 MG PO TABS: 1.0000 | ORAL_TABLET | Freq: Two times a day (BID) | ORAL | 0 refills | Status: AC

## 2022-09-02 MED ORDER — OXYCODONE-ACETAMINOPHEN 7.5-325 MG PO TABS: 1.0000 | ORAL_TABLET | Freq: Four times a day (QID) | ORAL | 0 refills | Status: AC | PRN

## 2022-09-02 MED ORDER — FENTANYL CITRATE (PF) 100 MCG/2ML IJ SOLN
INTRAMUSCULAR | Status: AC | PRN
  Administered 2022-09-02: 25 ug via INTRAVENOUS

## 2022-09-02 MED ORDER — LIDOCAINE HCL 1 % IJ SOLN
10.0000 mL | Freq: Once | INTRAMUSCULAR | Status: AC
  Administered 2022-09-02: 10 mL via INTRADERMAL

## 2022-09-02 MED ORDER — SODIUM CHLORIDE 0.9 % IV BOLUS
250.0000 mL | Freq: Once | INTRAVENOUS | Status: AC
  Administered 2022-09-02: 250 mL via INTRAVENOUS

## 2022-09-02 MED ORDER — NAPROXEN 500 MG PO TABS
500.0000 mg | ORAL_TABLET | Freq: Once | ORAL | Status: AC
  Administered 2022-09-02: 500 mg via ORAL
  Filled 2022-09-02 (×2): qty 1

## 2022-09-02 MED ORDER — CEFAZOLIN SODIUM-DEXTROSE 2-4 GM/100ML-% IV SOLN
INTRAVENOUS | Status: AC
  Filled 2022-09-02: qty 100

## 2022-09-02 NOTE — Progress Notes (Signed)
Patient ID: Jason Holmes, male   DOB: Apr 26, 1951, 72 y.o.   MRN: [id number] Per order of Dr. Archer Asa, electronic prescription sent to pt's pharmacy for Percocet 7.5/325, 1 tablet every 6 hours as needed for mod-severe pain, #12, no refills

## 2022-09-02 NOTE — Progress Notes (Signed)
Dr. Archer Asa to bedside to assess pt and discuss procedure. Dr. Archer Asa assessed both groin sites and agreed with them being stable. MD went through and answered questions for pt and wife.

## 2022-09-02 NOTE — Progress Notes (Signed)
Pt. And wife decided to go home secondary to machine not ready yet for procedure. Pt. To remain NPO & return to hospital around noon today for possible 1:00 pm procedure.

## 2022-09-02 NOTE — Progress Notes (Signed)
Pt. Informed the machine for his procedure is "down" at present. Pt. And wife spoke with Susy Frizzle, PA-C at bedside & signed consent for possible procedure today.

## 2022-09-02 NOTE — Procedures (Signed)
Interventional Radiology Procedure Note  Procedure: Prostate artery embolization.   Complications: None  Estimated Blood Loss: None  Recommendations:  - Bedrest x 1 hr - DC home  Signed,  Sterling Big, MD

## 2022-09-02 NOTE — H&P (Signed)
Chief Complaint: Patient was seen in consultation today for BPH with severe lower urinary tract symptoms  Referring Physician(s): Carman Ching  Supervising Physician: Malachy Moan  Patient Status: [facility] - Out-pt  History of Present Illness: Jason Holmes is a 72 y.o. male with PMH significant for anemia, GERD, and prostate cancer s/p brachytherapy treatment. The patient has previously been seen by Dr Archer Asa in consultation for prostate artery embolization to address his BPH. The patient has been experiencing a large disease burden from his BPH and has been determined to be a candidate for prostate artery embolization.   Past Medical History:  Diagnosis Date   Anemia    Basal cell carcinoma (BCC) of left nasal sidewall 2023   MOHS [facility] 03/31/2022   Elevated PSA    Elevated TSH    Erectile dysfunction    Excessive drinking of alcohol    GERD (gastroesophageal reflux disease)    Peptic ulcer    Prostate cancer    Sciatica    right    Past Surgical History:  Procedure Laterality Date   COLONOSCOPY N/A 02/24/2017   Procedure: COLONOSCOPY;  Surgeon: Toney Reil, MD;  Location: [facility];  Service: Endoscopy;  Laterality: N/A;   IR RADIOLOGIST EVAL & MGMT  07/30/2022   MOHS SURGERY Left 03/31/2022   [facility]   NASAL FRACTURE SURGERY  2005   PROSTATE BIOPSY     PROSTATE BIOPSY N/A 03/05/2020   Procedure: BIOPSY TRANSRECTAL ULTRASONIC PROSTATE (TUBP);  Surgeon: Riki Altes, MD;  Location: [facility];  Service: Urology;  Laterality: N/A;   PROSTATE BIOPSY N/A 11/04/2021   Procedure: PROSTATE BIOPSY;  Surgeon: Riki Altes, MD;  Location: [facility];  Service: Urology;  Laterality: N/A;   RADIOACTIVE SEED IMPLANT N/A 01/27/2022   Procedure: RADIOACTIVE SEED IMPLANT/BRACHYTHERAPY IMPLANT/CYSTOSCOPY/ BLADDER BIOPSY/FULGERATION;  Surgeon: Sondra Come, MD;  Location: [facility] OR[facility];  Service: Urology;  Laterality: N/A;   TONSILLECTOMY  1972    TRANSRECTAL ULTRASOUND N/A 11/04/2021   Procedure: TRANSRECTAL ULTRASOUND;  Surgeon: Riki Altes, MD;  Location: [facility];  Service: Urology;  Laterality: N/A;    Allergies: Patient has no known allergies.  Medications: Prior to Admission medications   Medication Sig Start Date End Date Taking? Authorizing Provider  CALCIUM PO Take 1,100 mg by mouth daily at 6 (six) AM.    [provider]  Camphor-Menthol-Methyl Sal (SALONPAS) 3.05-30-08 % PTCH Apply 1 patch topically daily as needed (Back pain).    [provider]  diphenhydrAMINE (BENADRYL) 25 MG tablet Take 25 mg by mouth at bedtime.     [provider]  finasteride (PROSCAR) 5 MG tablet Take 1 tablet (5 mg total) by mouth daily. 05/06/22   Stoioff, Verna Czech, MD  Glucosamine Sulfate (GNP GLUCOSAME MAXIMUM STRENGTH) 1000 MG TABS Take 1,500 mg by mouth at bedtime.     [provider]  hydrochlorothiazide (HYDRODIURIL) 25 MG tablet TAKE 1 TABLET DAILY Patient taking differently: Take 25 mg by mouth every morning. 01/02/22   Malva Limes, MD  ibuprofen (ADVIL) 200 MG tablet Take 400 mg by mouth every 6 (six) hours as needed.    [provider]  Multiple Vitamin tablet Take 1 tablet by mouth daily. Silver    [provider]  omeprazole (PRILOSEC) 40 MG capsule TAKE 1 CAPSULE DAILY 05/20/22   Malva Limes, MD  OVER THE COUNTER MEDICATION Apply 1 application topically daily as needed (pain). leg and back pain relief  CBD cream    [provider]  sildenafil (VIAGRA) 100 MG tablet Take 1 tablet (100 mg total) by mouth daily as needed for erectile dysfunction. 05/27/22   Malva Limes, MD  simvastatin (ZOCOR) 20 MG tablet TAKE 1 TABLET AT BEDTIME 01/02/22   Malva Limes, MD  tamsulosin (FLOMAX) 0.4 MG CAPS capsule Take 2 capsules (0.8 mg total) by mouth daily. 05/19/22   Vaillancourt, Lelon Mast, PA-C  Turmeric 500 MG CAPS Take 500 mg by mouth daily.    [provider]     Family History  Problem Relation Age of Onset   Heart attack Mother    Heart attack Father    Prostate cancer Neg Hx    Kidney cancer Neg Hx     Social History   Socioeconomic History   Marital status: Married    Spouse name: Not on file   Number of children: Not on file   Years of education: Not on file   Highest education level: Bachelor's degree (e.g., BA, AB, BS)  Occupational History   Not on file  Tobacco Use   Smoking status: Former    Types: Cigarettes    Quit date: 01/30/2003    Years since quitting: 19.6   Smokeless tobacco: Current    Types: Chew  Vaping Use   Vaping Use: Never used  Substance and Sexual Activity   Alcohol use: Yes    Alcohol/week: 70.0 standard drinks of alcohol    Types: 70 Cans of beer per week    Comment: 8-10 drinks per day   Drug use: No   Sexual activity: Yes    Birth control/protection: None  Other Topics Concern   Not on file  Social History Narrative   Not on file   Social Determinants of Health   Financial Resource Strain: Low Risk  (08/31/2022)   Overall Financial Resource Strain (CARDIA)    Difficulty of Paying Living Expenses: Not hard at all  Food Insecurity: No Food Insecurity (08/31/2022)   Hunger Vital Sign    Worried About Running Out of Food in the Last Year: Never true    Ran Out of Food in the Last Year: Never true  Transportation Needs: No Transportation Needs (08/31/2022)   PRAPARE - Administrator, Civil Service (Medical): No    Lack of Transportation (Non-Medical): No  Physical Activity: Sufficiently Active (08/31/2022)   Exercise Vital Sign    Days of Exercise per Week: 5 days    Minutes of Exercise per Session: 30 min  Stress: No Stress Concern Present (08/31/2022)   [employer] of Occupational Health - Occupational Stress Questionnaire    Feeling of Stress : Not at all  Social Connections: Moderately Isolated (08/31/2022)   Social Connection and Isolation Panel [NHANES]     Frequency of Communication with Friends and Family: More than three times a week    Frequency of Social Gatherings with Friends and Family: Once a week    Attends Religious Services: Never    Database administrator or Organizations: No    Attends Engineer, structural: Not on file    Marital Status: Married    Code Status: Full Code  Review of Systems: A 12 point ROS discussed and pertinent positives are indicated in the HPI above.  All other systems are negative.  Review of Systems  Constitutional:  Negative for chills and fever.  Respiratory:  Negative for chest tightness and shortness of breath.  Cardiovascular:  Negative for chest pain and leg swelling.  Gastrointestinal:  Negative for abdominal pain, diarrhea, nausea and vomiting.  Neurological:  Negative for dizziness and headaches.  Psychiatric/Behavioral:  Negative for confusion.     Vital Signs: BP 132/79   Pulse 73   Temp (!) 97.3 F (36.3 C) (Oral)   Resp 20   Ht  (1.676 m)   Wt 163 lb 8 oz (74.2 kg)   SpO2 97%   BMI 26.39 kg/m    Physical Exam Vitals reviewed.  Constitutional:      General: He is not in acute distress.    Appearance: He is not ill-appearing.  HENT:     Mouth/Throat:     Mouth: Mucous membranes are moist.  Eyes:     Pupils: Pupils are equal, round, and reactive to light.  Cardiovascular:     Rate and Rhythm: Normal rate and regular rhythm.     Pulses: Normal pulses.     Heart sounds: Normal heart sounds.  Pulmonary:     Effort: Pulmonary effort is normal.     Breath sounds: Normal breath sounds.  Abdominal:     Palpations: Abdomen is soft.     Tenderness: There is no abdominal tenderness.  Musculoskeletal:     Right lower leg: No edema.     Left lower leg: No edema.  Neurological:     Mental Status: He is alert and oriented to person, place, and time.  Psychiatric:        Mood and Affect: Mood normal.        Behavior: Behavior normal.        Thought Content:  Thought content normal.        Judgment: Judgment normal.     Imaging: CT ANGIO PELVIS W OR WO CONTRAST  Result Date: 08/04/2022 CLINICAL DATA:  Pre - planning prostate artery embolization Benign hyperplasia prostate Hx prostate ca - XRT seeds Urinary retention EXAM: CT ANGIOGRAPHY OF THE PELVIS TECHNIQUE: Multidetector CT imaging of the pelvis was performed using the standard protocol following the bolus administration of intravenous contrast. Multiplanar image (3D post-processing) reconstructions and MIPs were obtained to evaluate the vascular anatomy. RADIATION DOSE REDUCTION: This exam was performed according to the departmental dose-optimization program which includes automated exposure control, adjustment of the mA and/or kV according to patient size and/or use of iterative reconstruction technique. CONTRAST:  75mL ISOVUE-370 IOPAMIDOL (ISOVUE-370) INJECTION 76% COMPARISON:  PET-CT, 11/20/2021. FINDINGS: Urinary Tract: Mild, relative urinary bladder wall thickening for degree of distention. Bowel: Moderate burden of sigmoid diverticulosis. Imaged portions of small bowel and colon are nonobstructed. Incidental colonic interposition of the RIGHT upper quadrant. Vascular/Lymphatic: *Mild aortoiliac atherosclerotic disease without aneurysmal dilation or hemodynamically-significant stenosis. *Moderate calcified atherosclerotic burden at the iliac artery bifurcations, greater on RIGHT. *Patent hypogastric arteries.  LEFT prostatic artery dominance. *Patent common femoral access sites, with mild burden of calcified and noncalcified atherosclerosis. *No pathologically enlarged lymph nodes. Reproductive: Prostatic volume difficult to ascertain given beam hardening and streak artifact from brachytherapy seeds. Measures approximately 4.0 x 4.5 x 4.3 cm (AP by transaxial by CC), and 39 mL in volume. Other: Small fat-containing RIGHT inguinal hernia versus cord lipoma. No pelvic ascites. Musculoskeletal:  Degenerative changes of the imaged lower lumbar spine, with disc space changes at L4-5 and L5-S1, and facet arthropathy. No acute osseous abnormality or suspicious bone lesion. IMPRESSION: 1. No acute vascular or nonvascular findings within the pelvis. 2. Prostate brachytherapy, with approximate volume of 40 mL.  3. Mild-to-moderate burden of calcified atherosclerosis at the iliac artery bifurcations, greater on RIGHT. Patent hypogastric arteries, with LEFT prostatic artery dominance. 4. Sigmoid diverticulosis. Additional incidental, chronic and senescent findings as above. Roanna BanningJon Mugweru, MD [facility] Electronically Signed   By: Roanna BanningJon  Mugweru M.D.   On: 08/04/2022 17:23    Labs:  CBC: Recent Labs    11/03/21 1117 08/19/22 0156  WBC 5.5 5.4  HGB 12.5* 10.3*  HCT 37.6* 30.3*  PLT 305 240    COAGS: No results for input(s): "INR", "APTT" in the last 8760 hours.  BMP: Recent Labs    11/03/21 1117 01/20/22 1124 08/19/22 0156  NA 135 134* 131*  K 3.7 3.9 3.2*  CL 99 97* 95*  CO2 28 30 26   GLUCOSE 91 88 107*  BUN 8 10 15   CALCIUM 10.5* 10.5* 9.9  CREATININE 0.72 0.84 0.68  GFRNONAA >60 >60 >60    LIVER FUNCTION TESTS: No results for input(s): "BILITOT", "AST", "ALT", "ALKPHOS", "PROT", "ALBUMIN" in the last 8760 hours.  TUMOR MARKERS: No results for input(s): "AFPTM", "CEA", "CA199", "CHROMGRNA" in the last 8760 hours.  Assessment and Plan:  Jason Holmes is a pleasant 72 yo male being seen today in relation to BPH. Patient has previously met with Dr Archer AsaMcCullough regarding  image-guided prostatic artery embolization. He has been evaluated to be a good candidate for this procedure and is agreeable to proceed. Patient presents today in his usual state of health and is NPO. Case has been reviewed with Dr Archer AsaMcCullough and is tentatively scheduled to proceed on 09/02/22.  The Risks and benefits of embolization were discussed with the  patient including, but not limited to bleeding, infection, vascular injury, post operative pain, or contrast induced renal failure.  This procedure involves the use of X-rays and because of the nature of the planned procedure, it is possible that we will have prolonged use of X-ray fluoroscopy.  Potential radiation risks to you include (but are not limited to) the following: - A slightly elevated risk for cancer several years later in life. This risk is typically less than 0.5% percent. This risk is low in comparison to the normal incidence of human cancer, which is 33% for women and 50% for men according to the American Cancer Society. - Radiation induced injury can include skin redness, resembling a rash, tissue breakdown / ulcers and hair loss (which can be temporary or permanent).   The likelihood of either of these occurring depends on the difficulty of the procedure and whether you are sensitive to radiation due to previous procedures, disease, or genetic conditions.   IF your procedure requires a prolonged use of radiation, you will be notified and given written instructions for further action.  It is your responsibility to monitor the irradiated area for the 2 weeks following the procedure and to notify your physician if you are concerned that you have suffered a radiation induced injury.    All of the patient's questions were answered, patient is agreeable to proceed. Consent signed and in chart.   Thank you for this interesting consult.  I greatly enjoyed meeting Genelle BalJames A Fraga and look forward to participating in their care.  A copy of this report was sent to the requesting provider on this date.  Electronically Signed: Kennieth FrancoisMatthew L Rourke Mcquitty, PA-C 09/02/2022, 7:39 AM   I spent a total of  25 Minutes in face to face in clinical consultation, greater than 50% of which was counseling/coordinating  care for benign prostatic hyperplasia.

## 2022-09-03 ENCOUNTER — Other Ambulatory Visit: Payer: Self-pay | Admitting: Interventional Radiology

## 2022-09-03 DIAGNOSIS — N401 Enlarged prostate with lower urinary tract symptoms: Secondary | ICD-10-CM

## 2022-09-04 ENCOUNTER — Encounter: Payer: Self-pay | Admitting: Family Medicine

## 2022-09-04 ENCOUNTER — Ambulatory Visit (INDEPENDENT_AMBULATORY_CARE_PROVIDER_SITE_OTHER): Payer: Medicare HMO | Admitting: Family Medicine

## 2022-09-04 VITALS — BP 107/62 | HR 80 | Ht 66.0 in | Wt 172.0 lb

## 2022-09-04 DIAGNOSIS — R0989 Other specified symptoms and signs involving the circulatory and respiratory systems: Secondary | ICD-10-CM

## 2022-09-04 DIAGNOSIS — D539 Nutritional anemia, unspecified: Secondary | ICD-10-CM | POA: Diagnosis not present

## 2022-09-04 NOTE — Progress Notes (Unsigned)
Established patient visit   Patient: Jason Holmes   DOB: July 16, 1950   72 y.o. Male  MRN: [id number] Visit Date: 09/04/2022  Today's healthcare provider: Mila Merry, MD   No chief complaint on file.  Subjective    HPI  Hypertension, follow-up  BP Readings from Last 3 Encounters:  09/04/22 107/62  09/02/22 (!) 158/95  08/20/22 111/75   Wt Readings from Last 3 Encounters:  09/04/22 172 lb (78 kg)  09/02/22 163 lb 8 oz (74.2 kg)  08/20/22 169 lb (76.7 kg)       Pertinent labs Lab Results  Component Value Date   CHOL 222 (H) 11/19/2021   HDL 98 11/19/2021   LDLCALC 112 (H) 11/19/2021   TRIG 71 11/19/2021   CHOLHDL 2.3 11/19/2021   Lab Results  Component Value Date   NA 139 09/02/2022   K 3.6 09/02/2022   CREATININE 0.68 09/02/2022   GFRNONAA >60 09/02/2022   GLUCOSE 94 09/02/2022   TSH 3.730 11/19/2021     Has had labile blood pressure over the last month. Had ER visit 3/27 while on hctz 25mg  when home sbp was in the 190s. Was noted to have low potassium and sodium in the ER and given supplement potassium.  Was seen here by Debera Lat 3/28 with some low home readings and hctz was reduced to 1/2 tablet daily. Is noted to be on tamsulosin for prostatism. Had been prescribed 2 a day last fall, but reduced to 1 a day a month or so ago.   He brings home BP readings today with most SBPs in 110's or 120s, although some in the 90s and some in the 140s. He did have follow up electrolytes as above and noted that sodium and potassium have normalized.   He was also noted to have a drop in his hemoglobin below his baseline.   Lab Results  Component Value Date   HGB 11.6 (L) 09/02/2022     ---------------------------------------------------------------------------------------------------   Medications: Outpatient Medications Prior to Visit  Medication Sig   CALCIUM PO Take 1,100 mg by mouth daily at 6 (six) AM.   Camphor-Menthol-Methyl Sal (SALONPAS)  3.05-30-08 % PTCH Apply 1 patch topically daily as needed (Back pain).   diphenhydrAMINE (BENADRYL) 25 MG tablet Take 25 mg by mouth at bedtime.    finasteride (PROSCAR) 5 MG tablet Take 1 tablet (5 mg total) by mouth daily.   Glucosamine Sulfate (GNP GLUCOSAME MAXIMUM STRENGTH) 1000 MG TABS Take 1,500 mg by mouth at bedtime.    hydrochlorothiazide (HYDRODIURIL) 25 MG tablet TAKE 1 TABLET DAILY (Patient taking differently: Take 25 mg by mouth at bedtime. 1/2 tab q hs)   ibuprofen (ADVIL) 200 MG tablet Take 400 mg by mouth every 6 (six) hours as needed.   Multiple Vitamin tablet Take 1 tablet by mouth daily. Silver   naproxen (NAPROSYN) 500 MG tablet Take 1 tablet (500 mg total) by mouth 2 (two) times daily with a meal for 7 days.   omeprazole (PRILOSEC) 40 MG capsule TAKE 1 CAPSULE DAILY   OVER THE COUNTER MEDICATION Apply 1 application topically daily as needed (pain). leg and back pain relief  CBD cream   oxyCODONE-acetaminophen (PERCOCET) 7.5-325 MG tablet Take 1 tablet by mouth every 6 (six) hours as needed for up to 3 days for severe pain.   sildenafil (VIAGRA) 100 MG tablet Take 1 tablet (100 mg total) by mouth daily as needed for erectile dysfunction.   simvastatin (ZOCOR) 20  MG tablet TAKE 1 TABLET AT BEDTIME   sulfamethoxazole-trimethoprim (BACTRIM) 400-80 MG tablet Take 1 tablet by mouth 2 (two) times daily for 3 days.   tamsulosin (FLOMAX) 0.4 MG CAPS capsule Take 2 capsules (0.8 mg total) by mouth daily. (Patient taking differently: Take 0.4 mg by mouth daily after supper.)   Turmeric 500 MG CAPS Take 500 mg by mouth daily.   No facility-administered medications prior to visit.    Review of Systems  Constitutional:  Negative for appetite change, chills and fever.  Respiratory:  Negative for chest tightness, shortness of breath and wheezing.   Cardiovascular:  Negative for chest pain and palpitations.  Gastrointestinal:  Positive for anal bleeding. Negative for abdominal pain,  nausea and vomiting.    {Labs  Heme  Chem  Endocrine  Serology  Results Review (optional):23779}   Objective    BP 107/62 (BP Location: Left Arm, Patient Position: Sitting, Cuff Size: Normal)   Pulse 80   Ht 5\' 6"  (1.676 m)   Wt 172 lb (78 kg)   SpO2 96%   BMI 27.76 kg/m  {Show previous vital signs (optional):23777}  Physical Exam   General: Appearance:    Well developed, well nourished male in no acute distress  Eyes:    PERRL, conjunctiva/corneas clear, EOM's intact       Lungs:     Clear to auscultation bilaterally, respirations unlabored  Heart:    Normal heart rate. Normal rhythm. No murmurs, rubs, or gallops.    MS:   All extremities are intact.    Neurologic:   Awake, alert, oriented x 3. No apparent focal neurological defect.         Assessment & Plan     1. Labile blood pressure Currently down to 1/2 hctz each day. Labile readings may related to tamsulosin or recent prostate procedure. Consider prn amlodipine if he continues to have unpredictably high readings.   2. Nutritional anemia  - Iron, TIBC and Ferritin Panel - Vitamin B12 - Reticulocytes         Mila Merry, MD  [facility] [phone] (phone) [phone] (fax)  [facility]

## 2022-09-05 ENCOUNTER — Other Ambulatory Visit: Payer: Self-pay | Admitting: Interventional Radiology

## 2022-09-05 DIAGNOSIS — R338 Other retention of urine: Secondary | ICD-10-CM

## 2022-09-05 LAB — IRON,TIBC AND FERRITIN PANEL
Ferritin: 72 ng/mL (ref 30–400)
Iron Saturation: 15 % (ref 15–55)
Iron: 58 ug/dL (ref 38–169)
Total Iron Binding Capacity: 384 ug/dL (ref 250–450)
UIBC: 326 ug/dL (ref 111–343)

## 2022-09-05 LAB — VITAMIN B12: Vitamin B-12: 979 pg/mL (ref 232–1245)

## 2022-09-05 LAB — RETICULOCYTES: Retic Ct Pct: 1.2 % (ref 0.6–2.6)

## 2022-09-08 ENCOUNTER — Encounter: Payer: Self-pay | Admitting: Family Medicine

## 2022-09-09 DIAGNOSIS — D2272 Melanocytic nevi of left lower limb, including hip: Secondary | ICD-10-CM | POA: Diagnosis not present

## 2022-09-09 DIAGNOSIS — Z09 Encounter for follow-up examination after completed treatment for conditions other than malignant neoplasm: Secondary | ICD-10-CM | POA: Diagnosis not present

## 2022-09-09 DIAGNOSIS — L570 Actinic keratosis: Secondary | ICD-10-CM | POA: Diagnosis not present

## 2022-09-09 DIAGNOSIS — Z872 Personal history of diseases of the skin and subcutaneous tissue: Secondary | ICD-10-CM | POA: Diagnosis not present

## 2022-09-09 DIAGNOSIS — D2261 Melanocytic nevi of right upper limb, including shoulder: Secondary | ICD-10-CM | POA: Diagnosis not present

## 2022-09-09 DIAGNOSIS — D225 Melanocytic nevi of trunk: Secondary | ICD-10-CM | POA: Diagnosis not present

## 2022-09-09 DIAGNOSIS — Z85828 Personal history of other malignant neoplasm of skin: Secondary | ICD-10-CM | POA: Diagnosis not present

## 2022-10-01 ENCOUNTER — Ambulatory Visit
Admission: RE | Admit: 2022-10-01 | Discharge: 2022-10-01 | Disposition: A | Discharge status: Home / Self Care | Payer: Medicare HMO | Source: Ambulatory Visit | Attending: Interventional Radiology | Admitting: Interventional Radiology

## 2022-10-01 DIAGNOSIS — N401 Enlarged prostate with lower urinary tract symptoms: Secondary | ICD-10-CM | POA: Diagnosis not present

## 2022-10-01 DIAGNOSIS — R338 Other retention of urine: Secondary | ICD-10-CM | POA: Diagnosis not present

## 2022-10-01 HISTORY — PX: IR RADIOLOGIST EVAL & MGMT: IMG5224

## 2022-10-01 NOTE — Progress Notes (Signed)
Chief Complaint: Patient was seen in consultation today for BPH with LUTS at the request of Mirjana Tarleton K  Referring Physician(s): Carman Ching   History of Present Illness: Jason Holmes is a 72 y.o. male With a history of prostate cancer status post brachytherapy treatment.  His most recent PSAs indicate a complete response to therapy with no concern for residual disease.  Unfortunately, over the past year he had developed significant lower urinary tract symptoms in the setting of benign prostatic hyperplasia.  He was on both tamsulosin and finasteride.  Unfortunately, the finasteride has had a detrimental effect on his libido which he finds quite distressing.   He filled out the international prostate symptom severity score in our office.  He scored 27 out of 35 on the symptom severity score consistent with severe symptoms.  Additionally, his quality of life is significantly impacted as he scored a 4 out of 6 and reports that he would be mostly dissatisfied if he did not get symptom relief.   He underwent prostatic artery embolization on 09/02/2022.  He presents today with his wife for our 2-week follow-up evaluation.  He reports that his post treatment course was uneventful.  He had no significant prostatic or penile pain, and no dysuria.  He is currently doing exceptionally well.  He reports that he is "peeing like a horse" and has stopped taking his Flomax.  He has no complaints at this time and is extremely happy with his outcome thus far.    Past Medical History:  Diagnosis Date   Anemia    Basal cell carcinoma (BCC) of left nasal sidewall 2023   MOHS Surgery [facility] 03/31/2022   Elevated PSA    Elevated TSH    Erectile dysfunction    Excessive drinking of alcohol    GERD (gastroesophageal reflux disease)    Peptic ulcer    Prostate cancer [facility])    Sciatica    right    Past Surgical History:  Procedure Laterality Date   COLONOSCOPY N/A 02/24/2017    Procedure: COLONOSCOPY;  Surgeon: Toney Reil, MD;  Location: [facility];  Service: Endoscopy;  Laterality: N/A;   IR EMBO TUMOR ORGAN ISCHEMIA INFARCT INC GUIDE ROADMAPPING  09/02/2022   IR RADIOLOGIST EVAL & MGMT  07/30/2022   IR RADIOLOGIST EVAL & MGMT  10/01/2022   MOHS SURGERY Left 03/31/2022   [facility]   NASAL FRACTURE SURGERY  2005   PROSTATE BIOPSY     PROSTATE BIOPSY N/A 03/05/2020   Procedure: BIOPSY TRANSRECTAL ULTRASONIC PROSTATE (TUBP);  Surgeon: Riki Altes, MD;  Location: [facility] ORS;  Service: Urology;  Laterality: N/A;   PROSTATE BIOPSY N/A 11/04/2021   Procedure: PROSTATE BIOPSY;  Surgeon: Riki Altes, MD;  Location: [facility];  Service: Urology;  Laterality: N/A;   RADIOACTIVE SEED IMPLANT N/A 01/27/2022   Procedure: RADIOACTIVE SEED IMPLANT/BRACHYTHERAPY IMPLANT/CYSTOSCOPY/ BLADDER BIOPSY/FULGERATION;  Surgeon: Sondra Come, MD;  Location: [facility] OR[facility];  Service: Urology;  Laterality: N/A;   TONSILLECTOMY  1972   TRANSRECTAL ULTRASOUND N/A 11/04/2021   Procedure: TRANSRECTAL ULTRASOUND;  Surgeon: Riki Altes, MD;  Location: [facility];  Service: Urology;  Laterality: N/A;    Allergies: Patient has no known allergies.  Medications: Prior to Admission medications   Medication Sig Start Date End Date Taking? Authorizing Provider  CALCIUM PO Take 1,100 mg by mouth daily at 6 (six) AM.    [provider]  Camphor-Menthol-Methyl Sal (SALONPAS) 3.05-30-08 % PTCH Apply 1 patch  topically daily as needed (Back pain).    [provider]  diphenhydrAMINE (BENADRYL) 25 MG tablet Take 25 mg by mouth at bedtime.     [provider]  finasteride (PROSCAR) 5 MG tablet Take 1 tablet (5 mg total) by mouth daily. 05/06/22   Stoioff, Verna Czech, MD  Glucosamine Sulfate (GNP GLUCOSAME MAXIMUM STRENGTH) 1000 MG TABS Take 1,500 mg by mouth at bedtime.     [provider]  hydrochlorothiazide (HYDRODIURIL) 25 MG tablet TAKE 1 TABLET  DAILY Patient taking differently: Take 25 mg by mouth at bedtime. 1/2 tab q hs 01/02/22   Malva Limes, MD  ibuprofen (ADVIL) 200 MG tablet Take 400 mg by mouth every 6 (six) hours as needed.    [provider]  Multiple Vitamin tablet Take 1 tablet by mouth daily. Silver    [provider]  omeprazole (PRILOSEC) 40 MG capsule TAKE 1 CAPSULE DAILY 05/20/22   Malva Limes, MD  OVER THE COUNTER MEDICATION Apply 1 application topically daily as needed (pain). leg and back pain relief  CBD cream    [provider]  sildenafil (VIAGRA) 100 MG tablet Take 1 tablet (100 mg total) by mouth daily as needed for erectile dysfunction. 05/27/22   Malva Limes, MD  simvastatin (ZOCOR) 20 MG tablet TAKE 1 TABLET AT BEDTIME 01/02/22   Malva Limes, MD  tamsulosin (FLOMAX) 0.4 MG CAPS capsule Take 2 capsules (0.8 mg total) by mouth daily. Patient taking differently: Take 0.4 mg by mouth daily after supper. 05/19/22   Vaillancourt, Lelon Mast, PA-C  Turmeric 500 MG CAPS Take 500 mg by mouth daily.    [provider]     Family History  Problem Relation Age of Onset   Heart attack Mother    Heart attack Father    Prostate cancer Neg Hx    Kidney cancer Neg Hx     Social History   Socioeconomic History   Marital status: Married    Spouse name: Not on file   Number of children: Not on file   Years of education: Not on file   Highest education level: Bachelor's degree (e.g., BA, AB, BS)  Occupational History   Not on file  Tobacco Use   Smoking status: Former    Types: Cigarettes    Quit date: 01/30/2003    Years since quitting: 19.6   Smokeless tobacco: Current    Types: Chew  Vaping Use   Vaping Use: Never used  Substance and Sexual Activity   Alcohol use: Yes    Alcohol/week: 70.0 standard drinks of alcohol    Types: 70 Cans of beer per week    Comment: 8-10 drinks per day   Drug use: No   Sexual activity: Yes    Birth control/protection: None   Other Topics Concern   Not on file  Social History Narrative   Not on file   Social Determinants of Health   Financial Resource Strain: Low Risk  (08/31/2022)   Overall Financial Resource Strain (CARDIA)    Difficulty of Paying Living Expenses: Not hard at all  Food Insecurity: No Food Insecurity (08/31/2022)   Hunger Vital Sign    Worried About Running Out of Food in the Last Year: Never true    Ran Out of Food in the Last Year: Never true  Transportation Needs: No Transportation Needs (08/31/2022)   PRAPARE - Transportation    Lack of Transportation (Medical): No    Lack of  Transportation (Non-Medical): No  Physical Activity: Sufficiently Active (08/31/2022)   Exercise Vital Sign    Days of Exercise per Week: 5 days    Minutes of Exercise per Session: 30 min  Stress: No Stress Concern Present (08/31/2022)   [employer] of Occupational Health - Occupational Stress Questionnaire    Feeling of Stress : Not at all  Social Connections: Moderately Isolated (08/31/2022)   Social Connection and Isolation Panel [NHANES]    Frequency of Communication with Friends and Family: More than three times a week    Frequency of Social Gatherings with Friends and Family: Once a week    Attends Religious Services: Never    Database administrator or Organizations: No    Attends Engineer, structural: Not on file    Marital Status: Married      Review of Systems: A 12 point ROS discussed and pertinent positives are indicated in the HPI above.  All other systems are negative.  Review of Systems  Vital Signs: There were no vitals taken for this visit.   Physical Exam Constitutional:      General: He is not in acute distress.    Appearance: Normal appearance. He is normal weight.  HENT:     Head: Normocephalic and atraumatic.  Eyes:     General: No scleral icterus. Cardiovascular:     Rate and Rhythm: Normal rate.  Pulmonary:     Effort: Pulmonary effort is normal.  Abdominal:      General: Abdomen is flat. There is no distension.     Palpations: Abdomen is soft.     Tenderness: There is no abdominal tenderness.  Skin:    General: Skin is warm and dry.  Neurological:     Mental Status: He is alert and oriented to person, place, and time.  Psychiatric:        Mood and Affect: Mood normal.        Behavior: Behavior normal.       Imaging: IR Radiologist Eval & Mgmt  Result Date: 10/01/2022 EXAM: ESTABLISHED PATIENT OFFICE VISIT CHIEF COMPLAINT: SEE EPIC NOTE HISTORY OF PRESENT ILLNESS: SEE EPIC NOTE REVIEW OF SYSTEMS: SEE EPIC NOTE PHYSICAL EXAMINATION: SEE EPIC NOTE ASSESSMENT AND PLAN: SEE EPIC NOTE Electronically Signed   By: Malachy Moan M.D.   On: 10/01/2022 12:11   IR EMBO TUMOR ORGAN ISCHEMIA INFARCT INC GUIDE ROADMAPPING  Result Date: 09/02/2022 INDICATION: Very pleasant 72 year old gentleman with a history of prostate cancer status post brachytherapy now with benign prostatic hyperplasia and lower urinary tract symptoms. He presents for prostate artery embolization. EXAM: IR EMBO ARTERIAL NOT HEMORR HEMANG INC GUIDE ROADMAPPING MEDICATIONS: Bactrim 80/40. The antibiotic was administered within 1 hour of the procedure ANESTHESIA/SEDATION: Moderate (conscious) sedation was employed during this procedure. A total of Versed 3 mg and Fentanyl 100 mcg was administered intravenously. Moderate Sedation Time: 183 minutes. The patient's level of consciousness and vital signs were monitored continuously by radiology nursing throughout the procedure under my direct supervision. CONTRAST:  220 mL OMNIPAQUE IOHEXOL 300 MG/ML  SOLN FLUOROSCOPY: Radiation Exposure Index (as provided by the fluoroscopic device): 6037 mGy Kerma COMPLICATIONS: None immediate. PROCEDURE: Informed consent was obtained from the patient following explanation of the procedure, risks, benefits and alternatives. The patient understands, agrees and consents for the procedure. All questions were  addressed. A time out was performed prior to the initiation of the procedure. Maximal barrier sterile technique utilized including caps, mask, sterile gowns, sterile gloves, large sterile  drape, hand hygiene, and Betadine prep. The right common femoral artery was interrogated with ultrasound and found to be widely patent. An image was obtained and stored for the medical record. Local anesthesia was attained by infiltration with 1% lidocaine. A small dermatotomy was made. Under real-time sonographic guidance, the vessel was punctured with a 21 gauge micropuncture needle. Using standard technique, the initial micro needle was exchanged over a 0.018 micro wire for a transitional 4 [place] micro sheath. The micro sheath was then exchanged over a 0.035 wire for a 5 French vascular sheath. A C2 cobra catheter was advanced over a Bentson wire into the abdominal aorta. Numerous efforts were made to successfully catheterize the left internal iliac artery. However, the patient's distal aorta and iliac systems are highly tortuous. Ultimately, an up in over approach was abandoned. It became clear that the patient would clinically require bilateral common femoral access with bilateral the ipsilateral prostate artery embolization. Therefore, a rim catheter was introduced on the right in used to select the internal iliac artery. An internal iliac arteriogram was performed. The anterior division and its branches are relatively hypoplastic on the right. The prostatic artery appears to arise from the proximal aspect of the inferior gluteal artery. A 2.5 [place] cantata microcatheter was successfully advanced into the origin of the prostatic artery. Contrast injection was performed. There is clear parenchymal blush within the right hemi prostate within the confines of the brachytherapy seeds. As such, cone beam CT was deemed unnecessary on the side. The catheter was further advanced into the medial branch of the prostatic artery.  Particle embolization was then performed to stasis. The catheter was flushed and advanced into the lateral/capsular branch of prostatic artery. Particle embolization was then performed to stasis. Post embolization arteriography demonstrates no further blush within the prostatic parenchyma. The catheter system was removed. Hemostasis was attained on the right common femoral access with the assistance of a 6 French Celt arterial closure device. The left was interrogated with ultrasound and found to be widely patent. An image was obtained and stored for the medical record. Local anesthesia was attained by infiltration with 1% lidocaine. A small dermatotomy was made. Under real-time sonographic guidance, the vessel was punctured with a 21 gauge micropuncture needle. Using standard technique, the initial micro needle was exchanged over a 0.018 micro wire for a transitional 4 [place] micro sheath. The micro sheath was then exchanged over a 0.035 wire for a 6 French vascular sheath. A C2 cobra catheter was advanced over a wire and used to select the left internal iliac artery. An internal iliac arteriogram was performed. The anterior division is more robust on the left side. Left prostatic artery appears to arise from a common origin with the vesicular artery from the origin of the anterior division. Ultimately, the microcatheter was successfully advanced into the prostatic artery. Contrast injection was performed. Unfortunately, there is collateral filling into the base of the penis. Efforts were made to advance the microcatheter distally for coil embolization. However, this was unsuccessful. Intra arterial nitroglycerin was administered. Additional careful angiography was performed. No further filling of the base of the penis. Contrast plus present within the prostatic parenchyma. Embolization was then performed carefully watching for opening of collaterals and any contrast flow toward the base of the penis. There was  none. Embolization was performed to near stasis. The catheter system was removed. Hemostasis was attained with the assistance of a Celt arterial closure device. IMPRESSION: Successful bilateral prostate artery embolization requiring bilateral common femoral  access due to vessel tortuosity. Electronically Signed   By: Malachy Moan M.D.   On: 09/02/2022 18:13    Labs:  CBC: Recent Labs    11/03/21 1117 08/19/22 0156 09/02/22 1210  WBC 5.5 5.4 4.7  HGB 12.5* 10.3* 11.6*  HCT 37.6* 30.3* 35.6*  PLT 305 240 297    COAGS: Recent Labs    09/02/22 1210  INR 1.0    BMP: Recent Labs    11/03/21 1117 01/20/22 1124 08/19/22 0156 09/02/22 1210  NA 135 134* 131* 139  K 3.7 3.9 3.2* 3.6  CL 99 97* 95* 101  CO2 28 30 26 28   GLUCOSE 91 88 107* 94  BUN 8 10 15 10   CALCIUM 10.5* 10.5* 9.9 10.5*  CREATININE 0.72 0.84 0.68 0.68  GFRNONAA >60 >60 >60 >60    LIVER FUNCTION TESTS: No results for input(s): "BILITOT", "AST", "ALT", "ALKPHOS", "PROT", "ALBUMIN" in the last 8760 hours.  TUMOR MARKERS: No results for input(s): "AFPTM", "CEA", "CA199", "CHROMGRNA" in the last 8760 hours.  Assessment and Plan:  Very pleasant 72 year old gentleman with a history of benign prostatic hyperplasia and severe lower urinary tract symptoms.  He is now 2 weeks status post prostatic artery embolization which was performed on 09/02/2022.  He is doing exceptionally well and has completely recovered.  He reports that his urination has improved dramatically and he is no longer taking Flomax.  1.) Follow-up visit in 3 months.  2.) F/U with Dr. Virl Diamond regarding finasteride    Electronically Signed: Sterling Big 10/01/2022, 1:06 PM   I spent a total of  15 Minutes in face to face in clinical consultation, greater than 50% of which was counseling/coordinating care for BPH with LUTS

## 2022-10-02 ENCOUNTER — Encounter: Payer: Self-pay | Admitting: Urology

## 2022-11-18 ENCOUNTER — Inpatient Hospital Stay: Payer: Medicare HMO | Attending: Radiation Oncology

## 2022-11-18 DIAGNOSIS — C61 Malignant neoplasm of prostate: Secondary | ICD-10-CM | POA: Insufficient documentation

## 2022-11-18 LAB — PSA: Prostatic Specific Antigen: 0.01 ng/mL (ref 0.00–4.00)

## 2022-11-25 ENCOUNTER — Ambulatory Visit
Admission: RE | Admit: 2022-11-25 | Discharge: 2022-11-25 | Disposition: A | Discharge status: Home / Self Care | Payer: Medicare HMO | Source: Ambulatory Visit | Attending: Radiation Oncology | Admitting: Radiation Oncology

## 2022-11-25 ENCOUNTER — Encounter: Payer: Self-pay | Admitting: Urology

## 2022-11-25 ENCOUNTER — Encounter: Payer: Self-pay | Admitting: Radiation Oncology

## 2022-11-25 VITALS — BP 116/76 | HR 68 | Temp 97.3°F | Resp 16 | Wt 166.0 lb

## 2022-11-25 DIAGNOSIS — Z923 Personal history of irradiation: Secondary | ICD-10-CM | POA: Diagnosis not present

## 2022-11-25 DIAGNOSIS — C61 Malignant neoplasm of prostate: Secondary | ICD-10-CM | POA: Insufficient documentation

## 2022-11-25 DIAGNOSIS — Z191 Hormone sensitive malignancy status: Secondary | ICD-10-CM | POA: Diagnosis not present

## 2022-11-25 NOTE — Progress Notes (Signed)
Radiation Oncology Follow up Note  Name: Jason Holmes   Date:   11/25/2022 MRN:  [id number] DOB: 05/03/1951    This 72 y.o. male presents to the clinic today for 62-month follow-up status post I-125 interstitial implant for stage IIc Gleason 6 adenocarcinoma the prostate presenting with a PSA of 6.8.  REFERRING PROVIDER: Malva Limes, MD  HPI: Patient is a 72 year old male now out 10 months having completed I-125 interstitial implant for Gleason 6 adenocarcinoma the prostate presenting with a PSA of 6.8.  Seen today in routine follow-up he is doing well.  Specifically denies any increased lower urinary tract symptoms diarrhea or fatigue..  About 3 months ago he underwent prostate artery embolization which has significantly improved his urinary symptoms he is off all medication for that at this time  COMPLICATIONS OF TREATMENT: none  FOLLOW UP COMPLIANCE: keeps appointments   PHYSICAL EXAM:  BP 116/76 (BP Location: Right Arm, Patient Position: Sitting)   Pulse 68   Temp (!) 97.3 F (36.3 C) (Tympanic)   Resp 16   Wt 166 lb (75.3 kg)   BMI 26.79 kg/m  Well-developed well-nourished patient in NAD. HEENT reveals PERLA, EOMI, discs not visualized.  Oral cavity is clear. No oral mucosal lesions are identified. Neck is clear without evidence of cervical or supraclavicular adenopathy. Lungs are clear to A&P. Cardiac examination is essentially unremarkable with regular rate and rhythm without murmur rub or thrill. Abdomen is benign with no organomegaly or masses noted. Motor sensory and DTR levels are equal and symmetric in the upper and lower extremities. Cranial nerves II through XII are grossly intact. Proprioception is intact. No peripheral adenopathy or edema is identified. No motor or sensory levels are noted. Crude visual fields are within normal range.  RADIOLOGY RESULTS: No current films for review  PLAN: Present time patient is doing well under excellent biochemical control of his  prostate cancer and pleased with his overall progress he has had excellent success with prostate artery embolization.  I have asked to see him back in 6 months for follow-up with a repeat PSA.  Patient is to call with any concerns.  I would like to take this opportunity to thank you for allowing me to participate in the care of your patient.Carmina Miller, MD

## 2022-12-09 DIAGNOSIS — R69 Illness, unspecified: Secondary | ICD-10-CM | POA: Diagnosis not present

## 2022-12-11 DIAGNOSIS — R69 Illness, unspecified: Secondary | ICD-10-CM | POA: Diagnosis not present

## 2022-12-14 DIAGNOSIS — R69 Illness, unspecified: Secondary | ICD-10-CM | POA: Diagnosis not present

## 2022-12-21 DIAGNOSIS — H903 Sensorineural hearing loss, bilateral: Secondary | ICD-10-CM | POA: Diagnosis not present

## 2022-12-23 ENCOUNTER — Encounter: Payer: Self-pay | Admitting: Family Medicine

## 2022-12-24 ENCOUNTER — Other Ambulatory Visit: Payer: Self-pay | Admitting: Family Medicine

## 2022-12-24 DIAGNOSIS — I10 Essential (primary) hypertension: Secondary | ICD-10-CM

## 2022-12-24 DIAGNOSIS — E782 Mixed hyperlipidemia: Secondary | ICD-10-CM

## 2022-12-24 NOTE — Telephone Encounter (Signed)
Requested medication (s) are due for refill today:  Yes   Requested medication (s) are on the active medication list:  Yes   Last refill:  01/02/22  Future visit scheduled: No   Notes to clinic:  hydrochlorothiazide will need a new Rx sent per last OV note 08/2022 (see below). Unable to refill per protocol due to failed labs, no updated results for Simvastatin.  1. Labile blood pressure Currently down to 1/2 hctz each day.     Requested Prescriptions  Pending Prescriptions Disp Refills   hydrochlorothiazide (HYDRODIURIL) 25 MG tablet [Pharmacy Med Name: HYDROCHLOROT TAB 25MG ] 90 tablet 3    Sig: TAKE 1 TABLET DAILY     Cardiovascular: Diuretics - Thiazide Passed - 12/24/2022  2:07 AM      Passed - Cr in normal range and within 180 days    Creat  Date Value Ref Range Status  04/22/2017 0.80 0.70 - 1.25 mg/dL Final    Comment:    For patients >19 years of age, the reference limit for Creatinine is approximately 13% higher for people identified as African-American. .    Creatinine, Ser  Date Value Ref Range Status  09/02/2022 0.68 0.61 - 1.24 mg/dL Final         Passed - K in normal range and within 180 days    Potassium  Date Value Ref Range Status  09/02/2022 3.6 3.5 - 5.1 mmol/L Final         Passed - Na in normal range and within 180 days    Sodium  Date Value Ref Range Status  09/02/2022 139 135 - 145 mmol/L Final  09/12/2020 140 134 - 144 mmol/L Final         Passed - Last BP in normal range    BP Readings from Last 1 Encounters:  11/25/22 116/76         Passed - Valid encounter within last 6 months    Recent Outpatient Visits           3 months ago Nutritional anemia   [facility] Malva Limes, MD   4 months ago Essential (primary) hypertension   [place] [facility] [place], [place], PA-C   1 year ago Essential (primary) hypertension   [place] [facility] Malva Limes, MD   1 year  ago Subconjunctival hemorrhage of right eye   [facility] [place], [place], PA-C   2 years ago Essential (primary) hypertension   [place] [facility] Malva Limes, MD       Future Appointments             In 1 month Stoioff, Verna Czech, MD [facility] [place]             simvastatin (ZOCOR) 20 MG tablet [Pharmacy Med Name: SIMVASTATIN  TAB 20MG ] 90 tablet 3    Sig: TAKE 1 TABLET AT BEDTIME     Cardiovascular:  Antilipid - Statins Failed - 12/24/2022  2:07 AM      Failed - Lipid Panel in normal range within the last 12 months    Cholesterol, Total  Date Value Ref Range Status  11/19/2021 222 (H) 100 - 199 mg/dL Final   LDL Cholesterol (Calc)  Date Value Ref Range Status  04/22/2017 103 (H) mg/dL (calc) Final    Comment:    Reference range: <100 . Desirable range <100 mg/dL for primary prevention;   <70 mg/dL  for patients with CHD or diabetic patients  with > or = 2 CHD risk factors. Marland Kitchen LDL-C is now calculated using the Martin-Hopkins  calculation, which is a validated novel method providing  better accuracy than the Friedewald equation in the  estimation of LDL-C.  Horald Pollen et al. Lenox Ahr. 8469;629(52): 2061-2068  (http://education.[employer].com/faq/FAQ164)    LDL Chol Calc (NIH)  Date Value Ref Range Status  11/19/2021 112 (H) 0 - 99 mg/dL Final   HDL  Date Value Ref Range Status  11/19/2021 98 >39 mg/dL Final   Triglycerides  Date Value Ref Range Status  11/19/2021 71 0 - 149 mg/dL Final         Passed - Patient is not pregnant      Passed - Valid encounter within last 12 months    Recent Outpatient Visits           3 months ago Nutritional anemia   [facility] [facility] Malva Limes, MD   4 months ago Essential (primary) hypertension   [place] [facility] [place], [place], PA-C   1 year ago Essential (primary) hypertension   [place] [facility] Malva Limes, MD   1 year ago Subconjunctival hemorrhage of right eye   [place] [facility] [place], [place], PA-C   2 years ago Essential (primary) hypertension   [place] [facility] Malva Limes, MD       Future Appointments             In 1 month Stoioff, Verna Czech, MD [facility] [place]

## 2022-12-30 DIAGNOSIS — H903 Sensorineural hearing loss, bilateral: Secondary | ICD-10-CM | POA: Diagnosis not present

## 2023-01-07 ENCOUNTER — Other Ambulatory Visit: Payer: Self-pay | Admitting: Interventional Radiology

## 2023-01-07 DIAGNOSIS — R338 Other retention of urine: Secondary | ICD-10-CM

## 2023-01-13 ENCOUNTER — Ambulatory Visit (INDEPENDENT_AMBULATORY_CARE_PROVIDER_SITE_OTHER): Payer: Medicare HMO | Admitting: Family Medicine

## 2023-01-13 ENCOUNTER — Encounter: Payer: Self-pay | Admitting: Family Medicine

## 2023-01-13 VITALS — BP 129/73 | HR 64 | Temp 98.2°F | Resp 12 | Ht 66.0 in | Wt 170.7 lb

## 2023-01-13 DIAGNOSIS — N451 Epididymitis: Secondary | ICD-10-CM

## 2023-01-13 LAB — POCT URINALYSIS DIPSTICK
Bilirubin, UA: NEGATIVE
Blood, UA: NEGATIVE
Glucose, UA: NEGATIVE
Ketones, UA: NEGATIVE
Leukocytes, UA: NEGATIVE
Nitrite, UA: NEGATIVE
Protein, UA: NEGATIVE
Spec Grav, UA: 1.01 (ref 1.010–1.025)
Urobilinogen, UA: 0.2 E.U./dL
pH, UA: 6 (ref 5.0–8.0)

## 2023-01-13 MED ORDER — DOXYCYCLINE HYCLATE 100 MG PO TABS
100.0000 mg | ORAL_TABLET | Freq: Two times a day (BID) | ORAL | 0 refills | Status: AC
Start: 2023-01-13 — End: 2023-01-23

## 2023-01-13 NOTE — Assessment & Plan Note (Signed)
Pain started a few days ago, improved slightly. No urinary symptoms, fever, or chills. No visible swelling or redness. Tenderness on palpation of the left testicle. Possible recent sexual activity. No history of trauma. Differential includes epididymitis or strain. -Start Doxycycline 100mg  BID for 10 days for possible epididymitis. -Collect urine sample for culture. -If no improvement in 3-4 days, consider ultrasound. -Follow-up with urology in a couple of weeks.

## 2023-01-13 NOTE — Patient Instructions (Signed)
VISIT SUMMARY:  During your visit, we discussed your recent left testicular pain. You mentioned that the pain started a few days ago and has slightly improved. There were no signs of swelling, redness, or other symptoms such as fever or chills. You also mentioned recent sexual activity. After examining you, I believe the pain could be due to an inflammation of the tube at the back of the testicle that stores and carries sperm (epididymitis) or a strain.  YOUR PLAN:  -LEFT TESTICULAR PAIN: I have prescribed Doxycycline, an antibiotic, to treat possible epididymitis. If the pain does not improve in the next 3-4 days.  You will also need to provide a urine sample for testing. Please follow up with a urologist in a couple of weeks.  INSTRUCTIONS:  Please start taking Doxycycline 100mg  twice a day for 10 days. If there is no improvement in your pain in the next 3-4 days, please contact us. We may need to schedule an ultrasound. Make sure to schedule a follow-up appointment with a urologist in a couple of weeks.

## 2023-01-13 NOTE — Progress Notes (Signed)
Established patient visit   Patient: Jason Holmes   DOB: 06-May-1951   72 y.o. Male  MRN: [id number] Visit Date: 01/13/2023  Today's healthcare provider: Ronnald Ramp, MD   Chief Complaint  Patient presents with   Testicle Pain    Patient c/o pain around left testicle since Saturday. He reports pain is better today. Patient denies any painful urination or burning, no fever or chills.    Subjective     HPI     Testicle Pain    Additional comments: Patient c/o pain around left testicle since Saturday. He reports pain is better today. Patient denies any painful urination or burning, no fever or chills.       Last edited by Myles Lipps, CMA on 01/13/2023  3:02 PM.       Discussed the use of AI scribe software for clinical note transcription with the patient, who gave verbal consent to proceed.  History of Present Illness   The patient, with a history of prostate cancer and benign prostatic hyperplasia (BPH) treated with radioactive implants and aortic embolism respectively, presented with left testicular pain. The pain started a few days prior to the consultation, with no identifiable inciting event. The discomfort was described as tender, necessitating careful movements and sitting positions. The pain seemed to have improved slightly over the course of a few days. The patient denied any urinary symptoms, fever, or chills. He is sexually active with his wife with the last encounter being 5 days ago. There was no visible swelling or redness in the area. The patient reported less frequent sexual activity following prostate surgeries within the last year. The patient was wearing supportive underwear to alleviate discomfort.      Medications: Outpatient Medications Prior to Visit  Medication Sig   Camphor-Menthol-Methyl Sal (SALONPAS) 3.05-30-08 % PTCH Apply 1 patch topically daily as needed (Back pain).   diphenhydrAMINE (BENADRYL) 25 MG tablet Take 25 mg  by mouth at bedtime.    Glucosamine Sulfate (GNP GLUCOSAME MAXIMUM STRENGTH) 1000 MG TABS Take 1,500 mg by mouth at bedtime.    hydrochlorothiazide (HYDRODIURIL) 25 MG tablet Take 0.5 tablets (12.5 mg total) by mouth daily.   ibuprofen (ADVIL) 200 MG tablet Take 400 mg by mouth every 6 (six) hours as needed.   Multiple Vitamin tablet Take 1 tablet by mouth daily. Silver   omeprazole (PRILOSEC) 40 MG capsule TAKE 1 CAPSULE DAILY   OVER THE COUNTER MEDICATION Apply 1 application topically daily as needed (pain). leg and back pain relief  CBD cream   sildenafil (VIAGRA) 100 MG tablet Take 1 tablet (100 mg total) by mouth daily as needed for erectile dysfunction.   simvastatin (ZOCOR) 20 MG tablet TAKE 1 TABLET AT BEDTIME   Turmeric 500 MG CAPS Take 500 mg by mouth daily.   No facility-administered medications prior to visit.    Review of Systems      Objective    BP 129/73 (BP Location: Right Arm, Patient Position: Sitting, Cuff Size: Large)   Pulse 64   Temp 98.2 F (36.8 C) (Temporal)   Resp 12   Ht 5\' 6"  (1.676 m)   Wt 170 lb 11.2 oz (77.4 kg)   SpO2 97%   BMI 27.55 kg/m     Physical Exam Vitals reviewed.  Constitutional:      General: He is not in acute distress.    Appearance: Normal appearance. He is not ill-appearing, toxic-appearing or diaphoretic.  Eyes:  Conjunctiva/sclera: Conjunctivae normal.  Cardiovascular:     Rate and Rhythm: Normal rate and regular rhythm.     Pulses: Normal pulses.     Heart sounds: Normal heart sounds. No murmur heard.    No friction rub. No gallop.  Pulmonary:     Effort: Pulmonary effort is normal. No respiratory distress.     Breath sounds: Normal breath sounds. No stridor. No wheezing, rhonchi or rales.  Abdominal:     General: Bowel sounds are normal. There is no distension.     Palpations: Abdomen is soft.     Tenderness: There is no abdominal tenderness.  Genitourinary:    Pubic Area: No rash.      Penis: Circumcised.  No phimosis, paraphimosis, erythema, tenderness, discharge, swelling or lesions.      Testes:        Right: Mass, tenderness, swelling, testicular hydrocele or varicocele not present. Right testis is descended.        Left: Tenderness present. Mass, swelling, testicular hydrocele or varicocele not present. Left testis is descended.     Epididymis:     Right: No tenderness.     Left: Not inflamed or enlarged. Tenderness present. No mass.  Musculoskeletal:     Right lower leg: No edema.     Left lower leg: No edema.  Skin:    Findings: No erythema or rash.  Neurological:     Mental Status: He is alert and oriented to person, place, and time.       Results for orders placed or performed in visit on 01/13/23  POCT Urinalysis Dipstick  Result Value Ref Range   Color, UA yellow    Clarity, UA clear    Glucose, UA Negative Negative   Bilirubin, UA Negative    Ketones, UA Negative    Spec Grav, UA 1.010 1.010 - 1.025   Blood, UA Negative    pH, UA 6.0 5.0 - 8.0   Protein, UA Negative Negative   Urobilinogen, UA 0.2 0.2 or 1.0 E.U./dL   Nitrite, UA Negative    Leukocytes, UA Negative Negative   Appearance     Odor      Assessment & Plan     Problem List Items Addressed This Visit     Epididymitis - Primary    Pain started a few days ago, improved slightly. No urinary symptoms, fever, or chills. No visible swelling or redness. Tenderness on palpation of the left testicle. Possible recent sexual activity. No history of trauma. Differential includes epididymitis or strain. -Start Doxycycline 100mg  BID for 10 days for possible epididymitis. -Collect urine sample for culture. -If no improvement in 3-4 days, consider ultrasound. -Follow-up with urology in a couple of weeks.       Relevant Medications   doxycycline (VIBRA-TABS) 100 MG tablet   Other Relevant Orders   Urine Culture   POCT Urinalysis Dipstick (Completed)         Return if symptoms worsen or fail to improve,  for testicle pain.       Ronnald Ramp, MD  [facility] [phone] (phone) [phone] (fax)  [facility]

## 2023-01-15 LAB — URINE CULTURE

## 2023-01-19 ENCOUNTER — Ambulatory Visit
Admission: RE | Admit: 2023-01-19 | Discharge: 2023-01-19 | Disposition: A | Discharge status: Home / Self Care | Payer: Medicare HMO | Source: Ambulatory Visit | Attending: Interventional Radiology | Admitting: Interventional Radiology

## 2023-01-19 DIAGNOSIS — N401 Enlarged prostate with lower urinary tract symptoms: Secondary | ICD-10-CM

## 2023-01-19 DIAGNOSIS — Z9889 Other specified postprocedural states: Secondary | ICD-10-CM | POA: Diagnosis not present

## 2023-01-19 HISTORY — PX: IR RADIOLOGIST EVAL & MGMT: IMG5224

## 2023-01-19 NOTE — Progress Notes (Signed)
Chief Complaint: Patient was consulted remotely today (TeleHealth) for BPH with severe LUTS now s/p prostate artery embolization at the request of Maricela Kawahara K.    Referring Physician(s): Carman Ching   History of Present Illness: Jason Holmes is a 72 y.o. male with a history of prostate cancer status post brachytherapy treatment.  His most recent PSAs indicate a complete response to therapy with no concern for residual disease.  Unfortunately, over the past year he had developed significant lower urinary tract symptoms in the setting of benign prostatic hyperplasia.  He was on both tamsulosin and finasteride.  Unfortunately, the finasteride has had a detrimental effect on his libido which he finds quite distressing.   He filled out the international prostate symptom severity score in our office.  He scored 27 out of 35 on the symptom severity score consistent with severe symptoms.  Additionally, his quality of life is significantly impacted as he scored a 4 out of 6 and reports that he would be mostly dissatisfied if he did not get symptom relief.   He underwent prostatic artery embolization on 09/02/2022.  We spoke over the phone today for his 4 month follow-up evaluation.  He reports that his post treatment course has remained uneventful.  He had no significant prostatic or penile pain, and no dysuria.  He is currently doing exceptionally well.  He reports that he continues to "pee like a horse" and remains off his Flomax.   He has no complaints at this time and is extremely happy with his outcome thus far.  Past Medical History:  Diagnosis Date   Anemia    Basal cell carcinoma (BCC) of left nasal sidewall 2023   MOHS Surgery [facility] 03/31/2022   Elevated PSA    Elevated TSH    Erectile dysfunction    Excessive drinking of alcohol    GERD (gastroesophageal reflux disease)    Peptic ulcer    Prostate cancer [facility])    Sciatica    right    Past Surgical History:   Procedure Laterality Date   COLONOSCOPY N/A 02/24/2017   Procedure: COLONOSCOPY;  Surgeon: Toney Reil, MD;  Location[facility];  Service: Endoscopy;  Laterality: N/A;   IR EMBO TUMOR ORGAN ISCHEMIA INFARCT INC GUIDE ROADMAPPING  09/02/2022   IR RADIOLOGIST EVAL & MGMT  07/30/2022   IR RADIOLOGIST EVAL & MGMT  10/01/2022   MOHS SURGERY Left 03/31/2022   [facility]   NASAL FRACTURE SURGERY  2005   PROSTATE BIOPSY     PROSTATE BIOPSY N/A 03/05/2020   Procedure: BIOPSY TRANSRECTAL ULTRASONIC PROSTATE (TUBP);  Surgeon: Riki Altes, MD;  Location: [facility];  Service: Urology;  Laterality: N/A;   PROSTATE BIOPSY N/A 11/04/2021   Procedure: PROSTATE BIOPSY;  Surgeon: Riki Altes, MD;  Location: [facility];  Service: Urology;  Laterality: N/A;   RADIOACTIVE SEED IMPLANT N/A 01/27/2022   Procedure: RADIOACTIVE SEED IMPLANT/BRACHYTHERAPY IMPLANT/CYSTOSCOPY/ BLADDER BIOPSY/FULGERATION;  Surgeon: Sondra Come, MD;  Location: [facility];  Service: Urology;  Laterality: N/A;   TONSILLECTOMY  1972   TRANSRECTAL ULTRASOUND N/A 11/04/2021   Procedure: TRANSRECTAL ULTRASOUND;  Surgeon: Riki Altes, MD;  Location: [facility];  Service: Urology;  Laterality: N/A;    Allergies: Patient has no known allergies.  Medications: Prior to Admission medications   Medication Sig Start Date End Date Taking? Authorizing Provider  Camphor-Menthol-Methyl Sal (SALONPAS) 3.05-30-08 % PTCH Apply 1 patch topically daily as needed (Back pain).    [provider]  diphenhydrAMINE (BENADRYL) 25 MG tablet Take 25 mg by mouth at bedtime.     [provider]  doxycycline (VIBRA-TABS) 100 MG tablet Take 1 tablet (100 mg total) by mouth 2 (two) times daily for 10 days. 01/13/23 01/23/23  Simmons-Robinson, Makiera, MD  Glucosamine Sulfate (GNP GLUCOSAME MAXIMUM STRENGTH) 1000 MG TABS Take 1,500 mg by mouth at bedtime.     [provider]  hydrochlorothiazide (HYDRODIURIL) 25 MG tablet  Take 0.5 tablets (12.5 mg total) by mouth daily. 12/25/22   Malva Limes, MD  ibuprofen (ADVIL) 200 MG tablet Take 400 mg by mouth every 6 (six) hours as needed.    [provider]  Multiple Vitamin tablet Take 1 tablet by mouth daily. Silver    [provider]  omeprazole (PRILOSEC) 40 MG capsule TAKE 1 CAPSULE DAILY 05/20/22   Malva Limes, MD  OVER THE COUNTER MEDICATION Apply 1 application topically daily as needed (pain). leg and back pain relief  CBD cream    [provider]  sildenafil (VIAGRA) 100 MG tablet Take 1 tablet (100 mg total) by mouth daily as needed for erectile dysfunction. 05/27/22   Malva Limes, MD  simvastatin (ZOCOR) 20 MG tablet TAKE 1 TABLET AT BEDTIME 12/25/22   Malva Limes, MD  Turmeric 500 MG CAPS Take 500 mg by mouth daily.    [provider]     Family History  Problem Relation Age of Onset   Heart attack Mother    Heart attack Father    Prostate cancer Neg Hx    Kidney cancer Neg Hx     Social History   Socioeconomic History   Marital status: Married    Spouse name: Not on file   Number of children: Not on file   Years of education: Not on file   Highest education level: Bachelor's degree (e.g., BA, AB, BS)  Occupational History   Not on file  Tobacco Use   Smoking status: Former    Current packs/day: 0.00    Types: Cigarettes    Quit date: 01/30/2003    Years since quitting: 19.9   Smokeless tobacco: Current    Types: Chew  Vaping Use   Vaping status: Never Used  Substance and Sexual Activity   Alcohol use: Yes    Alcohol/week: 70.0 standard drinks of alcohol    Types: 70 Cans of beer per week    Comment: 8-10 drinks per day   Drug use: No   Sexual activity: Yes    Birth control/protection: None  Other Topics Concern   Not on file  Social History Narrative   Not on file   Social Determinants of Health   Financial Resource Strain: Low Risk  (08/31/2022)   Overall Financial Resource  Strain (CARDIA)    Difficulty of Paying Living Expenses: Not hard at all  Food Insecurity: No Food Insecurity (08/31/2022)   Hunger Vital Sign    Worried About Running Out of Food in the Last Year: Never true    Ran Out of Food in the Last Year: Never true  Transportation Needs: No Transportation Needs (08/31/2022)   PRAPARE - Administrator, Civil Service (Medical): No    Lack of Transportation (Non-Medical): No  Physical Activity: Sufficiently Active (08/31/2022)   Exercise Vital Sign    Days of Exercise per Week: 5 days    Minutes of Exercise per Session: 30 min  Stress: No Stress Concern Present (08/31/2022)  [employer] of Occupational Health - Occupational Stress Questionnaire    Feeling of Stress : Not at all  Social Connections: Moderately Isolated (08/31/2022)   Social Connection and Isolation Panel [NHANES]    Frequency of Communication with Friends and Family: More than three times a week    Frequency of Social Gatherings with Friends and Family: Once a week    Attends Religious Services: Never    Database administrator or Organizations: No    Attends Engineer, structural: Not on file    Marital Status: Married    Review of Systems  Review of Systems: A 12 point ROS discussed and pertinent positives are indicated in the HPI above.  All other systems are negative.  Advance Care Plan: The advanced care plan/surrogate decision maker was discussed at the time of visit and the patient did not wish to discuss or was not able to name a surrogate decision maker or provide an advance care plan.    Physical Exam No direct physical exam was performed (except for noted visual exam findings with Video Visits).    Vital Signs: There were no vitals taken for this visit.  Imaging: No results found.  Labs:  CBC: Recent Labs    08/19/22 0156 09/02/22 1210  WBC 5.4 4.7  HGB 10.3* 11.6*  HCT 30.3* 35.6*  PLT 240 297    COAGS: Recent Labs     09/02/22 1210  INR 1.0    BMP: Recent Labs    01/20/22 1124 08/19/22 0156 09/02/22 1210  NA 134* 131* 139  K 3.9 3.2* 3.6  CL 97* 95* 101  CO2 30 26 28   GLUCOSE 88 107* 94  BUN 10 15 10   CALCIUM 10.5* 9.9 10.5*  CREATININE 0.84 0.68 0.68  GFRNONAA >60 >60 >60    LIVER FUNCTION TESTS: No results for input(s): "BILITOT", "AST", "ALT", "ALKPHOS", "PROT", "ALBUMIN" in the last 8760 hours.  TUMOR MARKERS: No results for input(s): "AFPTM", "CEA", "CA199", "CHROMGRNA" in the last 8760 hours.  Assessment and Plan:  Very pleasant 71 year old gentleman with a history of benign prostatic hyperplasia and severe lower urinary tract symptoms.  He is now 4 months status post prostatic artery embolization which was performed on 09/02/2022.  He is doing exceptionally well and has completely recovered.  He reports that his urination has improved dramatically and he is no longer taking Flomax.   1.) Follow-up visit at 1 year post procedure in April 2025.   Electronically Signed: Sterling Big 01/19/2023, 1:18 PM   I spent a total of 15 Minutes in remote  clinical consultation, greater than 50% of which was counseling/coordinating care for BPH with LUTS.    Visit type: Audio only (telephone). Audio (no video) only due to patient preference. Alternative for in-person consultation at [facility], [address] [place], [place], [place]. This visit type was conducted due to national recommendations for restrictions regarding the COVID-19 Pandemic (e.g. social distancing).  This format is felt to be most appropriate for this patient at this time.  All issues noted in this document were discussed and addressed.

## 2023-01-20 ENCOUNTER — Encounter: Payer: Self-pay | Admitting: Family Medicine

## 2023-01-20 ENCOUNTER — Other Ambulatory Visit: Payer: Self-pay | Admitting: Family Medicine

## 2023-01-20 DIAGNOSIS — N50812 Left testicular pain: Secondary | ICD-10-CM

## 2023-01-20 DIAGNOSIS — N451 Epididymitis: Secondary | ICD-10-CM

## 2023-01-21 ENCOUNTER — Ambulatory Visit
Admission: RE | Admit: 2023-01-21 | Discharge: 2023-01-21 | Disposition: A | Discharge status: Home / Self Care | Payer: Medicare HMO | Source: Ambulatory Visit | Attending: Family Medicine | Admitting: Family Medicine

## 2023-01-21 ENCOUNTER — Other Ambulatory Visit: Payer: Self-pay | Admitting: Oncology

## 2023-01-21 DIAGNOSIS — Z006 Encounter for examination for normal comparison and control in clinical research program: Secondary | ICD-10-CM

## 2023-01-21 DIAGNOSIS — N50812 Left testicular pain: Secondary | ICD-10-CM | POA: Insufficient documentation

## 2023-02-08 ENCOUNTER — Ambulatory Visit: Payer: Medicare HMO | Admitting: Urology

## 2023-02-17 ENCOUNTER — Encounter: Payer: Self-pay | Admitting: Urology

## 2023-02-17 ENCOUNTER — Ambulatory Visit: Payer: Medicare HMO | Admitting: Urology

## 2023-02-17 VITALS — BP 133/74 | HR 69 | Ht 66.0 in | Wt 170.0 lb

## 2023-02-17 DIAGNOSIS — Z8551 Personal history of malignant neoplasm of bladder: Secondary | ICD-10-CM | POA: Diagnosis not present

## 2023-02-17 DIAGNOSIS — R339 Retention of urine, unspecified: Secondary | ICD-10-CM | POA: Diagnosis not present

## 2023-02-17 DIAGNOSIS — N138 Other obstructive and reflux uropathy: Secondary | ICD-10-CM

## 2023-02-17 LAB — URINALYSIS, COMPLETE
Bilirubin, UA: NEGATIVE
Glucose, UA: NEGATIVE
Ketones, UA: NEGATIVE
Leukocytes,UA: NEGATIVE
Nitrite, UA: NEGATIVE
Protein,UA: NEGATIVE
Specific Gravity, UA: <1.005 — ABNORMAL LOW (ref 1.005–1.030)
Urobilinogen, Ur: 0.2 mg/dL (ref 0.2–1.0)
pH, UA: 5.5 (ref 5.0–7.5)

## 2023-02-17 LAB — MICROSCOPIC EXAMINATION: Bacteria, UA: NONE SEEN

## 2023-02-17 MED ORDER — TADALAFIL 20 MG PO TABS: 20.0000 mg | ORAL_TABLET | Freq: Every day | ORAL | 0 refills | Status: DC | PRN

## 2023-02-17 NOTE — Progress Notes (Signed)
   02/17/23  CC:  Chief Complaint  Patient presents with   Prostate Cancer    Follow up    Indications: 1 cm papillary bladder tumor identified on post brachytherapy cystoscopy 01/2022 Path Ta urothelial carcinoma low-grade  HPI: 72 y.o. male presents for 72-month surveillance cystoscopy.  He has no complaints.  Blood pressure (!) 143/82, pulse 76, height 5\' 10"  (1.778 m), weight 163 lb (73.9 kg). NED. A&Ox3.   No respiratory distress   Abd soft, NT, ND Normal phallus with bilateral descended testicles  Cystoscopy Procedure Note  Patient identification was confirmed, informed consent was obtained, and patient was prepped using Betadine solution.  Lidocaine jelly was administered per urethral meatus.     Pre-Procedure: - Inspection reveals a normal caliber urethral meatus.  Procedure: The flexible cystoscope was introduced without difficulty - No urethral strictures/lesions are present. -  Prominent lateral lobe enlargement with hypervascularity  prostate  - Moderate elevation bladder neck - Bilateral ureteral orifices identified - Bladder mucosa  reveals no ulcers, tumors, or lesions - No bladder stones - No trabeculation  Retroflexion shows no abnormalities   Post-Procedure: - Patient tolerated the procedure well  Assessment/ Plan: No evidence of recurrent bladder tumor Surveillance cystoscopy 1 year    Riki Altes, MD

## 2023-02-23 ENCOUNTER — Encounter: Payer: Medicare HMO | Admitting: Family Medicine

## 2023-03-15 ENCOUNTER — Other Ambulatory Visit: Payer: Self-pay | Admitting: Family Medicine

## 2023-03-17 ENCOUNTER — Other Ambulatory Visit: Payer: Medicare HMO

## 2023-04-08 ENCOUNTER — Other Ambulatory Visit: Payer: Self-pay | Admitting: *Deleted

## 2023-04-08 ENCOUNTER — Encounter: Payer: Self-pay | Admitting: Urology

## 2023-04-08 MED ORDER — TADALAFIL 20 MG PO TABS: 20.0000 mg | ORAL_TABLET | Freq: Every day | ORAL | 0 refills | Status: DC | PRN

## 2023-04-13 DIAGNOSIS — H0102A Squamous blepharitis right eye, upper and lower eyelids: Secondary | ICD-10-CM | POA: Diagnosis not present

## 2023-04-13 DIAGNOSIS — H0288A Meibomian gland dysfunction right eye, upper and lower eyelids: Secondary | ICD-10-CM | POA: Diagnosis not present

## 2023-04-13 DIAGNOSIS — H40013 Open angle with borderline findings, low risk, bilateral: Secondary | ICD-10-CM | POA: Diagnosis not present

## 2023-04-13 DIAGNOSIS — H524 Presbyopia: Secondary | ICD-10-CM | POA: Diagnosis not present

## 2023-04-13 DIAGNOSIS — H0102B Squamous blepharitis left eye, upper and lower eyelids: Secondary | ICD-10-CM | POA: Diagnosis not present

## 2023-04-13 DIAGNOSIS — H35033 Hypertensive retinopathy, bilateral: Secondary | ICD-10-CM | POA: Diagnosis not present

## 2023-04-13 DIAGNOSIS — H25813 Combined forms of age-related cataract, bilateral: Secondary | ICD-10-CM | POA: Diagnosis not present

## 2023-04-13 DIAGNOSIS — I10 Essential (primary) hypertension: Secondary | ICD-10-CM | POA: Diagnosis not present

## 2023-04-13 DIAGNOSIS — H0288B Meibomian gland dysfunction left eye, upper and lower eyelids: Secondary | ICD-10-CM | POA: Diagnosis not present

## 2023-04-13 DIAGNOSIS — L718 Other rosacea: Secondary | ICD-10-CM | POA: Diagnosis not present

## 2023-04-22 IMAGING — MR MR PROSTATE WO/W CM
56 series · 56 of 56 positions shown · IV contrast (7ml Gadavist)
Comparison: None

CLINICAL DATA: A 70-year-old male presents with history of low risk
prostate cancer on active surveillance with increasing PSA. Most
recent PSA of 14.3, general trend of PSA is increased since 8809.

EXAM:
MR PROSTATE WITHOUT AND WITH CONTRAST
TECHNIQUE: Multiplanar multisequence MRI images were obtained of the pelvis
centered about the prostate. Pre and post contrast images were
obtained.
CONTRAST:  7mL GADAVIST GADOBUTROL 1 MMOL/ML IV SOLN

[Series 3: ax in&out whole · axial · 3.0mm · 1.19mm/px · 1 of 88 slices shown (1 of 2)]
[im 1/88]
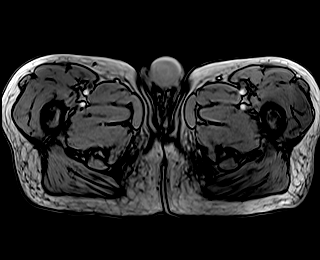

[Series 4: ax in&out whole · axial · 3.0mm · 1.19mm/px · 1 of 88 slices shown (2 of 2)]
[im 1/88]
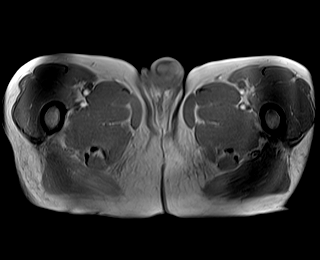

[Series 5: T2 · coronal · 3.0mm · 0.70mm/px · 1 of 35 slices shown (1 of 3)]
[im 1/35]
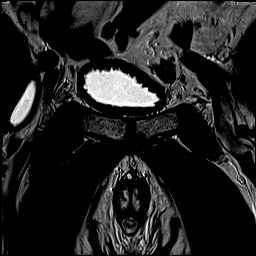

[Series 6: T2 · axial · 3.0mm · 0.56mm/px · 1 of 27 slices shown (2 of 3)]
[im 1/27]
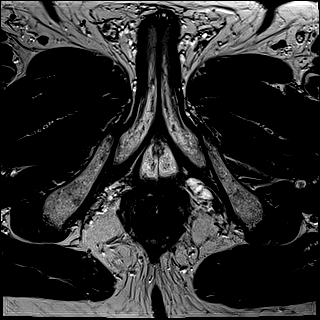

[Series 7: DWI · axial · 3.0mm · 0.86mm/px · 1 of 81 slices shown (1 of 3)]
[im 1/81]
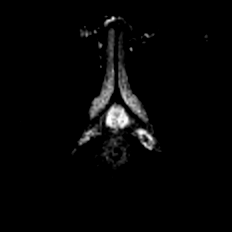

[Series 8: DWI · axial · 3.0mm · 0.86mm/px · 1 of 27 slices shown (2 of 3)]
[im 1/27]
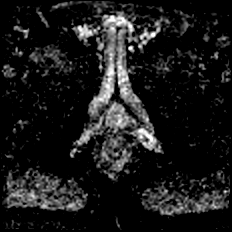

[Series 9: DWI · axial · 3.0mm · 0.86mm/px · 1 of 27 slices shown (3 of 3)]
[im 1/27]
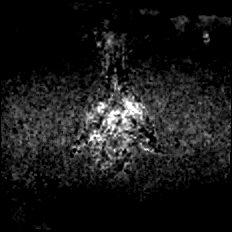

[Series 10: T2 · axial · 1.0mm · 1.04mm/px · 1 of 72 slices shown (3 of 3)]
[im 1/72]
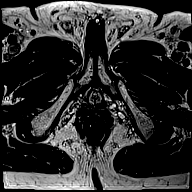

[Series 11: T1 · axial · 3.0mm · 1.15mm/px · 1 of 28 slices shown (1 of 48)]
[im 1/28]
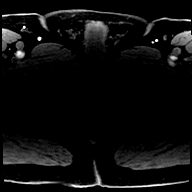

[Series 12: T1 · axial · 3.0mm · 1.15mm/px · 1 of 28 slices shown (2 of 48)]
[im 1/28]
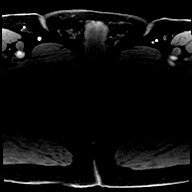

[Series 13: T1 · axial · 3.0mm · 1.15mm/px · 1 of 28 slices shown (3 of 48)]
[im 1/28]
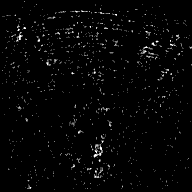

[Series 14: T1 · axial · 3.0mm · 1.15mm/px · 1 of 28 slices shown (4 of 48)]
[im 1/28]
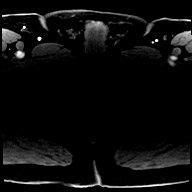

[Series 15: T1 · axial · 3.0mm · 1.15mm/px · 1 of 28 slices shown (5 of 48)]
[im 1/28]
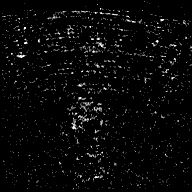

[Series 16: T1 · axial · 3.0mm · 1.15mm/px · 1 of 28 slices shown (6 of 48)]
[im 1/28]
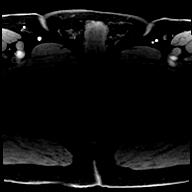

[Series 17: T1 · axial · 3.0mm · 1.15mm/px · 1 of 28 slices shown (7 of 48)]
[im 1/28]
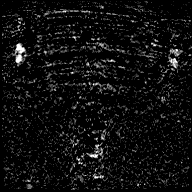

[Series 18: T1 · axial · 3.0mm · 1.15mm/px · 1 of 28 slices shown (8 of 48)]
[im 1/28]
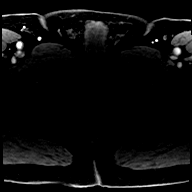

[Series 19: T1 · axial · 3.0mm · 1.15mm/px · 1 of 28 slices shown (9 of 48)]
[im 1/28]
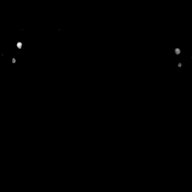

[Series 20: T1 · axial · 3.0mm · 1.15mm/px · 1 of 28 slices shown (10 of 48)]
[im 1/28]
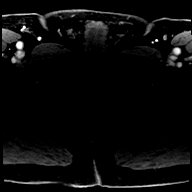

[Series 21: T1 · axial · 3.0mm · 1.15mm/px · 1 of 28 slices shown (11 of 48)]
[im 1/28]
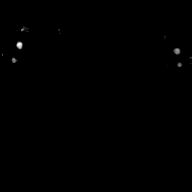

[Series 22: T1 · axial · 3.0mm · 1.15mm/px · 1 of 28 slices shown (12 of 48)]
[im 1/28]
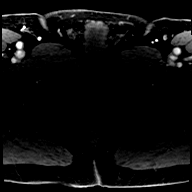

[Series 23: T1 · axial · 3.0mm · 1.15mm/px · 1 of 28 slices shown (13 of 48)]
[im 1/28]
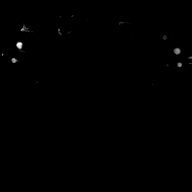

[Series 24: T1 · axial · 3.0mm · 1.15mm/px · 1 of 28 slices shown (14 of 48)]
[im 1/28]
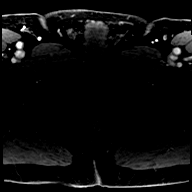

[Series 25: T1 · axial · 3.0mm · 1.15mm/px · 1 of 28 slices shown (15 of 48)]
[im 1/28]
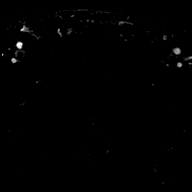

[Series 26: T1 · axial · 3.0mm · 1.15mm/px · 1 of 28 slices shown (16 of 48)]
[im 1/28]
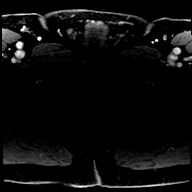

[Series 27: T1 · axial · 3.0mm · 1.15mm/px · 1 of 28 slices shown (17 of 48)]
[im 1/28]
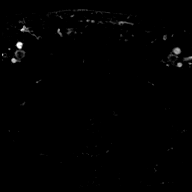

[Series 28: T1 · axial · 3.0mm · 1.15mm/px · 1 of 28 slices shown (18 of 48)]
[im 1/28]
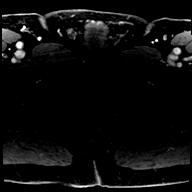

[Series 29: T1 · axial · 3.0mm · 1.15mm/px · 1 of 28 slices shown (19 of 48)]
[im 1/28]
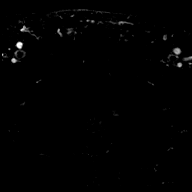

[Series 30: T1 · axial · 3.0mm · 1.15mm/px · 1 of 28 slices shown (20 of 48)]
[im 1/28]
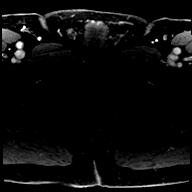

[Series 31: T1 · axial · 3.0mm · 1.15mm/px · 1 of 28 slices shown (21 of 48)]
[im 1/28]
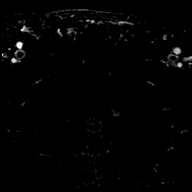

[Series 32: T1 · axial · 3.0mm · 1.15mm/px · 1 of 28 slices shown (22 of 48)]
[im 1/28]
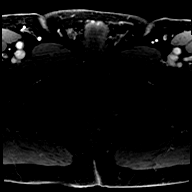

[Series 33: T1 · axial · 3.0mm · 1.15mm/px · 1 of 28 slices shown (23 of 48)]
[im 1/28]
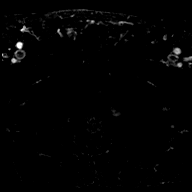

[Series 34: T1 · axial · 3.0mm · 1.15mm/px · 1 of 28 slices shown (24 of 48)]
[im 1/28]
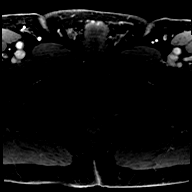

[Series 35: T1 · axial · 3.0mm · 1.15mm/px · 1 of 28 slices shown (25 of 48)]
[im 1/28]
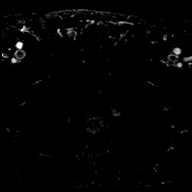

[Series 36: T1 · axial · 3.0mm · 1.15mm/px · 1 of 28 slices shown (26 of 48)]
[im 1/28]
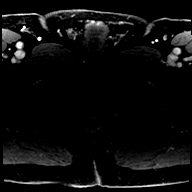

[Series 37: T1 · axial · 3.0mm · 1.15mm/px · 1 of 28 slices shown (27 of 48)]
[im 1/28]
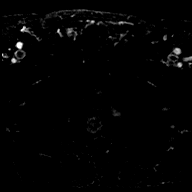

[Series 38: T1 · axial · 3.0mm · 1.15mm/px · 1 of 28 slices shown (28 of 48)]
[im 1/28]
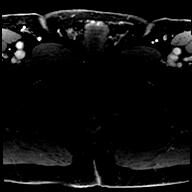

[Series 39: T1 · axial · 3.0mm · 1.15mm/px · 1 of 28 slices shown (29 of 48)]
[im 1/28]
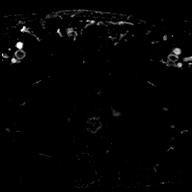

[Series 40: T1 · axial · 3.0mm · 1.15mm/px · 1 of 28 slices shown (30 of 48)]
[im 1/28]
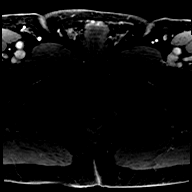

[Series 41: T1 · axial · 3.0mm · 1.15mm/px · 1 of 28 slices shown (31 of 48)]
[im 1/28]
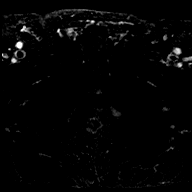

[Series 42: T1 · axial · 3.0mm · 1.15mm/px · 1 of 28 slices shown (32 of 48)]
[im 1/28]
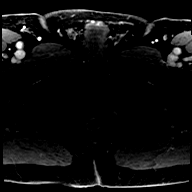

[Series 43: T1 · axial · 3.0mm · 1.15mm/px · 1 of 28 slices shown (33 of 48)]
[im 1/28]
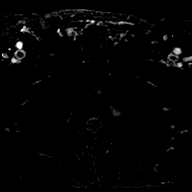

[Series 44: T1 · axial · 3.0mm · 1.15mm/px · 1 of 28 slices shown (34 of 48)]
[im 1/28]
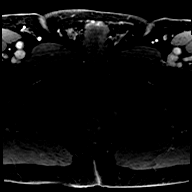

[Series 45: T1 · axial · 3.0mm · 1.15mm/px · 1 of 28 slices shown (35 of 48)]
[im 1/28]
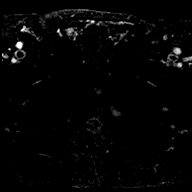

[Series 46: T1 · axial · 3.0mm · 1.15mm/px · 1 of 28 slices shown (36 of 48)]
[im 1/28]
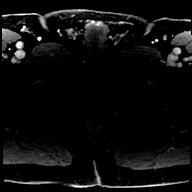

[Series 47: T1 · axial · 3.0mm · 1.15mm/px · 1 of 28 slices shown (37 of 48)]
[im 1/28]
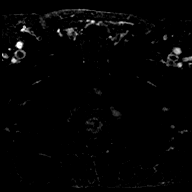

[Series 48: T1 · axial · 3.0mm · 1.15mm/px · 1 of 28 slices shown (38 of 48)]
[im 1/28]
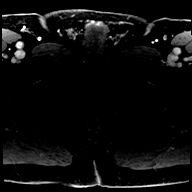

[Series 49: T1 · axial · 3.0mm · 1.15mm/px · 1 of 28 slices shown (39 of 48)]
[im 1/28]
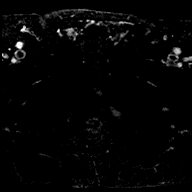

[Series 50: T1 · axial · 3.0mm · 1.15mm/px · 1 of 28 slices shown (40 of 48)]
[im 1/28]
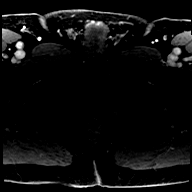

[Series 51: T1 · axial · 3.0mm · 1.15mm/px · 1 of 28 slices shown (41 of 48)]
[im 1/28]
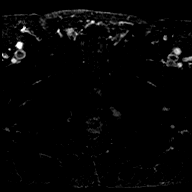

[Series 52: T1 · axial · 3.0mm · 1.15mm/px · 1 of 28 slices shown (42 of 48)]
[im 1/28]
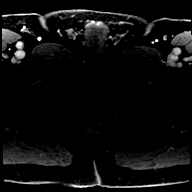

[Series 53: T1 · axial · 3.0mm · 1.15mm/px · 1 of 28 slices shown (43 of 48)]
[im 1/28]
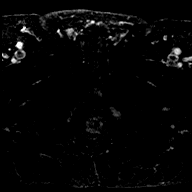

[Series 54: T1 · axial · 3.0mm · 1.15mm/px · 1 of 28 slices shown (44 of 48)]
[im 1/28]
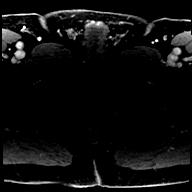

[Series 55: T1 · axial · 3.0mm · 1.15mm/px · 1 of 28 slices shown (45 of 48)]
[im 1/28]
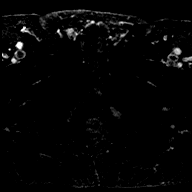

[Series 56: T1 · axial · 3.0mm · 1.15mm/px · 1 of 28 slices shown (46 of 48)]
[im 1/28]
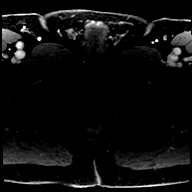

[Series 57: T1 · axial · 3.0mm · 1.15mm/px · 1 of 28 slices shown (47 of 48)]
[im 1/28]
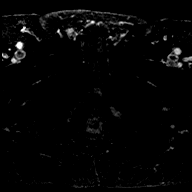

[Series 58: T1 · axial · 3.0mm · 1.15mm/px · 1 of 28 slices shown (48 of 48)]
[im 1/28]
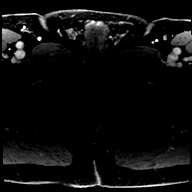

[56 of 56 positions shown; findings below may reference images not displayed]

FINDINGS: Prostate:

Transitional zone: Signs of BPH throughout the transitional zone. No
high-risk lesion.

Peripheral zone: Thinning of the peripheral zone in the setting of
hypertrophy of the central gland. Generalized indistinct and linear
and wedge-shaped T2 hypointensity throughout the peripheral zone no
distinct high-risk lesion.

Volume: 5.0 x 4.2 x 5.4 (volume = 59 cc) cm

Transcapsular spread:  Absent

Seminal vesicle involvement: Absent

Neurovascular bundle involvement: Absent

Pelvic adenopathy: Absent

Bone metastasis: Absent

Other findings: Colonic diverticulosis.
IMPRESSION: Changes of BPH and probable sequela of prior prostatitis without
signs of high-risk lesion. Overall assessment PIRADS category 2.

## 2023-05-06 DIAGNOSIS — L570 Actinic keratosis: Secondary | ICD-10-CM | POA: Diagnosis not present

## 2023-05-20 DIAGNOSIS — Z85828 Personal history of other malignant neoplasm of skin: Secondary | ICD-10-CM | POA: Diagnosis not present

## 2023-05-20 DIAGNOSIS — H269 Unspecified cataract: Secondary | ICD-10-CM | POA: Diagnosis not present

## 2023-05-20 DIAGNOSIS — I10 Essential (primary) hypertension: Secondary | ICD-10-CM | POA: Diagnosis not present

## 2023-05-20 DIAGNOSIS — E785 Hyperlipidemia, unspecified: Secondary | ICD-10-CM | POA: Diagnosis not present

## 2023-05-20 DIAGNOSIS — Z8546 Personal history of malignant neoplasm of prostate: Secondary | ICD-10-CM | POA: Diagnosis not present

## 2023-05-20 DIAGNOSIS — M1990 Unspecified osteoarthritis, unspecified site: Secondary | ICD-10-CM | POA: Diagnosis not present

## 2023-05-20 DIAGNOSIS — Z8249 Family history of ischemic heart disease and other diseases of the circulatory system: Secondary | ICD-10-CM | POA: Diagnosis not present

## 2023-05-20 DIAGNOSIS — M545 Low back pain, unspecified: Secondary | ICD-10-CM | POA: Diagnosis not present

## 2023-05-20 DIAGNOSIS — N400 Enlarged prostate without lower urinary tract symptoms: Secondary | ICD-10-CM | POA: Diagnosis not present

## 2023-05-20 DIAGNOSIS — Z809 Family history of malignant neoplasm, unspecified: Secondary | ICD-10-CM | POA: Diagnosis not present

## 2023-05-20 DIAGNOSIS — Z87891 Personal history of nicotine dependence: Secondary | ICD-10-CM | POA: Diagnosis not present

## 2023-05-20 DIAGNOSIS — K219 Gastro-esophageal reflux disease without esophagitis: Secondary | ICD-10-CM | POA: Diagnosis not present

## 2023-05-27 ENCOUNTER — Inpatient Hospital Stay: Payer: Medicare Other | Attending: Radiation Oncology

## 2023-05-27 DIAGNOSIS — C61 Malignant neoplasm of prostate: Secondary | ICD-10-CM | POA: Diagnosis not present

## 2023-05-27 LAB — PSA: Prostatic Specific Antigen: 0.02 ng/mL (ref 0.00–4.00)

## 2023-06-03 ENCOUNTER — Ambulatory Visit: Payer: Medicare HMO | Admitting: Radiation Oncology

## 2023-06-08 ENCOUNTER — Ambulatory Visit: Payer: Medicare HMO | Admitting: Radiation Oncology

## 2023-06-11 ENCOUNTER — Telehealth: Payer: Self-pay | Admitting: Family Medicine

## 2023-06-11 NOTE — Telephone Encounter (Signed)
[facility] faxed refill request for the following medications:   omeprazole (PRILOSEC) 40 MG capsule    simvastatin (ZOCOR) 20 MG tablet    hydrochlorothiazide (HYDRODIURIL) 25 MG tablet    Please advise.

## 2023-06-11 NOTE — Telephone Encounter (Signed)
Pt requesting refills too soon  Omeprazole LRF 03/15/23 #90 3rf Statin LRF 12/25/22 #90 3RF Hydrochlorothiazide LRF 12/25/22 #45 3rf  All 1 year supply

## 2023-06-16 DIAGNOSIS — L570 Actinic keratosis: Secondary | ICD-10-CM | POA: Diagnosis not present

## 2023-06-16 DIAGNOSIS — L111 Transient acantholytic dermatosis [Grover]: Secondary | ICD-10-CM | POA: Diagnosis not present

## 2023-06-16 DIAGNOSIS — Z85828 Personal history of other malignant neoplasm of skin: Secondary | ICD-10-CM | POA: Diagnosis not present

## 2023-06-16 DIAGNOSIS — L300 Nummular dermatitis: Secondary | ICD-10-CM | POA: Diagnosis not present

## 2023-06-16 DIAGNOSIS — L821 Other seborrheic keratosis: Secondary | ICD-10-CM | POA: Diagnosis not present

## 2023-06-25 ENCOUNTER — Ambulatory Visit (INDEPENDENT_AMBULATORY_CARE_PROVIDER_SITE_OTHER): Payer: Medicare Other | Admitting: Family Medicine

## 2023-06-25 ENCOUNTER — Encounter: Payer: Self-pay | Admitting: Family Medicine

## 2023-06-25 VITALS — BP 98/62 | HR 69 | Resp 16 | Ht 66.0 in | Wt 175.6 lb

## 2023-06-25 DIAGNOSIS — I10 Essential (primary) hypertension: Secondary | ICD-10-CM | POA: Diagnosis not present

## 2023-06-25 DIAGNOSIS — D649 Anemia, unspecified: Secondary | ICD-10-CM

## 2023-06-25 DIAGNOSIS — Z0000 Encounter for general adult medical examination without abnormal findings: Secondary | ICD-10-CM

## 2023-06-25 DIAGNOSIS — M5430 Sciatica, unspecified side: Secondary | ICD-10-CM

## 2023-06-25 DIAGNOSIS — R7989 Other specified abnormal findings of blood chemistry: Secondary | ICD-10-CM

## 2023-06-25 MED ORDER — OMEPRAZOLE 20 MG PO CPDR: 20.0000 mg | DELAYED_RELEASE_CAPSULE | Freq: Every day | ORAL | 3 refills | Status: DC

## 2023-06-26 LAB — COMPREHENSIVE METABOLIC PANEL
ALT: 15 [IU]/L (ref 0–44)
AST: 24 [IU]/L (ref 0–40)
Albumin: 4.6 g/dL (ref 3.8–4.8)
Alkaline Phosphatase: 65 [IU]/L (ref 44–121)
BUN/Creatinine Ratio: 12 (ref 10–24)
BUN: 10 mg/dL (ref 8–27)
Bilirubin Total: 0.4 mg/dL (ref 0.0–1.2)
CO2: 24 mmol/L (ref 20–29)
Calcium: 10 mg/dL (ref 8.6–10.2)
Chloride: 99 mmol/L (ref 96–106)
Creatinine, Ser: 0.81 mg/dL (ref 0.76–1.27)
Globulin, Total: 2.2 g/dL (ref 1.5–4.5)
Glucose: 97 mg/dL (ref 70–99)
Potassium: 4.1 mmol/L (ref 3.5–5.2)
Sodium: 137 mmol/L (ref 134–144)
Total Protein: 6.8 g/dL (ref 6.0–8.5)
eGFR: 93 mL/min/{1.73_m2} (ref >59)

## 2023-06-26 LAB — LIPID PANEL
Chol/HDL Ratio: 2.6 {ratio} (ref 0.0–5.0)
Cholesterol, Total: 201 mg/dL — ABNORMAL HIGH (ref 100–199)
HDL: 77 mg/dL (ref >39)
LDL Chol Calc (NIH): 104 mg/dL — ABNORMAL HIGH (ref 0–99)
Triglycerides: 112 mg/dL (ref 0–149)
VLDL Cholesterol Cal: 20 mg/dL (ref 5–40)

## 2023-06-26 LAB — CBC
Hematocrit: 34.2 % — ABNORMAL LOW (ref 37.5–51.0)
Hemoglobin: 11.5 g/dL — ABNORMAL LOW (ref 13.0–17.7)
MCH: 30.3 pg (ref 26.6–33.0)
MCHC: 33.6 g/dL (ref 31.5–35.7)
MCV: 90 fL (ref 79–97)
Platelets: 270 10*3/uL (ref 150–450)
RBC: 3.79 x10E6/uL — ABNORMAL LOW (ref 4.14–5.80)
RDW: 12.4 % (ref 11.6–15.4)
WBC: 5.8 10*3/uL (ref 3.4–10.8)

## 2023-06-26 LAB — TSH: TSH: 2.94 u[IU]/mL (ref 0.450–4.500)

## 2023-06-27 ENCOUNTER — Encounter: Payer: Self-pay | Admitting: Family Medicine

## 2023-10-14 DIAGNOSIS — H40013 Open angle with borderline findings, low risk, bilateral: Secondary | ICD-10-CM | POA: Diagnosis not present

## 2023-10-14 DIAGNOSIS — H25813 Combined forms of age-related cataract, bilateral: Secondary | ICD-10-CM | POA: Diagnosis not present

## 2023-10-14 DIAGNOSIS — H35033 Hypertensive retinopathy, bilateral: Secondary | ICD-10-CM | POA: Diagnosis not present

## 2023-10-14 DIAGNOSIS — I10 Essential (primary) hypertension: Secondary | ICD-10-CM | POA: Diagnosis not present

## 2023-10-19 DIAGNOSIS — K080 Exfoliation of teeth due to systemic causes: Secondary | ICD-10-CM | POA: Diagnosis not present

## 2023-10-26 DIAGNOSIS — K080 Exfoliation of teeth due to systemic causes: Secondary | ICD-10-CM | POA: Diagnosis not present

## 2023-12-07 ENCOUNTER — Other Ambulatory Visit

## 2023-12-09 ENCOUNTER — Encounter: Payer: Self-pay | Admitting: Urology

## 2023-12-16 ENCOUNTER — Other Ambulatory Visit: Payer: Self-pay | Admitting: Interventional Radiology

## 2023-12-16 DIAGNOSIS — N401 Enlarged prostate with lower urinary tract symptoms: Secondary | ICD-10-CM

## 2023-12-17 ENCOUNTER — Ambulatory Visit
Admission: RE | Admit: 2023-12-17 | Discharge: 2023-12-17 | Disposition: A | Discharge status: Home / Self Care | Source: Ambulatory Visit | Attending: Interventional Radiology | Admitting: Interventional Radiology

## 2023-12-17 DIAGNOSIS — N401 Enlarged prostate with lower urinary tract symptoms: Secondary | ICD-10-CM

## 2023-12-17 HISTORY — PX: IR RADIOLOGIST EVAL & MGMT: IMG5224

## 2023-12-17 NOTE — Progress Notes (Signed)
 This encounter was conducted via the [employer] providing interactive audio and visual communication.  The patient provided verbal consent to conduct a virtual appointment.  The patient was located at their primary residence during this encounter.   Chief Complaint: Patient was seen in consultation today for BPH with LUTS at the request of Briley Bumgarner K  Referring Physician(s): Bradley Handyside K  History of Present Illness: Jason Holmes is a 73 y.o. male with a history of prostate cancer status post brachytherapy treatment.  His most recent PSAs indicate a complete response to therapy with no concern for residual disease.  Unfortunately, over the past year he had developed significant lower urinary tract symptoms in the setting of benign prostatic hyperplasia.  He was on both tamsulosin  and finasteride .  Unfortunately, the finasteride  has had a detrimental effect on his libido which he finds quite distressing.   He filled out the international prostate symptom severity score in our office.  He scored 27 out of 35 on the symptom severity score consistent with severe symptoms.  Additionally, his quality of life is significantly impacted as he scored a 4 out of 6 and reports that he would be mostly dissatisfied if he did not get symptom relief.   He underwent prostatic artery embolization on 09/02/2022.  We spoke over videochat today for his 1 year follow-up evaluation.  He reports that he continues to do exceptionally well.  He reports that he continues to pee like a horse and remains off his Flomax .   He has no complaints at this time and is extremely happy with his results.  Past Medical History:  Diagnosis Date   Anemia    Basal cell carcinoma (BCC) of left nasal sidewall 2023   MOHS Surgery [facility] 03/31/2022   Elevated PSA    Elevated TSH    Erectile dysfunction    Excessive drinking of alcohol    GERD (gastroesophageal reflux disease)    Peptic ulcer    Prostate  cancer [facility])    Sciatica    right    Past Surgical History:  Procedure Laterality Date   COLONOSCOPY N/A 02/24/2017   Procedure: COLONOSCOPY;  Surgeon: Unk Corinn Skiff, MD;  Location[facility];  Service: Endoscopy;  Laterality: N/A;   IR EMBO TUMOR ORGAN ISCHEMIA INFARCT INC GUIDE ROADMAPPING  09/02/2022   IR RADIOLOGIST EVAL & MGMT  07/30/2022   IR RADIOLOGIST EVAL & MGMT  10/01/2022   IR RADIOLOGIST EVAL & MGMT  01/19/2023   IR RADIOLOGIST EVAL & MGMT  12/17/2023   MOHS SURGERY Left 03/31/2022   [facility]   NASAL FRACTURE SURGERY  2005   PROSTATE BIOPSY     PROSTATE BIOPSY N/A 03/05/2020   Procedure: BIOPSY TRANSRECTAL ULTRASONIC PROSTATE (TUBP);  Surgeon: Twylla Glendia BROCKS, MD;  Location: [facility];  Service: Urology;  Laterality: N/A;   PROSTATE BIOPSY N/A 11/04/2021   Procedure: PROSTATE BIOPSY;  Surgeon: Twylla Glendia BROCKS, MD;  Location: [facility];  Service: Urology;  Laterality: N/A;   RADIOACTIVE SEED IMPLANT N/A 01/27/2022   Procedure: RADIOACTIVE SEED IMPLANT/BRACHYTHERAPY IMPLANT/CYSTOSCOPY/ BLADDER BIOPSY/FULGERATION;  Surgeon: Francisca Redell BROCKS, MD;  Location: [facility];  Service: Urology;  Laterality: N/A;   TONSILLECTOMY  1972   TRANSRECTAL ULTRASOUND N/A 11/04/2021   Procedure: TRANSRECTAL ULTRASOUND;  Surgeon: Twylla Glendia BROCKS, MD;  Location: [facility];  Service: Urology;  Laterality: N/A;    Allergies: Patient has no known allergies.  Medications: Prior to Admission medications   Medication Sig Start Date End  Date Taking? Authorizing Provider  Camphor-Menthol-Methyl Sal (SALONPAS) 3.05-30-08 % PTCH Apply 1 patch topically daily as needed (Back pain).    [provider]  diphenhydrAMINE (BENADRYL) 25 MG tablet Take 25 mg by mouth at bedtime.     [provider]  Fluocinolone Acetonide 0.01 % OIL SMARTSIG:1 Drop(s) In Ear(s) Daily PRN 06/18/23   [provider]  Glucosamine Sulfate (GNP GLUCOSAME MAXIMUM STRENGTH) 1000 MG TABS Take 1,500 mg by  mouth at bedtime.     [provider]  hydrochlorothiazide  (HYDRODIURIL ) 25 MG tablet Take 0.5 tablets (12.5 mg total) by mouth daily. 12/25/22   Gasper Nancyann BRAVO, MD  ibuprofen (ADVIL) 200 MG tablet Take 400 mg by mouth every 6 (six) hours as needed.    [provider]  Multiple Vitamin tablet Take 1 tablet by mouth daily. Silver    [provider]  omeprazole  (PRILOSEC) 20 MG capsule Take 1 capsule (20 mg total) by mouth daily. 06/25/23   Gasper Nancyann BRAVO, MD  OVER THE COUNTER MEDICATION Apply 1 application topically daily as needed (pain). leg and back pain relief  CBD cream    [provider]  simvastatin  (ZOCOR ) 20 MG tablet TAKE 1 TABLET AT BEDTIME 12/25/22   Gasper Nancyann BRAVO, MD  tadalafil  (CIALIS ) 20 MG tablet Take 1 tablet (20 mg total) by mouth daily as needed for erectile dysfunction. 04/08/23   Stoioff, Scott C, MD  triamcinolone cream (KENALOG) 0.1 % SMARTSIG:Topical 1-2 Times Daily PRN 06/18/23   [provider]  Turmeric 500 MG CAPS Take 500 mg by mouth daily.    [provider]     Family History  Problem Relation Age of Onset   Heart attack Mother    Heart attack Father    Prostate cancer Neg Hx    Kidney cancer Neg Hx     Social History   Socioeconomic History   Marital status: Married    Spouse name: Not on file   Number of children: Not on file   Years of education: Not on file   Highest education level: Bachelor's degree (e.g., BA, AB, BS)  Occupational History   Not on file  Tobacco Use   Smoking status: Former    Current packs/day: 0.00    Types: Cigarettes    Quit date: 01/30/2003    Years since quitting: 20.8   Smokeless tobacco: Current    Types: Chew  Vaping Use   Vaping status: Never Used  Substance and Sexual Activity   Alcohol use: Yes    Alcohol/week: 70.0 standard drinks of alcohol    Types: 70 Cans of beer per week    Comment: 8-10 drinks per day   Drug use: No   Sexual activity: Yes     Birth control/protection: None  Other Topics Concern   Not on file  Social History Narrative   Not on file   Social Drivers of Health   Financial Resource Strain: Low Risk  (06/25/2023)   Overall Financial Resource Strain (CARDIA)    Difficulty of Paying Living Expenses: Not hard at all  Food Insecurity: No Food Insecurity (06/25/2023)   Hunger Vital Sign    Worried About Running Out of Food in the Last Year: Never true    Ran Out of Food in the Last Year: Never true  Transportation Needs: No Transportation Needs (06/25/2023)   PRAPARE - Administrator, Civil Service (Medical): No    Lack of Transportation (Non-Medical): No  Physical Activity: Sufficiently Active (06/25/2023)   Exercise Vital Sign    Days of Exercise per Week: 5 days    Minutes of Exercise per Session: 30 min  Stress: No Stress Concern Present (06/25/2023)   [employer] of Occupational Health - Occupational Stress Questionnaire    Feeling of Stress : Not at all  Social Connections: Moderately Isolated (06/25/2023)   Social Connection and Isolation Panel    Frequency of Communication with Friends and Family: Three times a week    Frequency of Social Gatherings with Friends and Family: Once a week    Attends Religious Services: Never    Database administrator or Organizations: No    Attends Engineer, structural: Not on file    Marital Status: Married      Review of Systems Review of Systems: A 12 point ROS discussed and pertinent positives are indicated in the HPI above.  All other systems are negative.  Vital Signs: There were no vitals taken for this visit.   Physical Exam Constitutional:      General: He is not in acute distress.    Appearance: Normal appearance. He is normal weight.  HENT:     Head: Normocephalic and atraumatic.  Eyes:     General: No scleral icterus. Pulmonary:     Effort: Pulmonary effort is normal.  Neurological:     Mental Status: He is alert and  oriented to person, place, and time.  Psychiatric:        Mood and Affect: Mood normal.        Behavior: Behavior normal.      Imaging: IR Radiologist Eval & Mgmt Result Date: 12/17/2023 EXAM: NEW PATIENT OFFICE VISIT CHIEF COMPLAINT: SEE NOTE IN EPIC HISTORY OF PRESENT ILLNESS: SEE NOTE IN EPIC REVIEW OF SYSTEMS: SEE NOTE IN EPIC PHYSICAL EXAMINATION: SEE NOTE IN EPIC ASSESSMENT AND PLAN: SEE NOTE IN EPIC Electronically Signed   By: Wilkie Lent M.D.   On: 12/17/2023 13:55    Labs:  CBC: Recent Labs    06/25/23 1356  WBC 5.8  HGB 11.5*  HCT 34.2*  PLT 270    COAGS: No results for input(s): INR, APTT in the last 8760 hours.  BMP: Recent Labs    06/25/23 1356  NA 137  K 4.1  CL 99  CO2 24  GLUCOSE 97  BUN 10  CALCIUM 10.0  CREATININE 0.81    LIVER FUNCTION TESTS: Recent Labs    06/25/23 1356  BILITOT 0.4  AST 24  ALT 15  ALKPHOS 65  PROT 6.8  ALBUMIN 4.6    TUMOR MARKERS: No results for input(s): AFPTM, CEA, CA199, CHROMGRNA in the last 8760 hours.  Assessment and Plan:  73 year-old male doing exceptionally well 1 year post PAE.  His LUTS remain resolved.   - No further scheduled follow-up.  He will reach out if he develops recurrent symptoms.     Electronically Signed: Wilkie MARLA Lent 12/17/2023, 3:36 PM   This encounter was conducted via the [employer] providing interactive audio and visual communication.  The patient provided verbal consent to conduct a virtual appointment.  The patient was located at their primary residence during this encounter.   I spent a total of   15 Minutes in face to face in clinical consultation, greater than 50% of which was counseling/coordinating care for BPH with LUTS

## 2023-12-23 ENCOUNTER — Other Ambulatory Visit: Payer: Self-pay | Admitting: Family Medicine

## 2023-12-23 DIAGNOSIS — E782 Mixed hyperlipidemia: Secondary | ICD-10-CM

## 2023-12-23 DIAGNOSIS — I10 Essential (primary) hypertension: Secondary | ICD-10-CM

## 2023-12-23 NOTE — Telephone Encounter (Signed)
 Copied from CRM [id number]. Topic: Clinical - Prescription Issue >> Dec 23, 2023  8:51 AM Willma R wrote: Reason for CRM: Patient requested a refill for simvastatin  (ZOCOR ) 20 MG tablet, but states he will be out next week and will be out of town. Is asking if there is any way to get just a 30 day prescription sent to:  [id number] [address] [place], [place] - [address] AT [facility] [address] [place] [address] Phone: [phone] Fax: [phone]  Patient can be reached at [phone]

## 2023-12-23 NOTE — Telephone Encounter (Unsigned)
 Copied from CRM [id number]. Topic: Clinical - Medication Refill >> Dec 23, 2023  8:48 AM Willma R wrote: Medication: simvastatin  (ZOCOR ) 20 MG tablet hydrochlorothiazide  (HYDRODIURIL ) 25 MG tablet  Has the patient contacted their pharmacy? Yes, call dr  This is the patient's preferred pharmacy:  [facility] - [place], [place] - [address] [facility] [address] [facility] [place] [address] Phone: [phone] Fax: [phone]  Is this the correct pharmacy for this prescription? Yes If no, delete pharmacy and type the correct one.   Has the prescription been filled recently? No  Is the patient out of the medication? No  Has the patient been seen for an appointment in the last year OR does the patient have an upcoming appointment? Yes  Can we respond through MyChart? Yes  Agent: Please be advised that Rx refills may take up to 3 business days. We ask that you follow-up with your pharmacy.

## 2023-12-24 MED ORDER — HYDROCHLOROTHIAZIDE 25 MG PO TABS: 12.5000 mg | ORAL_TABLET | Freq: Every day | ORAL | 0 refills | Status: DC

## 2023-12-24 MED ORDER — SIMVASTATIN 20 MG PO TABS: 20.0000 mg | ORAL_TABLET | Freq: Every day | ORAL | 0 refills | Status: DC

## 2023-12-24 MED ORDER — SIMVASTATIN 20 MG PO TABS
20.0000 mg | ORAL_TABLET | Freq: Every day | ORAL | 0 refills | Status: DC
Start: 2023-12-24 — End: 2023-12-24

## 2023-12-24 NOTE — Telephone Encounter (Signed)
 LOV 06/19/23 for CPE,OFFICE VISIT NEEDED FOR ADDITIONAL REFILLS .  Requested Prescriptions  Pending Prescriptions Disp Refills   simvastatin  (ZOCOR ) 20 MG tablet 90 tablet 0    Sig: Take 1 tablet (20 mg total) by mouth at bedtime.     Cardiovascular:  Antilipid - Statins Failed - 12/24/2023  8:07 AM      Failed - Valid encounter within last 12 months    Recent Outpatient Visits   None            Failed - Lipid Panel in normal range within the last 12 months    Cholesterol, Total  Date Value Ref Range Status  06/25/2023 201 (H) 100 - 199 mg/dL Final   LDL Cholesterol (Calc)  Date Value Ref Range Status  04/22/2017 103 (H) mg/dL (calc) Final    Comment:    Reference range: <100 . Desirable range <100 mg/dL for primary prevention;   <70 mg/dL for patients with CHD or diabetic patients  with > or = 2 CHD risk factors. SABRA LDL-C is now calculated using the Martin-Hopkins  calculation, which is a validated novel method providing  better accuracy than the Friedewald equation in the  estimation of LDL-C.  Gladis APPLETHWAITE et al. SANDREA. 7986;689(80): 2061-2068  (http://education.[facility].com/faq/FAQ164)    LDL Chol Calc (NIH)  Date Value Ref Range Status  06/25/2023 104 (H) 0 - 99 mg/dL Final   HDL  Date Value Ref Range Status  06/25/2023 77 >39 mg/dL Final   Triglycerides  Date Value Ref Range Status  06/25/2023 112 0 - 149 mg/dL Final         Passed - Patient is not pregnant       hydrochlorothiazide  (HYDRODIURIL ) 25 MG tablet 45 tablet 0    Sig: Take 0.5 tablets (12.5 mg total) by mouth daily.     Cardiovascular: Diuretics - Thiazide Failed - 12/24/2023  8:07 AM      Failed - Cr in normal range and within 180 days    Creat  Date Value Ref Range Status  04/22/2017 0.80 0.70 - 1.25 mg/dL Final    Comment:    For patients >54 years of age, the reference limit for Creatinine is approximately 13% higher for people identified as African-American. .    Creatinine,  Ser  Date Value Ref Range Status  06/25/2023 0.81 0.76 - 1.27 mg/dL Final         Failed - K in normal range and within 180 days    Potassium  Date Value Ref Range Status  06/25/2023 4.1 3.5 - 5.2 mmol/L Final         Failed - Na in normal range and within 180 days    Sodium  Date Value Ref Range Status  06/25/2023 137 134 - 144 mmol/L Final         Failed - Valid encounter within last 6 months    Recent Outpatient Visits   None            Passed - Last BP in normal range    BP Readings from Last 1 Encounters:  06/25/23 98/62

## 2023-12-24 NOTE — Telephone Encounter (Unsigned)
 Copied from CRM [id number]. Topic: Clinical - Medication Question >> Dec 24, 2023 10:26 AM [name] wrote: Reason for CRM: Patient is calling in because his medication was sent to the [facility]. Patient wants to know if he can have some medication sent to the local [facility] because he is completely out and will be out of town next week and it will take a few weeks before he gets the simvastatin . Patient is requesting that the Simvastatin  be [facility] [address] in [place].

## 2023-12-24 NOTE — Telephone Encounter (Signed)
 Needs appointment

## 2023-12-24 NOTE — Telephone Encounter (Signed)
 Duplicate request, refilled 12/24/23.  Requested Prescriptions  Pending Prescriptions Disp Refills   simvastatin  (ZOCOR ) 20 MG tablet 90 tablet 3    Sig: Take 1 tablet (20 mg total) by mouth at bedtime.     Cardiovascular:  Antilipid - Statins Failed - 12/24/2023  9:24 AM      Failed - Valid encounter within last 12 months    Recent Outpatient Visits   None            Failed - Lipid Panel in normal range within the last 12 months    Cholesterol, Total  Date Value Ref Range Status  06/25/2023 201 (H) 100 - 199 mg/dL Final   LDL Cholesterol (Calc)  Date Value Ref Range Status  04/22/2017 103 (H) mg/dL (calc) Final    Comment:    Reference range: <100 . Desirable range <100 mg/dL for primary prevention;   <70 mg/dL for patients with CHD or diabetic patients  with > or = 2 CHD risk factors. SABRA LDL-C is now calculated using the Martin-Hopkins  calculation, which is a validated novel method providing  better accuracy than the Friedewald equation in the  estimation of LDL-C.  Gladis APPLETHWAITE et al. SANDREA. 7986;689(80): 2061-2068  (http://education.[facility].com/faq/FAQ164)    LDL Chol Calc (NIH)  Date Value Ref Range Status  06/25/2023 104 (H) 0 - 99 mg/dL Final   HDL  Date Value Ref Range Status  06/25/2023 77 >39 mg/dL Final   Triglycerides  Date Value Ref Range Status  06/25/2023 112 0 - 149 mg/dL Final         Passed - Patient is not pregnant

## 2023-12-24 NOTE — Addendum Note (Signed)
 Addended by: GASPER NANCYANN BRAVO on: 12/24/2023 01:58 PM   Modules accepted: Orders

## 2023-12-28 ENCOUNTER — Telehealth: Payer: Self-pay

## 2023-12-28 DIAGNOSIS — N401 Enlarged prostate with lower urinary tract symptoms: Secondary | ICD-10-CM

## 2023-12-28 MED ORDER — TADALAFIL 20 MG PO TABS: 20.0000 mg | ORAL_TABLET | Freq: Every day | ORAL | 0 refills | Status: DC | PRN

## 2023-12-28 NOTE — Telephone Encounter (Signed)
 Resent patient to mail in pharmacy for tadalifil

## 2023-12-29 ENCOUNTER — Other Ambulatory Visit: Payer: Self-pay

## 2023-12-29 DIAGNOSIS — N401 Enlarged prostate with lower urinary tract symptoms: Secondary | ICD-10-CM

## 2023-12-29 MED ORDER — TADALAFIL 20 MG PO TABS: 20.0000 mg | ORAL_TABLET | Freq: Every day | ORAL | 0 refills | Status: AC | PRN

## 2024-02-17 ENCOUNTER — Other Ambulatory Visit: Payer: Self-pay | Admitting: Urology

## 2024-02-21 ENCOUNTER — Other Ambulatory Visit: Admitting: Urology

## 2024-03-01 ENCOUNTER — Encounter: Payer: Self-pay | Admitting: Urology

## 2024-03-01 ENCOUNTER — Ambulatory Visit (INDEPENDENT_AMBULATORY_CARE_PROVIDER_SITE_OTHER): Admitting: Urology

## 2024-03-01 VITALS — BP 148/77 | HR 69 | Ht 66.0 in | Wt 165.0 lb

## 2024-03-01 DIAGNOSIS — N401 Enlarged prostate with lower urinary tract symptoms: Secondary | ICD-10-CM | POA: Diagnosis not present

## 2024-03-01 DIAGNOSIS — R3916 Straining to void: Secondary | ICD-10-CM | POA: Diagnosis not present

## 2024-03-01 DIAGNOSIS — Z8551 Personal history of malignant neoplasm of bladder: Secondary | ICD-10-CM

## 2024-03-01 LAB — URINALYSIS, COMPLETE
Bilirubin, UA: NEGATIVE
Glucose, UA: NEGATIVE
Ketones, UA: NEGATIVE
Leukocytes,UA: NEGATIVE
Nitrite, UA: NEGATIVE
Protein,UA: NEGATIVE
RBC, UA: NEGATIVE
Specific Gravity, UA: <1.005 — ABNORMAL LOW (ref 1.005–1.030)
Urobilinogen, Ur: 0.2 mg/dL (ref 0.2–1.0)
pH, UA: 6 (ref 5.0–7.5)

## 2024-03-01 LAB — MICROSCOPIC EXAMINATION: Bacteria, UA: NONE SEEN

## 2024-03-01 NOTE — Progress Notes (Signed)
   03/01/2024 10:56 AM   Jason Holmes 1950-12-07 [id number]  Referring provider: Gasper Nancyann BRAVO, MD [address],  [place] [address]  Chief Complaint  Patient presents with   Cysto   Urologic history:  Ta urothelial carcinoma bladder-LG Incidental 1 cm papillary tumor identified on post brachytherapy cystoscopy 01/2022  2.  Prostate cancer Prostate biopsy April 2018 for PSA of 6.8/right prostate nodule; prostate volume 44.5 cc; pathology HGPIN and focus ASAP Prostate MRI 06/2017 consistent with BPH/no suspicious lesions Repeat biopsy 03/05/2020 for PSA 10.9; 38 cc volume; 3/12 cores positive Gleason 3+3 adenocarcinoma all involving <50% of submitted tissue; active surveillance elected PSA bump 15.28 Sep 2021; confirmatory biopsy 1/12 cores positive Gleason 4+3 adenocarcinoma (25%); PSMA/PET negative for metastatic disease Elected brachytherapy performed 01/27/2022  3.  BPH with LUTS PAE 09/02/2022   HPI: No complaints since last visit.  Denies dysuria, gross hematuria.  UA today clear  Blood pressure (!) 148/77, pulse 69, height 5' 6 (1.676 m), weight 165 lb (74.8 kg). NED. A&Ox3.   No respiratory distress   Abd soft, NT, ND Normal phallus with bilateral descended testicles  Cystoscopy Procedure Note  Patient identification was confirmed, informed consent was obtained, and patient was prepped using Betadine solution.  Lidocaine  jelly was administered per urethral meatus.    Pre-Procedure: - Inspection reveals a normal caliber urethral meatus.   Procedure: The flexible cystoscope was introduced without difficulty - No urethral strictures/lesions are present. - Prominent lateral lobe enlargement with hypervascularity prostate  - Moderate elevation bladder neck - Bilateral ureteral orifices identified - Bladder mucosa  reveals no ulcers, tumors, or lesions - No bladder stones - No trabeculation   Retroflexion shows no abnormalities      Post-Procedure: - Patient tolerated the procedure well  Assessment/ Plan: No evidence recurrent bladder tumor Follow-up surveillance cystoscopy 1 year Last PSA in radiation oncology was 0.27 May 2023 States he does not have a follow-up radiation oncology appointment Since cystoscopy done today will schedule a 6-week lab visit for PSA    Jason JAYSON Barba, MD

## 2024-03-17 ENCOUNTER — Telehealth: Payer: Self-pay | Admitting: Family Medicine

## 2024-03-17 NOTE — Telephone Encounter (Addendum)
 [facility] refill request for the following medications:  hydrochlorothiazide  (HYDRODIURIL ) 25 MG tablet   simvastatin  (ZOCOR ) 20 MG tablet    Please advise.

## 2024-03-20 ENCOUNTER — Other Ambulatory Visit: Payer: Self-pay | Admitting: Medical Genetics

## 2024-03-20 ENCOUNTER — Encounter: Payer: Self-pay | Admitting: Family Medicine

## 2024-03-20 DIAGNOSIS — I10 Essential (primary) hypertension: Secondary | ICD-10-CM

## 2024-03-20 DIAGNOSIS — Z006 Encounter for examination for normal comparison and control in clinical research program: Secondary | ICD-10-CM

## 2024-03-20 DIAGNOSIS — E782 Mixed hyperlipidemia: Secondary | ICD-10-CM

## 2024-03-20 MED ORDER — HYDROCHLOROTHIAZIDE 25 MG PO TABS: 12.5000 mg | ORAL_TABLET | Freq: Every day | ORAL | 1 refills | Status: DC

## 2024-03-20 MED ORDER — SIMVASTATIN 20 MG PO TABS: 20.0000 mg | ORAL_TABLET | Freq: Every day | ORAL | 0 refills | Status: DC

## 2024-03-20 NOTE — Telephone Encounter (Signed)
 Converted into a refill request and sent prescriptions in.

## 2024-04-13 ENCOUNTER — Other Ambulatory Visit

## 2024-04-13 DIAGNOSIS — Z8551 Personal history of malignant neoplasm of bladder: Secondary | ICD-10-CM | POA: Diagnosis not present

## 2024-04-14 LAB — PSA: Prostate Specific Ag, Serum: <0.1 ng/mL (ref 0.0–4.0)

## 2024-04-17 ENCOUNTER — Ambulatory Visit: Payer: Self-pay | Admitting: Urology

## 2024-04-19 ENCOUNTER — Telehealth: Payer: Self-pay | Admitting: Acute Care

## 2024-04-19 DIAGNOSIS — Z87891 Personal history of nicotine dependence: Secondary | ICD-10-CM

## 2024-04-19 DIAGNOSIS — G4700 Insomnia, unspecified: Secondary | ICD-10-CM | POA: Diagnosis not present

## 2024-04-19 DIAGNOSIS — Z122 Encounter for screening for malignant neoplasm of respiratory organs: Secondary | ICD-10-CM

## 2024-04-19 NOTE — Telephone Encounter (Signed)
 Lung Cancer Screening Narrative/Criteria Questionnaire (Cigarette Smokers Only- No Cigars/Pipes/vapes)   Jason Holmes   SDMV:05/03/2024 10:30a [place]       Jun 12, 1950   LDCT: 05/04/2024 10:30 OPIC    73 y.o.   Phone: [phone]  Lung Screening Narrative (confirm age 37-77 yrs Medicare / 50-80 yrs Private pay insurance)   Insurance information:BCBS mcr   Referring Provider:Self referral, referral order placed under PCP  This screening involves an initial phone call with a team member from our program. It is called a shared decision making visit. The initial meeting is required by  insurance and Medicare to make sure you understand the program. This appointment takes about 15-20 minutes to complete. You will complete the screening scan at your scheduled date/time.  This scan takes about 5-10 minutes to complete. You can eat and drink normally before and after the scan.  Criteria questions for Lung Cancer Screening:   Are you a current or former smoker? Former Age began smoking: 73yo   If you are a former smoker, what year did you quit smoking? 2013(within 15 yrs)   To calculate your smoking history, I need an accurate estimate of how many packs of cigarettes you smoked per day and for how many years. (Not just the number of PPD you are now smoking)   Years smoking 48 x Packs per day 1 = Pack years 48   (at least 20 pack yrs)   (Make sure they understand that we need to know how much they have smoked in the past, not just the number of PPD they are smoking now)  Do you have a personal history of cancer?  Yes - (type and when diagnosed - 5 yrs cancer free) prostate dx in 2022, radiation seeds implanted in 2023    Do you have a family history of cancer? Yes  (cancer type and and relative) mother - breast  Are you coughing up blood?  No  Have you had unexplained weight loss of 15 lbs or more in the last 6 months? No  It looks like you meet all criteria.  When would be a good time  for us  to schedule you for this screening?   Additional information: N/A

## 2024-04-24 ENCOUNTER — Encounter: Payer: Self-pay | Admitting: Family Medicine

## 2024-04-25 DIAGNOSIS — K080 Exfoliation of teeth due to systemic causes: Secondary | ICD-10-CM | POA: Diagnosis not present

## 2024-05-03 ENCOUNTER — Ambulatory Visit (INDEPENDENT_AMBULATORY_CARE_PROVIDER_SITE_OTHER): Admitting: Adult Health

## 2024-05-03 ENCOUNTER — Encounter: Payer: Self-pay | Admitting: Adult Health

## 2024-05-03 DIAGNOSIS — Z87891 Personal history of nicotine dependence: Secondary | ICD-10-CM | POA: Diagnosis not present

## 2024-05-03 NOTE — Patient Instructions (Signed)

## 2024-05-03 NOTE — Progress Notes (Signed)
°  Virtual Visit via Telephone Note  I connected with Jason Holmes , 05/03/24 10:40 AM by a telemedicine application and verified that I am speaking with the correct person using two identifiers.  Location: Patient: home Provider: home   I discussed the limitations of evaluation and management by telemedicine and the availability of in person appointments. The patient expressed understanding and agreed to proceed.   Shared Decision Making Visit Lung Cancer Screening Program [id number])   Eligibility: 73 y.o. Pack Years Smoking History Calculation =48 pack years  (# packs/per year x # years smoked) Recent History of coughing up blood  no Unexplained weight loss? no ( >Than 15 pounds within the last 6 months ) Prior History Lung / other cancer no (Diagnosis within the last 5 years already requiring surveillance chest CT Scans). Smoking Status Former Smoker Former Smokers: Years since quit: 12 years  Quit Date: 2013   Visit Components: Discussion included one or more decision making aids. YES Discussion included risk/benefits of screening. YES Discussion included potential follow up diagnostic testing for abnormal scans. YES Discussion included meaning and risk of over diagnosis. YES Discussion included meaning and risk of False Positives. YES Discussion included meaning of total radiation exposure. YES  Counseling Included: Importance of adherence to annual lung cancer LDCT screening. YES Impact of comorbidities on ability to participate in the program. YES Ability and willingness to under diagnostic treatment. YES  Smoking Cessation Counseling: Former Smokers:  Discussed the importance of maintaining cigarette abstinence. yes Diagnosis Code: Personal History of Nicotine Dependence. S12.108 Information about tobacco cessation classes and interventions provided to patient. Yes Patient provided with ticket for LDCT Scan. yes Written Order for Lung Cancer Screening with  LDCT placed in Epic. Yes (CT Chest Lung Cancer Screening Low Dose W/O CM) [id number] Z12.2-Screening of respiratory organs Z87.891-Personal history of nicotine dependence   Jason Holmes 05/03/24

## 2024-05-04 ENCOUNTER — Ambulatory Visit
Admission: RE | Admit: 2024-05-04 | Discharge: 2024-05-04 | Disposition: A | Discharge status: Home / Self Care | Source: Ambulatory Visit | Attending: Acute Care | Admitting: Acute Care

## 2024-05-04 DIAGNOSIS — Z122 Encounter for screening for malignant neoplasm of respiratory organs: Secondary | ICD-10-CM | POA: Diagnosis present

## 2024-05-04 DIAGNOSIS — Z87891 Personal history of nicotine dependence: Secondary | ICD-10-CM | POA: Diagnosis not present

## 2024-05-09 ENCOUNTER — Telehealth: Payer: Self-pay | Admitting: *Deleted

## 2024-05-09 DIAGNOSIS — Z87891 Personal history of nicotine dependence: Secondary | ICD-10-CM

## 2024-05-09 DIAGNOSIS — Z122 Encounter for screening for malignant neoplasm of respiratory organs: Secondary | ICD-10-CM

## 2024-05-09 NOTE — Telephone Encounter (Signed)
 Spoke with pt and reviewed lung screening results. No suspicious lung nodules seen. New Aortic aneurysm noted. Pt has not seen thoracic in the past. Pt advised that I will send results to Dr Gasper with notation regarding findings. PT will call Dr Lyle office in a few days if he has not heard back regarding follow up needed. Results sent to PCP. Order placed for 12 month lung screening CT.

## 2024-05-12 NOTE — Progress Notes (Signed)
 CURRIE DENNIN                                          MRN: [id number]   05/12/2024   The VBCI Quality Team Specialist reviewed this patient medical record for the purposes of chart review for care gap closure. The following were reviewed: chart review for care gap closure-controlling blood pressure.    VBCI Quality Team

## 2024-05-16 ENCOUNTER — Encounter: Payer: Self-pay | Admitting: Family Medicine

## 2024-05-16 DIAGNOSIS — I7121 Aneurysm of the ascending aorta, without rupture: Secondary | ICD-10-CM | POA: Insufficient documentation

## 2024-05-16 DIAGNOSIS — N281 Cyst of kidney, acquired: Secondary | ICD-10-CM | POA: Insufficient documentation

## 2024-05-23 ENCOUNTER — Telehealth: Payer: Self-pay | Admitting: Family Medicine

## 2024-05-23 ENCOUNTER — Other Ambulatory Visit: Payer: Self-pay

## 2024-05-23 MED ORDER — OMEPRAZOLE 20 MG PO CPDR: 20.0000 mg | DELAYED_RELEASE_CAPSULE | Freq: Every day | ORAL | 3 refills | Status: DC

## 2024-05-23 NOTE — Telephone Encounter (Signed)
 [facility] refill request for the following medications:  omeprazole  (PRILOSEC) 20 MG capsule     Please advise.

## 2024-05-29 ENCOUNTER — Other Ambulatory Visit: Payer: Self-pay | Admitting: *Deleted

## 2024-05-29 ENCOUNTER — Other Ambulatory Visit: Payer: Self-pay

## 2024-05-29 ENCOUNTER — Encounter: Payer: Self-pay | Admitting: Urology

## 2024-05-29 ENCOUNTER — Encounter: Payer: Self-pay | Admitting: Family Medicine

## 2024-05-29 DIAGNOSIS — N401 Enlarged prostate with lower urinary tract symptoms: Secondary | ICD-10-CM

## 2024-05-29 DIAGNOSIS — I10 Essential (primary) hypertension: Secondary | ICD-10-CM

## 2024-05-29 DIAGNOSIS — E782 Mixed hyperlipidemia: Secondary | ICD-10-CM

## 2024-05-29 MED ORDER — HYDROCHLOROTHIAZIDE 25 MG PO TABS: 12.5000 mg | ORAL_TABLET | Freq: Every day | ORAL | 1 refills | Status: AC

## 2024-05-29 MED ORDER — OMEPRAZOLE 20 MG PO CPDR: 20.0000 mg | DELAYED_RELEASE_CAPSULE | Freq: Every day | ORAL | 3 refills | Status: AC

## 2024-05-29 MED ORDER — SIMVASTATIN 20 MG PO TABS: 20.0000 mg | ORAL_TABLET | Freq: Every day | ORAL | 0 refills | Status: AC

## 2024-05-29 NOTE — Telephone Encounter (Signed)
 Talked with patient and he [facility] . So [facility] are [facility]il .

## 2024-06-09 ENCOUNTER — Telehealth: Payer: Self-pay | Admitting: Family Medicine

## 2024-06-09 NOTE — Telephone Encounter (Signed)
 Duplicate encounter. Refill was sent 05/30/24 for 90 days requested too soon for refill.

## 2024-06-09 NOTE — Telephone Encounter (Signed)
 [facility] faxed refill request for the following medications:  simvastatin  (ZOCOR ) 20 MG tablet      Please advise.

## 2024-06-22 ENCOUNTER — Encounter (INDEPENDENT_AMBULATORY_CARE_PROVIDER_SITE_OTHER): Payer: Medicare (Managed Care) | Admitting: Vascular Surgery

## 2024-06-26 DIAGNOSIS — I739 Peripheral vascular disease, unspecified: Secondary | ICD-10-CM | POA: Insufficient documentation

## 2024-06-26 NOTE — Progress Notes (Unsigned)
 "                                                                      MRN : [id number]  Jason Holmes is a 74 y.o. (1950-07-27) male who presents with chief complaint of check circulation.  History of Present Illness:   The patient presents to the office for evaluation of an ascending thoracic aortic aneurysm. The aneurysm was found incidentally by CT scan. Patient denies chest pain or unusual back pain, no other chest or abdominal complaints.  No history of an abrupt onset of a painful toe associated with blue discoloration.     No family history of TAA/AAA.   Patient denies amaurosis fugax or TIA symptoms.  There is no history of claudication or rest pain symptoms of the lower extremities.   The patient denies angina or shortness of breath.  CT scan of the chest dated 05/04/2024, is reviewed by me and shows an ascending TAA that measures 5.1 cm  CT scan of the pelvis dated 08/04/2023 is reviewed by me and shows mild atherosclerosis, no evidence of AAA    Active Medications[1]  Past Medical History:  Diagnosis Date   Anemia    Basal cell carcinoma (BCC) of left nasal sidewall 2023   MOHS Surgery [facility] 03/31/2022   Elevated PSA    Elevated TSH    Erectile dysfunction    Excessive drinking of alcohol    GERD (gastroesophageal reflux disease)    Peptic ulcer    Prostate cancer (HCC)    Renal cyst 05/16/2024   Sciatica    right    Past Surgical History:  Procedure Laterality Date   COLONOSCOPY N/A 02/24/2017   Procedure: COLONOSCOPY;  Surgeon: Unk Corinn Skiff, MD;  Location: [facility];  Service: Endoscopy;  Laterality: N/A;   IR EMBO TUMOR ORGAN ISCHEMIA INFARCT INC GUIDE ROADMAPPING  09/02/2022   IR RADIOLOGIST EVAL & MGMT  07/30/2022   IR RADIOLOGIST EVAL & MGMT  10/01/2022   IR RADIOLOGIST EVAL & MGMT  01/19/2023   IR RADIOLOGIST EVAL & MGMT  12/17/2023   MOHS SURGERY Left 03/31/2022   [facility]   NASAL FRACTURE SURGERY  2005   PROSTATE BIOPSY     PROSTATE BIOPSY  N/A 03/05/2020   Procedure: BIOPSY TRANSRECTAL ULTRASONIC PROSTATE (TUBP);  Surgeon: Twylla Glendia BROCKS, MD;  Location: [facility];  Service: Urology;  Laterality: N/A;   PROSTATE BIOPSY N/A 11/04/2021   Procedure: PROSTATE BIOPSY;  Surgeon: Twylla Glendia BROCKS, MD;  Location: [facility];  Service: Urology;  Laterality: N/A;   RADIOACTIVE SEED IMPLANT N/A 01/27/2022   Procedure: RADIOACTIVE SEED IMPLANT/BRACHYTHERAPY IMPLANT/CYSTOSCOPY/ BLADDER BIOPSY/FULGERATION;  Surgeon: Francisca Redell BROCKS, MD;  Location: [facility];  Service: Urology;  Laterality: N/A;   TONSILLECTOMY  1972   TRANSRECTAL ULTRASOUND N/A 11/04/2021   Procedure: TRANSRECTAL ULTRASOUND;  Surgeon: Twylla Glendia BROCKS, MD;  Location: [facility];  Service: Urology;  Laterality: N/A;    Social History Social History[2]  Family History Family History  Problem Relation Age of Onset   Heart attack Mother    Heart attack Father    Prostate cancer Neg Hx    Kidney cancer Neg Hx     Allergies[3]   REVIEW OF SYSTEMS (  Negative unless checked)  Constitutional: [] Weight loss  [] Fever  [] Chills Cardiac: [] Chest pain   [] Chest pressure   [] Palpitations   [] Shortness of breath when laying flat   [] Shortness of breath with exertion. Vascular:  [x] Pain in legs with walking   [] Pain in legs at rest  [] History of DVT   [] Phlebitis   [] Swelling in legs   [] Varicose veins   [] Non-healing ulcers Pulmonary:   [] Uses home oxygen   [] Productive cough   [] Hemoptysis   [] Wheeze  [] COPD   [] Asthma Neurologic:  [] Dizziness   [] Seizures   [] History of stroke   [] History of TIA  [] Aphasia   [] Vissual changes   [] Weakness or numbness in arm   [] Weakness or numbness in leg Musculoskeletal:   [] Joint swelling   [] Joint pain   [] Low back pain Hematologic:  [] Easy bruising  [] Easy bleeding   [] Hypercoagulable state   [] Anemic Gastrointestinal:  [] Diarrhea   [] Vomiting  [] Gastroesophageal reflux/heartburn   [] Difficulty swallowing. Genitourinary:  [] Chronic kidney  disease   [] Difficult urination  [] Frequent urination   [] Blood in urine Skin:  [] Rashes   [] Ulcers  Psychological:  [] History of anxiety   []  History of major depression.  Physical Examination  There were no vitals filed for this visit. There is no height or weight on file to calculate BMI. Gen: WD/WN, NAD Head: [place]/AT, No temporalis wasting.  Ear/Nose/Throat: Hearing grossly intact, nares w/o erythema or drainage Eyes: PER, EOMI, sclera nonicteric.  Neck: Supple, no masses.  No bruit or JVD.  Pulmonary:  Good air movement, no audible wheezing, no use of accessory muscles.  Cardiac: RRR, normal S1, S2, no Murmurs. Vascular:  mild trophic changes, no open wounds Vessel Right Left  Radial Palpable Palpable  PT Not Palpable Not Palpable  DP Not Palpable Not Palpable  Gastrointestinal: soft, non-distended. No guarding/no peritoneal signs.  Musculoskeletal: M/S 5/5 throughout.  No visible deformity.  Neurologic: CN 2-12 intact. Pain and light touch intact in extremities.  Symmetrical.  Speech is fluent. Motor exam as listed above. Psychiatric: Judgment intact, Mood & affect appropriate for pt's clinical situation. Dermatologic: No rashes or ulcers noted.  No changes consistent with cellulitis.   CBC Lab Results  Component Value Date   WBC 5.8 06/25/2023   HGB 11.5 (L) 06/25/2023   HCT 34.2 (L) 06/25/2023   MCV 90 06/25/2023   PLT 270 06/25/2023    BMET    Component Value Date/Time   NA 137 06/25/2023 1356   K 4.1 06/25/2023 1356   CL 99 06/25/2023 1356   CO2 24 06/25/2023 1356   GLUCOSE 97 06/25/2023 1356   GLUCOSE 94 09/02/2022 1210   BUN 10 06/25/2023 1356   CREATININE 0.81 06/25/2023 1356   CREATININE 0.80 04/22/2017 0924   CALCIUM 10.0 06/25/2023 1356   GFRNONAA >60 09/02/2022 1210   GFRNONAA 92 04/22/2017 0924   GFRAA 105 11/23/2019 1508   GFRAA 106 04/22/2017 0924   CrCl cannot be calculated (Patient's most recent lab result is older than the maximum 21 days  allowed.).  COAG Lab Results  Component Value Date   INR 1.0 09/02/2022    Radiology No results found.   Assessment/Plan There are no diagnoses linked to this encounter.   Cordella Shawl, MD  06/26/2024 11:21 AM      [1]  No outpatient medications have been marked as taking for the 06/29/24 encounter (Appointment) with Shawl, Cordella MATSU, MD.  [2]  Social History Tobacco Use   Smoking status: Former  Current packs/day: 0.00    Types: Cigarettes    Quit date: 01/30/2003    Years since quitting: 21.4   Smokeless tobacco: Current    Types: Chew  Vaping Use   Vaping status: Never Used  Substance Use Topics   Alcohol use: Yes    Alcohol/week: 70.0 standard drinks of alcohol    Types: 70 Cans of beer per week    Comment: 8-10 drinks per day   Drug use: No  [3] No Known Allergies  "

## 2024-06-28 NOTE — Progress Notes (Signed)
 Jason Holmes                                          MRN: [id number]   06/28/2024   The [facility] Quality Team Specialist reviewed this patient medical record for the purposes of chart review for care gap closure. The following were reviewed: chart review for care gap closure-controlling blood pressure.    [facility] Quality Team

## 2024-06-29 ENCOUNTER — Ambulatory Visit (INDEPENDENT_AMBULATORY_CARE_PROVIDER_SITE_OTHER): Payer: Medicare (Managed Care) | Admitting: Vascular Surgery

## 2024-06-29 ENCOUNTER — Encounter (INDEPENDENT_AMBULATORY_CARE_PROVIDER_SITE_OTHER): Payer: Self-pay | Admitting: Vascular Surgery

## 2024-06-29 VITALS — BP 124/78 | HR 70 | Resp 17 | Ht 66.0 in | Wt 171.2 lb

## 2024-06-29 DIAGNOSIS — I7121 Aneurysm of the ascending aorta, without rupture: Secondary | ICD-10-CM

## 2024-06-29 DIAGNOSIS — I10 Essential (primary) hypertension: Secondary | ICD-10-CM

## 2024-06-29 DIAGNOSIS — E782 Mixed hyperlipidemia: Secondary | ICD-10-CM

## 2024-06-29 DIAGNOSIS — I739 Peripheral vascular disease, unspecified: Secondary | ICD-10-CM

## 2024-10-17 ENCOUNTER — Other Ambulatory Visit

## 2025-03-02 ENCOUNTER — Other Ambulatory Visit: Admitting: Urology

## 2025-03-28 ENCOUNTER — Other Ambulatory Visit

## 2025-06-28 ENCOUNTER — Ambulatory Visit (INDEPENDENT_AMBULATORY_CARE_PROVIDER_SITE_OTHER): Payer: Medicare (Managed Care) | Admitting: Vascular Surgery
